# Patient Record
Sex: Female | Born: 1935
Health system: Southern US, Community
[De-identification: ages and names within clinical notes are randomized; demographics above are authoritative.]

## PROBLEM LIST (undated history)

## (undated) DIAGNOSIS — I219 Acute myocardial infarction, unspecified: Secondary | ICD-10-CM

## (undated) DIAGNOSIS — I10 Essential (primary) hypertension: Secondary | ICD-10-CM

## (undated) DIAGNOSIS — I639 Cerebral infarction, unspecified: Secondary | ICD-10-CM

## (undated) DIAGNOSIS — Z950 Presence of cardiac pacemaker: Secondary | ICD-10-CM

## (undated) HISTORY — PX: APPENDECTOMY: SHX54

## (undated) HISTORY — PX: TONSILLECTOMY: SUR1361

## (undated) HISTORY — DX: Acute myocardial infarction, unspecified: I21.9

## (undated) HISTORY — PX: ABDOMINAL HYSTERECTOMY: SHX81

---

## 2004-09-22 ENCOUNTER — Ambulatory Visit: Payer: Self-pay | Admitting: Internal Medicine

## 2004-11-10 ENCOUNTER — Ambulatory Visit: Payer: Self-pay | Admitting: Internal Medicine

## 2005-12-03 ENCOUNTER — Ambulatory Visit: Payer: Self-pay | Admitting: Internal Medicine

## 2006-03-03 ENCOUNTER — Ambulatory Visit: Payer: Self-pay | Admitting: Internal Medicine

## 2006-05-18 ENCOUNTER — Ambulatory Visit: Payer: Self-pay | Admitting: Gastroenterology

## 2006-12-07 ENCOUNTER — Ambulatory Visit: Payer: Self-pay | Admitting: Internal Medicine

## 2007-03-26 ENCOUNTER — Ambulatory Visit: Payer: Self-pay | Admitting: Internal Medicine

## 2007-10-06 DIAGNOSIS — I219 Acute myocardial infarction, unspecified: Secondary | ICD-10-CM

## 2007-10-06 HISTORY — DX: Acute myocardial infarction, unspecified: I21.9

## 2007-11-25 ENCOUNTER — Other Ambulatory Visit: Payer: Self-pay

## 2007-11-26 ENCOUNTER — Inpatient Hospital Stay: Payer: Self-pay | Admitting: Internal Medicine

## 2007-11-27 ENCOUNTER — Other Ambulatory Visit: Payer: Self-pay

## 2007-11-29 ENCOUNTER — Ambulatory Visit: Payer: Self-pay | Admitting: Cardiology

## 2008-03-15 ENCOUNTER — Ambulatory Visit: Payer: Self-pay | Admitting: Internal Medicine

## 2009-03-18 ENCOUNTER — Ambulatory Visit: Payer: Self-pay | Admitting: Internal Medicine

## 2010-03-20 ENCOUNTER — Ambulatory Visit: Payer: Self-pay | Admitting: Internal Medicine

## 2010-12-29 ENCOUNTER — Ambulatory Visit: Payer: Self-pay | Admitting: Family

## 2011-06-17 ENCOUNTER — Ambulatory Visit: Payer: Self-pay | Admitting: Internal Medicine

## 2011-11-19 DIAGNOSIS — H2510 Age-related nuclear cataract, unspecified eye: Secondary | ICD-10-CM | POA: Diagnosis not present

## 2011-12-07 DIAGNOSIS — I10 Essential (primary) hypertension: Secondary | ICD-10-CM | POA: Diagnosis not present

## 2011-12-07 DIAGNOSIS — I4891 Unspecified atrial fibrillation: Secondary | ICD-10-CM | POA: Diagnosis not present

## 2011-12-07 DIAGNOSIS — R197 Diarrhea, unspecified: Secondary | ICD-10-CM | POA: Diagnosis not present

## 2011-12-10 DIAGNOSIS — D485 Neoplasm of uncertain behavior of skin: Secondary | ICD-10-CM | POA: Diagnosis not present

## 2011-12-10 DIAGNOSIS — Z85828 Personal history of other malignant neoplasm of skin: Secondary | ICD-10-CM | POA: Diagnosis not present

## 2011-12-10 DIAGNOSIS — L570 Actinic keratosis: Secondary | ICD-10-CM | POA: Diagnosis not present

## 2011-12-10 DIAGNOSIS — D045 Carcinoma in situ of skin of trunk: Secondary | ICD-10-CM | POA: Diagnosis not present

## 2011-12-15 ENCOUNTER — Ambulatory Visit: Payer: Self-pay | Admitting: Ophthalmology

## 2011-12-15 DIAGNOSIS — Z8673 Personal history of transient ischemic attack (TIA), and cerebral infarction without residual deficits: Secondary | ICD-10-CM | POA: Diagnosis not present

## 2011-12-15 DIAGNOSIS — Z01812 Encounter for preprocedural laboratory examination: Secondary | ICD-10-CM | POA: Diagnosis not present

## 2011-12-15 DIAGNOSIS — I10 Essential (primary) hypertension: Secondary | ICD-10-CM | POA: Diagnosis not present

## 2011-12-15 DIAGNOSIS — E7800 Pure hypercholesterolemia, unspecified: Secondary | ICD-10-CM | POA: Diagnosis not present

## 2011-12-15 DIAGNOSIS — Z01810 Encounter for preprocedural cardiovascular examination: Secondary | ICD-10-CM | POA: Diagnosis not present

## 2011-12-15 DIAGNOSIS — Z79899 Other long term (current) drug therapy: Secondary | ICD-10-CM | POA: Diagnosis not present

## 2011-12-15 DIAGNOSIS — H2510 Age-related nuclear cataract, unspecified eye: Secondary | ICD-10-CM | POA: Diagnosis not present

## 2011-12-15 DIAGNOSIS — I252 Old myocardial infarction: Secondary | ICD-10-CM | POA: Diagnosis not present

## 2011-12-15 LAB — PROTIME-INR
INR: 2.4
Prothrombin Time: 26.3 secs — ABNORMAL HIGH (ref 11.5–14.7)

## 2011-12-15 LAB — HEMOGLOBIN: HGB: 13.9 g/dL (ref 12.0–16.0)

## 2011-12-25 DIAGNOSIS — C44519 Basal cell carcinoma of skin of other part of trunk: Secondary | ICD-10-CM | POA: Diagnosis not present

## 2011-12-28 ENCOUNTER — Ambulatory Visit: Payer: Self-pay | Admitting: Ophthalmology

## 2011-12-28 DIAGNOSIS — I252 Old myocardial infarction: Secondary | ICD-10-CM | POA: Diagnosis not present

## 2011-12-28 DIAGNOSIS — I498 Other specified cardiac arrhythmias: Secondary | ICD-10-CM | POA: Diagnosis not present

## 2011-12-28 DIAGNOSIS — I10 Essential (primary) hypertension: Secondary | ICD-10-CM | POA: Diagnosis not present

## 2011-12-28 DIAGNOSIS — Z79899 Other long term (current) drug therapy: Secondary | ICD-10-CM | POA: Diagnosis not present

## 2011-12-28 DIAGNOSIS — H2510 Age-related nuclear cataract, unspecified eye: Secondary | ICD-10-CM | POA: Diagnosis not present

## 2011-12-28 DIAGNOSIS — Z7901 Long term (current) use of anticoagulants: Secondary | ICD-10-CM | POA: Diagnosis not present

## 2011-12-28 DIAGNOSIS — K219 Gastro-esophageal reflux disease without esophagitis: Secondary | ICD-10-CM | POA: Diagnosis not present

## 2011-12-28 DIAGNOSIS — R42 Dizziness and giddiness: Secondary | ICD-10-CM | POA: Diagnosis not present

## 2011-12-28 DIAGNOSIS — D649 Anemia, unspecified: Secondary | ICD-10-CM | POA: Diagnosis not present

## 2011-12-28 DIAGNOSIS — E7800 Pure hypercholesterolemia, unspecified: Secondary | ICD-10-CM | POA: Diagnosis not present

## 2011-12-28 DIAGNOSIS — H269 Unspecified cataract: Secondary | ICD-10-CM | POA: Diagnosis not present

## 2011-12-28 DIAGNOSIS — R011 Cardiac murmur, unspecified: Secondary | ICD-10-CM | POA: Diagnosis not present

## 2012-02-16 DIAGNOSIS — I10 Essential (primary) hypertension: Secondary | ICD-10-CM | POA: Diagnosis not present

## 2012-02-16 DIAGNOSIS — E785 Hyperlipidemia, unspecified: Secondary | ICD-10-CM | POA: Diagnosis not present

## 2012-02-16 DIAGNOSIS — I4891 Unspecified atrial fibrillation: Secondary | ICD-10-CM | POA: Diagnosis not present

## 2012-02-18 ENCOUNTER — Ambulatory Visit: Payer: Self-pay | Admitting: Ophthalmology

## 2012-02-18 DIAGNOSIS — Z01812 Encounter for preprocedural laboratory examination: Secondary | ICD-10-CM | POA: Diagnosis not present

## 2012-02-18 DIAGNOSIS — Z7901 Long term (current) use of anticoagulants: Secondary | ICD-10-CM | POA: Diagnosis not present

## 2012-02-18 DIAGNOSIS — H2510 Age-related nuclear cataract, unspecified eye: Secondary | ICD-10-CM | POA: Diagnosis not present

## 2012-02-18 LAB — POTASSIUM: Potassium: 4.8 mmol/L (ref 3.5–5.1)

## 2012-02-18 LAB — PROTIME-INR: INR: 2.6

## 2012-03-14 ENCOUNTER — Ambulatory Visit: Payer: Self-pay | Admitting: Ophthalmology

## 2012-03-14 DIAGNOSIS — Z79899 Other long term (current) drug therapy: Secondary | ICD-10-CM | POA: Diagnosis not present

## 2012-03-14 DIAGNOSIS — R011 Cardiac murmur, unspecified: Secondary | ICD-10-CM | POA: Diagnosis not present

## 2012-03-14 DIAGNOSIS — Z85828 Personal history of other malignant neoplasm of skin: Secondary | ICD-10-CM | POA: Diagnosis not present

## 2012-03-14 DIAGNOSIS — H269 Unspecified cataract: Secondary | ICD-10-CM | POA: Diagnosis not present

## 2012-03-14 DIAGNOSIS — I10 Essential (primary) hypertension: Secondary | ICD-10-CM | POA: Diagnosis not present

## 2012-03-14 DIAGNOSIS — Z7901 Long term (current) use of anticoagulants: Secondary | ICD-10-CM | POA: Diagnosis not present

## 2012-03-14 DIAGNOSIS — I2510 Atherosclerotic heart disease of native coronary artery without angina pectoris: Secondary | ICD-10-CM | POA: Diagnosis not present

## 2012-03-14 DIAGNOSIS — K219 Gastro-esophageal reflux disease without esophagitis: Secondary | ICD-10-CM | POA: Diagnosis not present

## 2012-03-14 DIAGNOSIS — D649 Anemia, unspecified: Secondary | ICD-10-CM | POA: Diagnosis not present

## 2012-03-14 DIAGNOSIS — I252 Old myocardial infarction: Secondary | ICD-10-CM | POA: Diagnosis not present

## 2012-03-14 DIAGNOSIS — Z8673 Personal history of transient ischemic attack (TIA), and cerebral infarction without residual deficits: Secondary | ICD-10-CM | POA: Diagnosis not present

## 2012-03-14 DIAGNOSIS — E7800 Pure hypercholesterolemia, unspecified: Secondary | ICD-10-CM | POA: Diagnosis not present

## 2012-03-14 DIAGNOSIS — H2510 Age-related nuclear cataract, unspecified eye: Secondary | ICD-10-CM | POA: Diagnosis not present

## 2012-03-14 DIAGNOSIS — H9190 Unspecified hearing loss, unspecified ear: Secondary | ICD-10-CM | POA: Diagnosis not present

## 2012-03-16 DIAGNOSIS — E785 Hyperlipidemia, unspecified: Secondary | ICD-10-CM | POA: Diagnosis not present

## 2012-03-16 DIAGNOSIS — I4891 Unspecified atrial fibrillation: Secondary | ICD-10-CM | POA: Diagnosis not present

## 2012-03-16 DIAGNOSIS — I10 Essential (primary) hypertension: Secondary | ICD-10-CM | POA: Diagnosis not present

## 2012-04-12 DIAGNOSIS — I4891 Unspecified atrial fibrillation: Secondary | ICD-10-CM | POA: Diagnosis not present

## 2012-04-12 DIAGNOSIS — E785 Hyperlipidemia, unspecified: Secondary | ICD-10-CM | POA: Diagnosis not present

## 2012-04-12 DIAGNOSIS — I10 Essential (primary) hypertension: Secondary | ICD-10-CM | POA: Diagnosis not present

## 2012-04-12 DIAGNOSIS — E7800 Pure hypercholesterolemia, unspecified: Secondary | ICD-10-CM | POA: Diagnosis not present

## 2012-04-12 DIAGNOSIS — R7301 Impaired fasting glucose: Secondary | ICD-10-CM | POA: Diagnosis not present

## 2012-04-12 DIAGNOSIS — D649 Anemia, unspecified: Secondary | ICD-10-CM | POA: Diagnosis not present

## 2012-05-12 DIAGNOSIS — I10 Essential (primary) hypertension: Secondary | ICD-10-CM | POA: Diagnosis not present

## 2012-05-12 DIAGNOSIS — I4891 Unspecified atrial fibrillation: Secondary | ICD-10-CM | POA: Diagnosis not present

## 2012-05-12 DIAGNOSIS — R21 Rash and other nonspecific skin eruption: Secondary | ICD-10-CM | POA: Diagnosis not present

## 2012-05-20 DIAGNOSIS — Z85828 Personal history of other malignant neoplasm of skin: Secondary | ICD-10-CM | POA: Diagnosis not present

## 2012-05-20 DIAGNOSIS — L570 Actinic keratosis: Secondary | ICD-10-CM | POA: Diagnosis not present

## 2012-05-20 DIAGNOSIS — D045 Carcinoma in situ of skin of trunk: Secondary | ICD-10-CM | POA: Diagnosis not present

## 2012-05-20 DIAGNOSIS — D485 Neoplasm of uncertain behavior of skin: Secondary | ICD-10-CM | POA: Diagnosis not present

## 2012-06-16 DIAGNOSIS — I10 Essential (primary) hypertension: Secondary | ICD-10-CM | POA: Diagnosis not present

## 2012-06-16 DIAGNOSIS — I4891 Unspecified atrial fibrillation: Secondary | ICD-10-CM | POA: Diagnosis not present

## 2012-06-17 ENCOUNTER — Ambulatory Visit: Payer: Self-pay | Admitting: Internal Medicine

## 2012-06-17 DIAGNOSIS — Z1231 Encounter for screening mammogram for malignant neoplasm of breast: Secondary | ICD-10-CM | POA: Diagnosis not present

## 2012-07-01 DIAGNOSIS — L820 Inflamed seborrheic keratosis: Secondary | ICD-10-CM | POA: Diagnosis not present

## 2012-07-01 DIAGNOSIS — D0460 Carcinoma in situ of skin of unspecified upper limb, including shoulder: Secondary | ICD-10-CM | POA: Diagnosis not present

## 2012-07-14 DIAGNOSIS — E785 Hyperlipidemia, unspecified: Secondary | ICD-10-CM | POA: Diagnosis not present

## 2012-07-14 DIAGNOSIS — Z23 Encounter for immunization: Secondary | ICD-10-CM | POA: Diagnosis not present

## 2012-07-14 DIAGNOSIS — I129 Hypertensive chronic kidney disease with stage 1 through stage 4 chronic kidney disease, or unspecified chronic kidney disease: Secondary | ICD-10-CM | POA: Diagnosis not present

## 2012-07-14 DIAGNOSIS — I4891 Unspecified atrial fibrillation: Secondary | ICD-10-CM | POA: Diagnosis not present

## 2012-07-19 DIAGNOSIS — M9981 Other biomechanical lesions of cervical region: Secondary | ICD-10-CM | POA: Diagnosis not present

## 2012-07-19 DIAGNOSIS — M999 Biomechanical lesion, unspecified: Secondary | ICD-10-CM | POA: Diagnosis not present

## 2012-07-19 DIAGNOSIS — IMO0002 Reserved for concepts with insufficient information to code with codable children: Secondary | ICD-10-CM | POA: Diagnosis not present

## 2012-07-19 DIAGNOSIS — M5030 Other cervical disc degeneration, unspecified cervical region: Secondary | ICD-10-CM | POA: Diagnosis not present

## 2012-08-11 DIAGNOSIS — E785 Hyperlipidemia, unspecified: Secondary | ICD-10-CM | POA: Diagnosis not present

## 2012-08-11 DIAGNOSIS — I129 Hypertensive chronic kidney disease with stage 1 through stage 4 chronic kidney disease, or unspecified chronic kidney disease: Secondary | ICD-10-CM | POA: Diagnosis not present

## 2012-08-11 DIAGNOSIS — I4891 Unspecified atrial fibrillation: Secondary | ICD-10-CM | POA: Diagnosis not present

## 2012-08-23 ENCOUNTER — Observation Stay: Payer: Self-pay | Admitting: Internal Medicine

## 2012-08-23 DIAGNOSIS — Z7901 Long term (current) use of anticoagulants: Secondary | ICD-10-CM | POA: Diagnosis not present

## 2012-08-23 DIAGNOSIS — Q211 Atrial septal defect: Secondary | ICD-10-CM | POA: Diagnosis not present

## 2012-08-23 DIAGNOSIS — I959 Hypotension, unspecified: Secondary | ICD-10-CM | POA: Diagnosis not present

## 2012-08-23 DIAGNOSIS — E785 Hyperlipidemia, unspecified: Secondary | ICD-10-CM | POA: Diagnosis not present

## 2012-08-23 DIAGNOSIS — I10 Essential (primary) hypertension: Secondary | ICD-10-CM | POA: Diagnosis not present

## 2012-08-23 DIAGNOSIS — I2510 Atherosclerotic heart disease of native coronary artery without angina pectoris: Secondary | ICD-10-CM | POA: Diagnosis not present

## 2012-08-23 DIAGNOSIS — R404 Transient alteration of awareness: Secondary | ICD-10-CM | POA: Diagnosis not present

## 2012-08-23 DIAGNOSIS — R0602 Shortness of breath: Secondary | ICD-10-CM | POA: Diagnosis not present

## 2012-08-23 DIAGNOSIS — Z66 Do not resuscitate: Secondary | ICD-10-CM | POA: Diagnosis not present

## 2012-08-23 DIAGNOSIS — I059 Rheumatic mitral valve disease, unspecified: Secondary | ICD-10-CM | POA: Diagnosis not present

## 2012-08-23 DIAGNOSIS — I498 Other specified cardiac arrhythmias: Secondary | ICD-10-CM | POA: Diagnosis not present

## 2012-08-23 DIAGNOSIS — R42 Dizziness and giddiness: Secondary | ICD-10-CM | POA: Diagnosis not present

## 2012-08-23 DIAGNOSIS — Z79899 Other long term (current) drug therapy: Secondary | ICD-10-CM | POA: Diagnosis not present

## 2012-08-23 DIAGNOSIS — I129 Hypertensive chronic kidney disease with stage 1 through stage 4 chronic kidney disease, or unspecified chronic kidney disease: Secondary | ICD-10-CM | POA: Diagnosis not present

## 2012-08-23 DIAGNOSIS — I252 Old myocardial infarction: Secondary | ICD-10-CM | POA: Diagnosis not present

## 2012-08-23 DIAGNOSIS — Z8673 Personal history of transient ischemic attack (TIA), and cerebral infarction without residual deficits: Secondary | ICD-10-CM | POA: Diagnosis not present

## 2012-08-23 LAB — CBC
HCT: 40.2 % (ref 35.0–47.0)
HGB: 13.7 g/dL (ref 12.0–16.0)
MCHC: 34.1 g/dL (ref 32.0–36.0)
MCV: 94 fL (ref 80–100)
Platelet: 221 10*3/uL (ref 150–440)
RBC: 4.27 10*6/uL (ref 3.80–5.20)
RDW: 13.3 % (ref 11.5–14.5)
WBC: 8.3 10*3/uL (ref 3.6–11.0)

## 2012-08-23 LAB — COMPREHENSIVE METABOLIC PANEL
Anion Gap: 2 — ABNORMAL LOW (ref 7–16)
Bilirubin,Total: 0.6 mg/dL (ref 0.2–1.0)
Calcium, Total: 9.8 mg/dL (ref 8.5–10.1)
Chloride: 106 mmol/L (ref 98–107)
Co2: 30 mmol/L (ref 21–32)
Creatinine: 1.24 mg/dL (ref 0.60–1.30)
EGFR (African American): 46 — ABNORMAL LOW
EGFR (Non-African Amer.): 40 — ABNORMAL LOW
Osmolality: 280 (ref 275–301)
Potassium: 4.9 mmol/L (ref 3.5–5.1)
SGPT (ALT): 31 U/L (ref 12–78)

## 2012-08-23 LAB — URINALYSIS, COMPLETE
Bacteria: NONE SEEN
Bilirubin,UR: NEGATIVE
Blood: NEGATIVE
Hyaline Cast: 6
Nitrite: NEGATIVE
Ph: 8 (ref 4.5–8.0)
Protein: NEGATIVE
RBC,UR: 6 /HPF (ref 0–5)

## 2012-08-23 LAB — PROTIME-INR: INR: 1.8

## 2012-08-23 LAB — TROPONIN I: Troponin-I: <0.02 ng/mL

## 2012-08-23 LAB — CK TOTAL AND CKMB (NOT AT ARMC): CK, Total: 102 U/L (ref 21–215)

## 2012-08-24 DIAGNOSIS — I959 Hypotension, unspecified: Secondary | ICD-10-CM | POA: Diagnosis not present

## 2012-08-24 DIAGNOSIS — I498 Other specified cardiac arrhythmias: Secondary | ICD-10-CM | POA: Diagnosis not present

## 2012-08-24 DIAGNOSIS — R42 Dizziness and giddiness: Secondary | ICD-10-CM | POA: Diagnosis not present

## 2012-08-24 LAB — BASIC METABOLIC PANEL
Anion Gap: 6 — ABNORMAL LOW (ref 7–16)
BUN: 21 mg/dL — ABNORMAL HIGH (ref 7–18)
Calcium, Total: 8.6 mg/dL (ref 8.5–10.1)
Co2: 26 mmol/L (ref 21–32)
Creatinine: 1.22 mg/dL (ref 0.60–1.30)
EGFR (African American): 47 — ABNORMAL LOW
EGFR (Non-African Amer.): 41 — ABNORMAL LOW
Glucose: 99 mg/dL (ref 65–99)
Potassium: 4.3 mmol/L (ref 3.5–5.1)
Sodium: 140 mmol/L (ref 136–145)

## 2012-08-24 LAB — CBC WITH DIFFERENTIAL/PLATELET
Eosinophil #: 0.2 10*3/uL (ref 0.0–0.7)
HGB: 12.1 g/dL (ref 12.0–16.0)
Lymphocyte #: 2.5 10*3/uL (ref 1.0–3.6)
MCH: 32.2 pg (ref 26.0–34.0)
MCHC: 33.8 g/dL (ref 32.0–36.0)
Monocyte #: 0.6 x10 3/mm (ref 0.2–0.9)
Monocyte %: 7.8 %
Neutrophil %: 53.1 %
Platelet: 198 10*3/uL (ref 150–440)
RDW: 13.4 % (ref 11.5–14.5)
WBC: 7.1 10*3/uL (ref 3.6–11.0)

## 2012-08-30 DIAGNOSIS — R7301 Impaired fasting glucose: Secondary | ICD-10-CM | POA: Diagnosis not present

## 2012-08-30 DIAGNOSIS — I4891 Unspecified atrial fibrillation: Secondary | ICD-10-CM | POA: Diagnosis not present

## 2012-09-05 DIAGNOSIS — I498 Other specified cardiac arrhythmias: Secondary | ICD-10-CM | POA: Diagnosis not present

## 2012-09-09 DIAGNOSIS — R0989 Other specified symptoms and signs involving the circulatory and respiratory systems: Secondary | ICD-10-CM | POA: Diagnosis not present

## 2012-09-09 DIAGNOSIS — R55 Syncope and collapse: Secondary | ICD-10-CM | POA: Diagnosis not present

## 2012-09-09 DIAGNOSIS — I6529 Occlusion and stenosis of unspecified carotid artery: Secondary | ICD-10-CM | POA: Diagnosis not present

## 2012-09-16 DIAGNOSIS — I4891 Unspecified atrial fibrillation: Secondary | ICD-10-CM | POA: Diagnosis not present

## 2012-09-16 DIAGNOSIS — I129 Hypertensive chronic kidney disease with stage 1 through stage 4 chronic kidney disease, or unspecified chronic kidney disease: Secondary | ICD-10-CM | POA: Diagnosis not present

## 2012-10-07 DIAGNOSIS — I4891 Unspecified atrial fibrillation: Secondary | ICD-10-CM | POA: Diagnosis not present

## 2012-10-07 DIAGNOSIS — H1045 Other chronic allergic conjunctivitis: Secondary | ICD-10-CM | POA: Diagnosis not present

## 2012-10-21 DIAGNOSIS — I4891 Unspecified atrial fibrillation: Secondary | ICD-10-CM | POA: Diagnosis not present

## 2012-10-21 DIAGNOSIS — E785 Hyperlipidemia, unspecified: Secondary | ICD-10-CM | POA: Diagnosis not present

## 2012-10-21 DIAGNOSIS — I129 Hypertensive chronic kidney disease with stage 1 through stage 4 chronic kidney disease, or unspecified chronic kidney disease: Secondary | ICD-10-CM | POA: Diagnosis not present

## 2012-11-23 DIAGNOSIS — Z85828 Personal history of other malignant neoplasm of skin: Secondary | ICD-10-CM | POA: Diagnosis not present

## 2012-11-23 DIAGNOSIS — D485 Neoplasm of uncertain behavior of skin: Secondary | ICD-10-CM | POA: Diagnosis not present

## 2012-11-23 DIAGNOSIS — D0470 Carcinoma in situ of skin of unspecified lower limb, including hip: Secondary | ICD-10-CM | POA: Diagnosis not present

## 2012-11-23 DIAGNOSIS — L570 Actinic keratosis: Secondary | ICD-10-CM | POA: Diagnosis not present

## 2012-11-25 DIAGNOSIS — I4891 Unspecified atrial fibrillation: Secondary | ICD-10-CM | POA: Diagnosis not present

## 2012-11-25 DIAGNOSIS — I129 Hypertensive chronic kidney disease with stage 1 through stage 4 chronic kidney disease, or unspecified chronic kidney disease: Secondary | ICD-10-CM | POA: Diagnosis not present

## 2012-11-25 DIAGNOSIS — E785 Hyperlipidemia, unspecified: Secondary | ICD-10-CM | POA: Diagnosis not present

## 2012-12-23 DIAGNOSIS — E785 Hyperlipidemia, unspecified: Secondary | ICD-10-CM | POA: Diagnosis not present

## 2012-12-23 DIAGNOSIS — I4891 Unspecified atrial fibrillation: Secondary | ICD-10-CM | POA: Diagnosis not present

## 2012-12-23 DIAGNOSIS — I129 Hypertensive chronic kidney disease with stage 1 through stage 4 chronic kidney disease, or unspecified chronic kidney disease: Secondary | ICD-10-CM | POA: Diagnosis not present

## 2012-12-30 DIAGNOSIS — H2510 Age-related nuclear cataract, unspecified eye: Secondary | ICD-10-CM | POA: Diagnosis not present

## 2012-12-30 DIAGNOSIS — Z961 Presence of intraocular lens: Secondary | ICD-10-CM | POA: Diagnosis not present

## 2013-01-31 DIAGNOSIS — I129 Hypertensive chronic kidney disease with stage 1 through stage 4 chronic kidney disease, or unspecified chronic kidney disease: Secondary | ICD-10-CM | POA: Diagnosis not present

## 2013-01-31 DIAGNOSIS — I4891 Unspecified atrial fibrillation: Secondary | ICD-10-CM | POA: Diagnosis not present

## 2013-01-31 DIAGNOSIS — E785 Hyperlipidemia, unspecified: Secondary | ICD-10-CM | POA: Diagnosis not present

## 2013-03-02 DIAGNOSIS — I4891 Unspecified atrial fibrillation: Secondary | ICD-10-CM | POA: Diagnosis not present

## 2013-03-02 DIAGNOSIS — E785 Hyperlipidemia, unspecified: Secondary | ICD-10-CM | POA: Diagnosis not present

## 2013-03-02 DIAGNOSIS — I129 Hypertensive chronic kidney disease with stage 1 through stage 4 chronic kidney disease, or unspecified chronic kidney disease: Secondary | ICD-10-CM | POA: Diagnosis not present

## 2013-03-31 DIAGNOSIS — D239 Other benign neoplasm of skin, unspecified: Secondary | ICD-10-CM | POA: Diagnosis not present

## 2013-03-31 DIAGNOSIS — E785 Hyperlipidemia, unspecified: Secondary | ICD-10-CM | POA: Diagnosis not present

## 2013-03-31 DIAGNOSIS — I129 Hypertensive chronic kidney disease with stage 1 through stage 4 chronic kidney disease, or unspecified chronic kidney disease: Secondary | ICD-10-CM | POA: Diagnosis not present

## 2013-03-31 DIAGNOSIS — I4891 Unspecified atrial fibrillation: Secondary | ICD-10-CM | POA: Diagnosis not present

## 2013-04-20 DIAGNOSIS — I129 Hypertensive chronic kidney disease with stage 1 through stage 4 chronic kidney disease, or unspecified chronic kidney disease: Secondary | ICD-10-CM | POA: Diagnosis not present

## 2013-04-27 DIAGNOSIS — Z789 Other specified health status: Secondary | ICD-10-CM | POA: Diagnosis not present

## 2013-04-27 DIAGNOSIS — I129 Hypertensive chronic kidney disease with stage 1 through stage 4 chronic kidney disease, or unspecified chronic kidney disease: Secondary | ICD-10-CM | POA: Diagnosis not present

## 2013-04-27 DIAGNOSIS — I4891 Unspecified atrial fibrillation: Secondary | ICD-10-CM | POA: Diagnosis not present

## 2013-05-25 DIAGNOSIS — L570 Actinic keratosis: Secondary | ICD-10-CM | POA: Diagnosis not present

## 2013-05-25 DIAGNOSIS — L130 Dermatitis herpetiformis: Secondary | ICD-10-CM | POA: Diagnosis not present

## 2013-05-25 DIAGNOSIS — Z85828 Personal history of other malignant neoplasm of skin: Secondary | ICD-10-CM | POA: Diagnosis not present

## 2013-05-25 DIAGNOSIS — D485 Neoplasm of uncertain behavior of skin: Secondary | ICD-10-CM | POA: Diagnosis not present

## 2013-05-25 DIAGNOSIS — C44621 Squamous cell carcinoma of skin of unspecified upper limb, including shoulder: Secondary | ICD-10-CM | POA: Diagnosis not present

## 2013-05-30 DIAGNOSIS — I4891 Unspecified atrial fibrillation: Secondary | ICD-10-CM | POA: Diagnosis not present

## 2013-05-30 DIAGNOSIS — I129 Hypertensive chronic kidney disease with stage 1 through stage 4 chronic kidney disease, or unspecified chronic kidney disease: Secondary | ICD-10-CM | POA: Diagnosis not present

## 2013-05-30 DIAGNOSIS — C4490 Unspecified malignant neoplasm of skin, unspecified: Secondary | ICD-10-CM | POA: Diagnosis not present

## 2013-06-01 DIAGNOSIS — D0460 Carcinoma in situ of skin of unspecified upper limb, including shoulder: Secondary | ICD-10-CM | POA: Diagnosis not present

## 2013-06-23 ENCOUNTER — Ambulatory Visit: Payer: Self-pay | Admitting: Family

## 2013-06-23 DIAGNOSIS — Z1231 Encounter for screening mammogram for malignant neoplasm of breast: Secondary | ICD-10-CM | POA: Diagnosis not present

## 2013-06-30 DIAGNOSIS — L570 Actinic keratosis: Secondary | ICD-10-CM | POA: Diagnosis not present

## 2013-07-04 DIAGNOSIS — Z23 Encounter for immunization: Secondary | ICD-10-CM | POA: Diagnosis not present

## 2013-07-04 DIAGNOSIS — I129 Hypertensive chronic kidney disease with stage 1 through stage 4 chronic kidney disease, or unspecified chronic kidney disease: Secondary | ICD-10-CM | POA: Diagnosis not present

## 2013-07-04 DIAGNOSIS — I4891 Unspecified atrial fibrillation: Secondary | ICD-10-CM | POA: Diagnosis not present

## 2013-07-04 DIAGNOSIS — Z833 Family history of diabetes mellitus: Secondary | ICD-10-CM | POA: Diagnosis not present

## 2013-07-28 DIAGNOSIS — L570 Actinic keratosis: Secondary | ICD-10-CM | POA: Diagnosis not present

## 2013-08-04 DIAGNOSIS — L820 Inflamed seborrheic keratosis: Secondary | ICD-10-CM | POA: Diagnosis not present

## 2013-08-08 DIAGNOSIS — Z789 Other specified health status: Secondary | ICD-10-CM | POA: Diagnosis not present

## 2013-08-08 DIAGNOSIS — I4891 Unspecified atrial fibrillation: Secondary | ICD-10-CM | POA: Diagnosis not present

## 2013-09-07 DIAGNOSIS — I129 Hypertensive chronic kidney disease with stage 1 through stage 4 chronic kidney disease, or unspecified chronic kidney disease: Secondary | ICD-10-CM | POA: Diagnosis not present

## 2013-09-07 DIAGNOSIS — E785 Hyperlipidemia, unspecified: Secondary | ICD-10-CM | POA: Diagnosis not present

## 2013-09-07 DIAGNOSIS — I4891 Unspecified atrial fibrillation: Secondary | ICD-10-CM | POA: Diagnosis not present

## 2013-09-15 ENCOUNTER — Emergency Department: Payer: Self-pay | Admitting: Emergency Medicine

## 2013-09-15 DIAGNOSIS — S025XXA Fracture of tooth (traumatic), initial encounter for closed fracture: Secondary | ICD-10-CM | POA: Diagnosis not present

## 2013-09-15 DIAGNOSIS — S52123A Displaced fracture of head of unspecified radius, initial encounter for closed fracture: Secondary | ICD-10-CM | POA: Diagnosis not present

## 2013-09-15 DIAGNOSIS — M25569 Pain in unspecified knee: Secondary | ICD-10-CM | POA: Diagnosis not present

## 2013-09-15 DIAGNOSIS — Z79899 Other long term (current) drug therapy: Secondary | ICD-10-CM | POA: Diagnosis not present

## 2013-09-15 DIAGNOSIS — R42 Dizziness and giddiness: Secondary | ICD-10-CM | POA: Diagnosis not present

## 2013-09-15 DIAGNOSIS — S8990XA Unspecified injury of unspecified lower leg, initial encounter: Secondary | ICD-10-CM | POA: Diagnosis not present

## 2013-09-15 DIAGNOSIS — S0990XA Unspecified injury of head, initial encounter: Secondary | ICD-10-CM | POA: Diagnosis not present

## 2013-09-15 DIAGNOSIS — R197 Diarrhea, unspecified: Secondary | ICD-10-CM | POA: Diagnosis not present

## 2013-09-15 DIAGNOSIS — S5000XA Contusion of unspecified elbow, initial encounter: Secondary | ICD-10-CM | POA: Diagnosis not present

## 2013-09-15 DIAGNOSIS — S01501A Unspecified open wound of lip, initial encounter: Secondary | ICD-10-CM | POA: Diagnosis not present

## 2013-09-15 DIAGNOSIS — S4980XA Other specified injuries of shoulder and upper arm, unspecified arm, initial encounter: Secondary | ICD-10-CM | POA: Diagnosis not present

## 2013-09-15 DIAGNOSIS — I10 Essential (primary) hypertension: Secondary | ICD-10-CM | POA: Diagnosis not present

## 2013-09-15 DIAGNOSIS — M25519 Pain in unspecified shoulder: Secondary | ICD-10-CM | POA: Diagnosis not present

## 2013-09-15 DIAGNOSIS — S52109A Unspecified fracture of upper end of unspecified radius, initial encounter for closed fracture: Secondary | ICD-10-CM | POA: Diagnosis not present

## 2013-09-19 DIAGNOSIS — S52023A Displaced fracture of olecranon process without intraarticular extension of unspecified ulna, initial encounter for closed fracture: Secondary | ICD-10-CM | POA: Diagnosis not present

## 2013-10-04 DIAGNOSIS — S52023A Displaced fracture of olecranon process without intraarticular extension of unspecified ulna, initial encounter for closed fracture: Secondary | ICD-10-CM | POA: Diagnosis not present

## 2013-10-11 DIAGNOSIS — S42309S Unspecified fracture of shaft of humerus, unspecified arm, sequela: Secondary | ICD-10-CM | POA: Diagnosis not present

## 2013-10-11 DIAGNOSIS — N183 Chronic kidney disease, stage 3 unspecified: Secondary | ICD-10-CM | POA: Diagnosis not present

## 2013-10-11 DIAGNOSIS — I129 Hypertensive chronic kidney disease with stage 1 through stage 4 chronic kidney disease, or unspecified chronic kidney disease: Secondary | ICD-10-CM | POA: Diagnosis not present

## 2013-10-11 DIAGNOSIS — I4891 Unspecified atrial fibrillation: Secondary | ICD-10-CM | POA: Diagnosis not present

## 2013-11-16 DIAGNOSIS — N183 Chronic kidney disease, stage 3 unspecified: Secondary | ICD-10-CM | POA: Diagnosis not present

## 2013-11-16 DIAGNOSIS — I129 Hypertensive chronic kidney disease with stage 1 through stage 4 chronic kidney disease, or unspecified chronic kidney disease: Secondary | ICD-10-CM | POA: Diagnosis not present

## 2013-11-16 DIAGNOSIS — S42309S Unspecified fracture of shaft of humerus, unspecified arm, sequela: Secondary | ICD-10-CM | POA: Diagnosis not present

## 2013-11-16 DIAGNOSIS — Z7901 Long term (current) use of anticoagulants: Secondary | ICD-10-CM | POA: Diagnosis not present

## 2013-11-16 DIAGNOSIS — I4891 Unspecified atrial fibrillation: Secondary | ICD-10-CM | POA: Diagnosis not present

## 2013-12-19 DIAGNOSIS — S52023A Displaced fracture of olecranon process without intraarticular extension of unspecified ulna, initial encounter for closed fracture: Secondary | ICD-10-CM | POA: Diagnosis not present

## 2013-12-19 DIAGNOSIS — M67919 Unspecified disorder of synovium and tendon, unspecified shoulder: Secondary | ICD-10-CM | POA: Diagnosis not present

## 2013-12-27 DIAGNOSIS — Z85828 Personal history of other malignant neoplasm of skin: Secondary | ICD-10-CM | POA: Diagnosis not present

## 2013-12-27 DIAGNOSIS — L570 Actinic keratosis: Secondary | ICD-10-CM | POA: Diagnosis not present

## 2014-01-02 DIAGNOSIS — I4891 Unspecified atrial fibrillation: Secondary | ICD-10-CM | POA: Diagnosis not present

## 2014-01-02 DIAGNOSIS — Z7901 Long term (current) use of anticoagulants: Secondary | ICD-10-CM | POA: Diagnosis not present

## 2014-02-06 DIAGNOSIS — D638 Anemia in other chronic diseases classified elsewhere: Secondary | ICD-10-CM | POA: Diagnosis not present

## 2014-02-06 DIAGNOSIS — I209 Angina pectoris, unspecified: Secondary | ICD-10-CM | POA: Diagnosis not present

## 2014-02-06 DIAGNOSIS — I4891 Unspecified atrial fibrillation: Secondary | ICD-10-CM | POA: Diagnosis not present

## 2014-02-06 DIAGNOSIS — I10 Essential (primary) hypertension: Secondary | ICD-10-CM | POA: Diagnosis not present

## 2014-03-09 DIAGNOSIS — I209 Angina pectoris, unspecified: Secondary | ICD-10-CM | POA: Diagnosis not present

## 2014-03-09 DIAGNOSIS — E8881 Metabolic syndrome: Secondary | ICD-10-CM | POA: Diagnosis not present

## 2014-03-09 DIAGNOSIS — I10 Essential (primary) hypertension: Secondary | ICD-10-CM | POA: Diagnosis not present

## 2014-03-09 DIAGNOSIS — I4891 Unspecified atrial fibrillation: Secondary | ICD-10-CM | POA: Diagnosis not present

## 2014-03-09 DIAGNOSIS — Z7901 Long term (current) use of anticoagulants: Secondary | ICD-10-CM | POA: Diagnosis not present

## 2014-03-15 DIAGNOSIS — L259 Unspecified contact dermatitis, unspecified cause: Secondary | ICD-10-CM | POA: Diagnosis not present

## 2014-03-15 DIAGNOSIS — L570 Actinic keratosis: Secondary | ICD-10-CM | POA: Diagnosis not present

## 2014-03-15 DIAGNOSIS — L821 Other seborrheic keratosis: Secondary | ICD-10-CM | POA: Diagnosis not present

## 2014-04-05 DIAGNOSIS — I10 Essential (primary) hypertension: Secondary | ICD-10-CM | POA: Diagnosis not present

## 2014-04-05 DIAGNOSIS — I4891 Unspecified atrial fibrillation: Secondary | ICD-10-CM | POA: Diagnosis not present

## 2014-04-05 DIAGNOSIS — I129 Hypertensive chronic kidney disease with stage 1 through stage 4 chronic kidney disease, or unspecified chronic kidney disease: Secondary | ICD-10-CM | POA: Diagnosis not present

## 2014-04-05 DIAGNOSIS — E8881 Metabolic syndrome: Secondary | ICD-10-CM | POA: Diagnosis not present

## 2014-05-07 DIAGNOSIS — I209 Angina pectoris, unspecified: Secondary | ICD-10-CM | POA: Diagnosis not present

## 2014-05-07 DIAGNOSIS — I4891 Unspecified atrial fibrillation: Secondary | ICD-10-CM | POA: Diagnosis not present

## 2014-05-07 DIAGNOSIS — E8881 Metabolic syndrome: Secondary | ICD-10-CM | POA: Diagnosis not present

## 2014-05-07 DIAGNOSIS — I10 Essential (primary) hypertension: Secondary | ICD-10-CM | POA: Diagnosis not present

## 2014-05-07 DIAGNOSIS — Z7901 Long term (current) use of anticoagulants: Secondary | ICD-10-CM | POA: Diagnosis not present

## 2014-06-07 DIAGNOSIS — I209 Angina pectoris, unspecified: Secondary | ICD-10-CM | POA: Diagnosis not present

## 2014-06-07 DIAGNOSIS — E785 Hyperlipidemia, unspecified: Secondary | ICD-10-CM | POA: Diagnosis not present

## 2014-06-07 DIAGNOSIS — I4891 Unspecified atrial fibrillation: Secondary | ICD-10-CM | POA: Diagnosis not present

## 2014-06-21 DIAGNOSIS — Z23 Encounter for immunization: Secondary | ICD-10-CM | POA: Diagnosis not present

## 2014-06-22 IMAGING — CR DG KNEE COMPLETE 4+V*L*
1 series · 4 of 4 positions shown · non-contrast
Comparison: None.

CLINICAL DATA: Left knee pain after fall.

EXAM:
LEFT KNEE - COMPLETE 4+ VIEW

[Series 1: x knee ap left · 0.14mm/px · 4 of 4 slices shown]
[im 1/4]
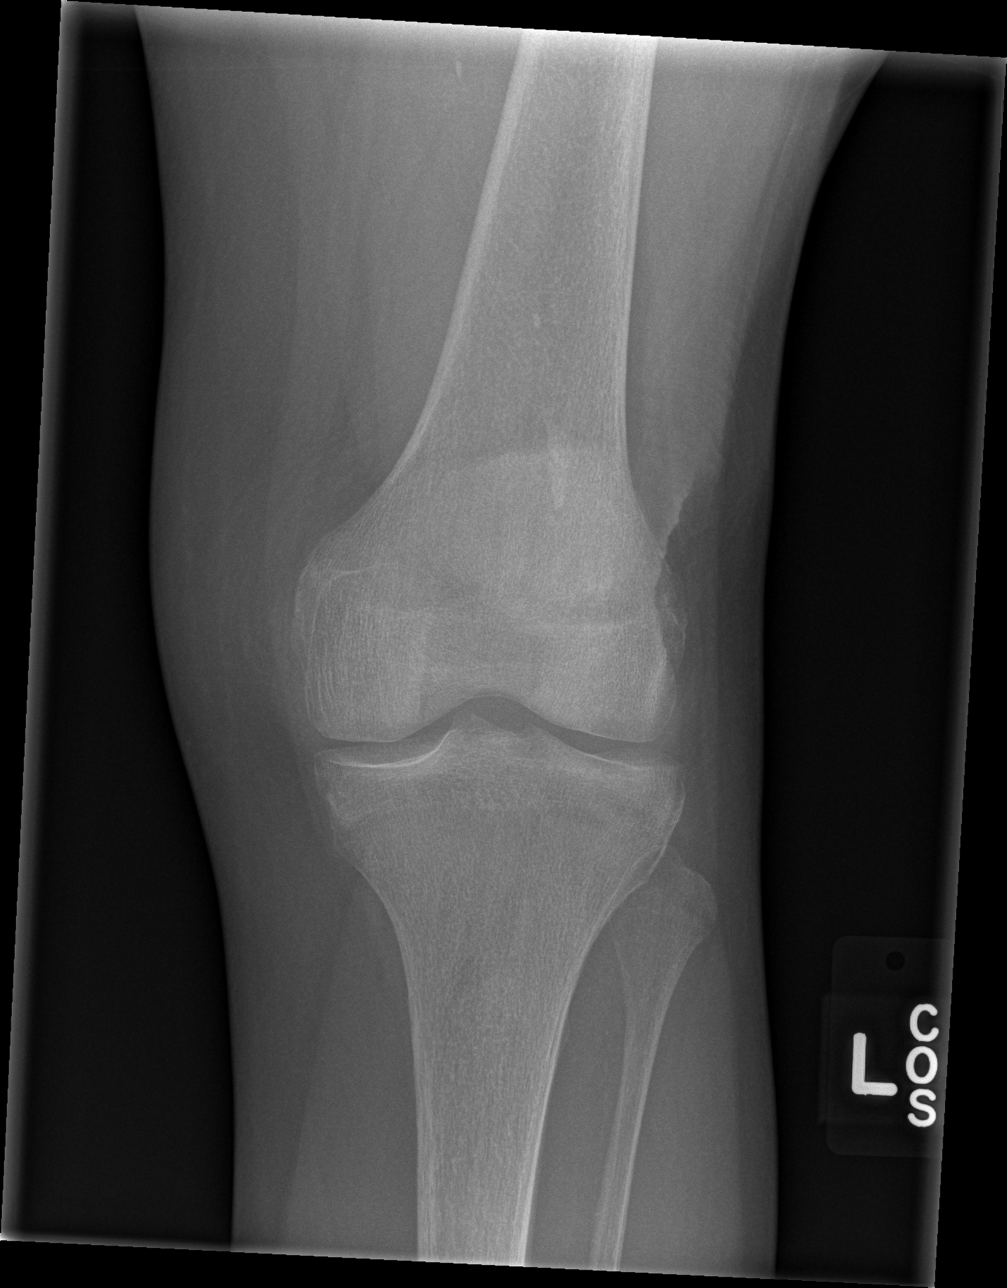
[im 2/4]
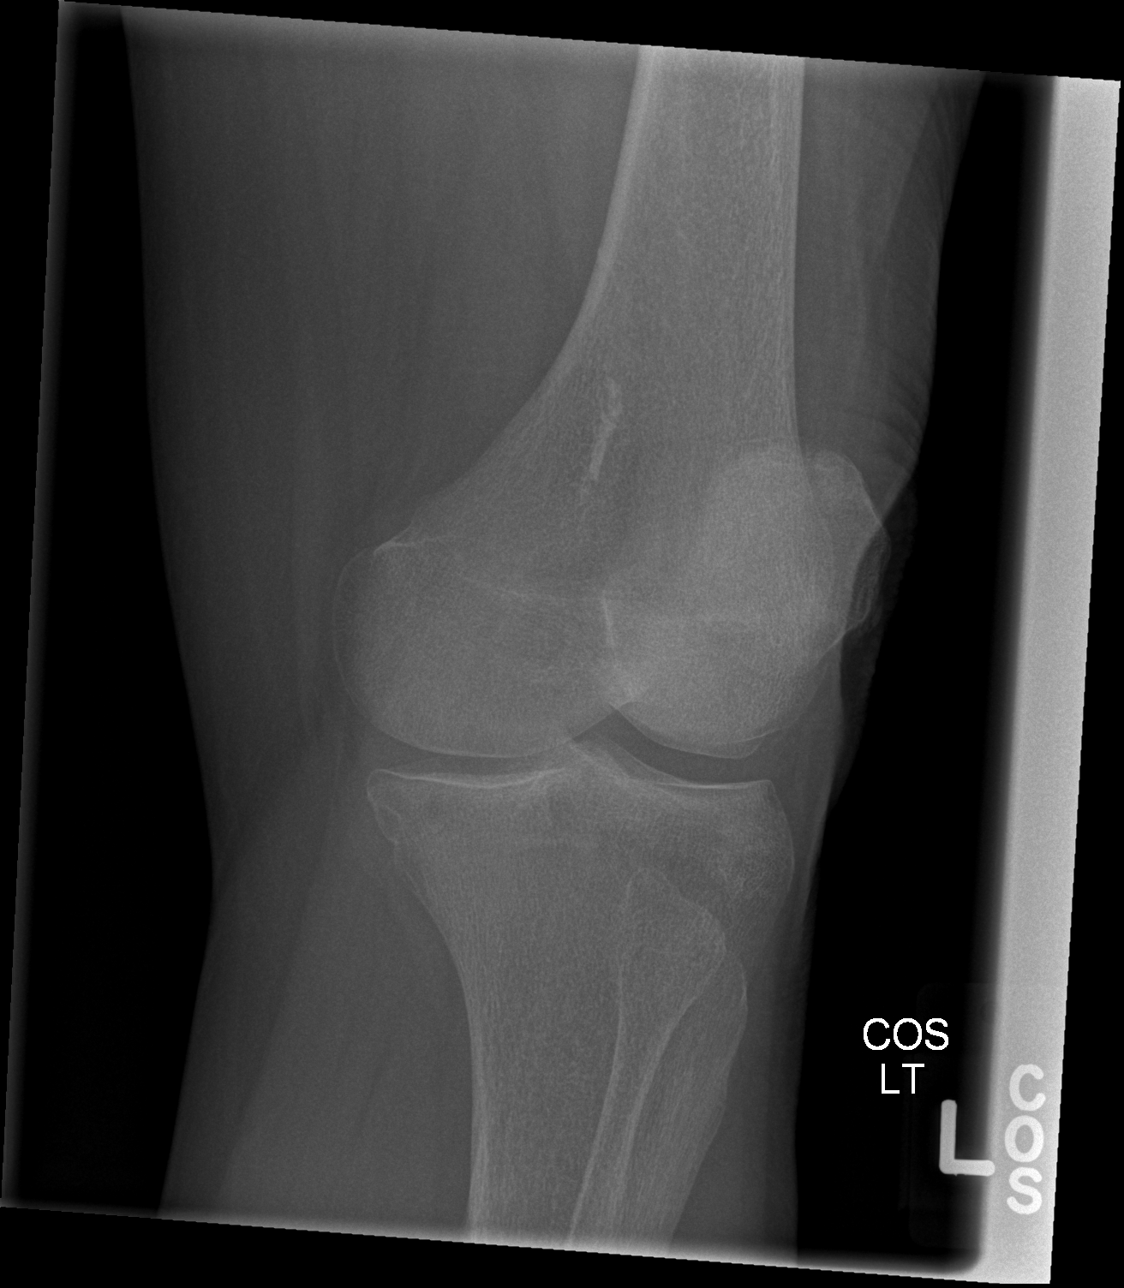
[im 3/4]
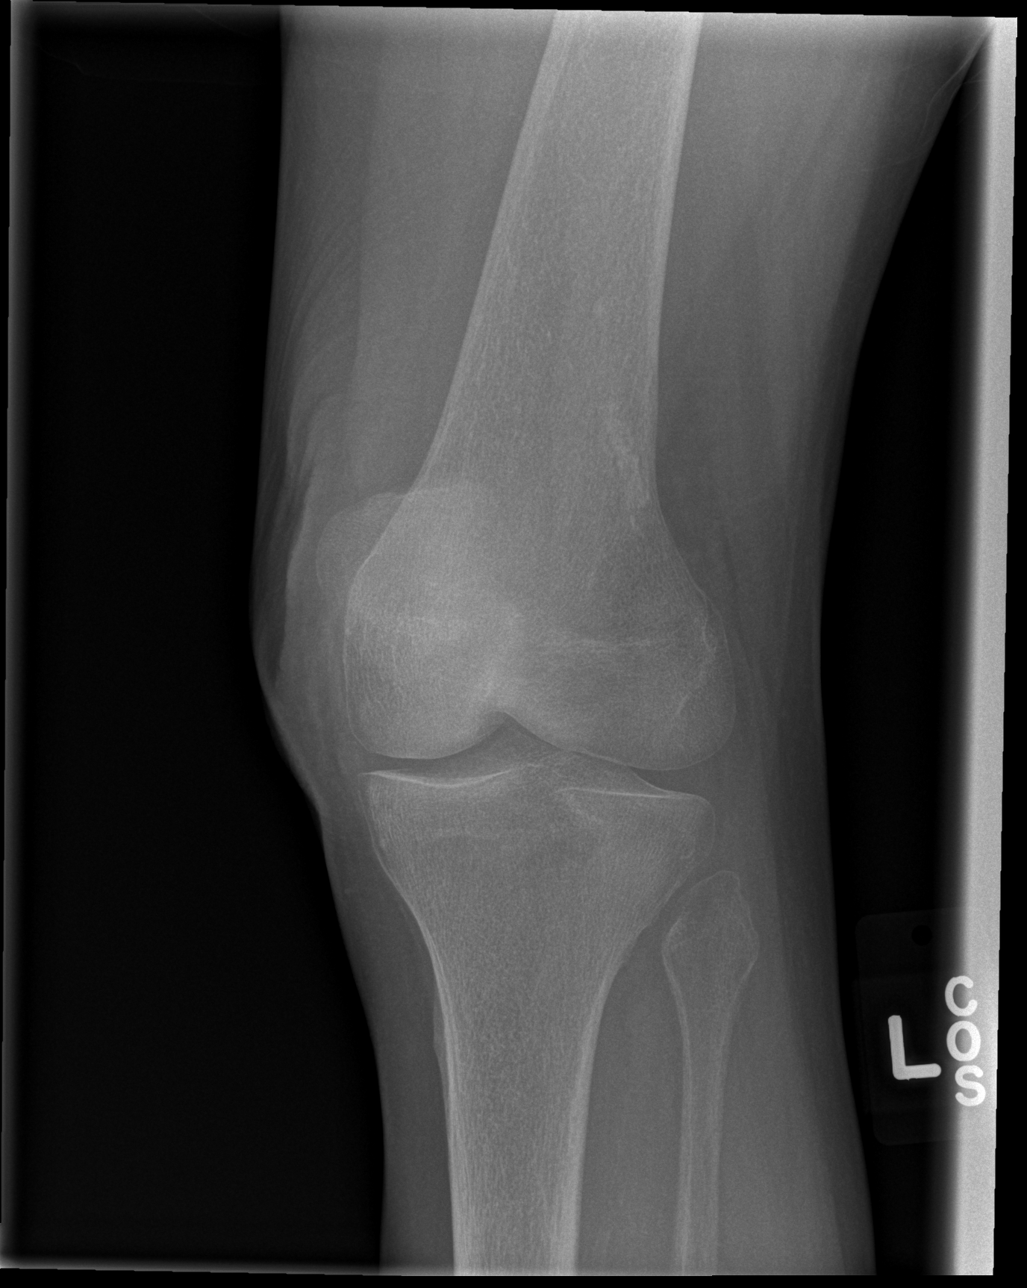
[im 4/4]
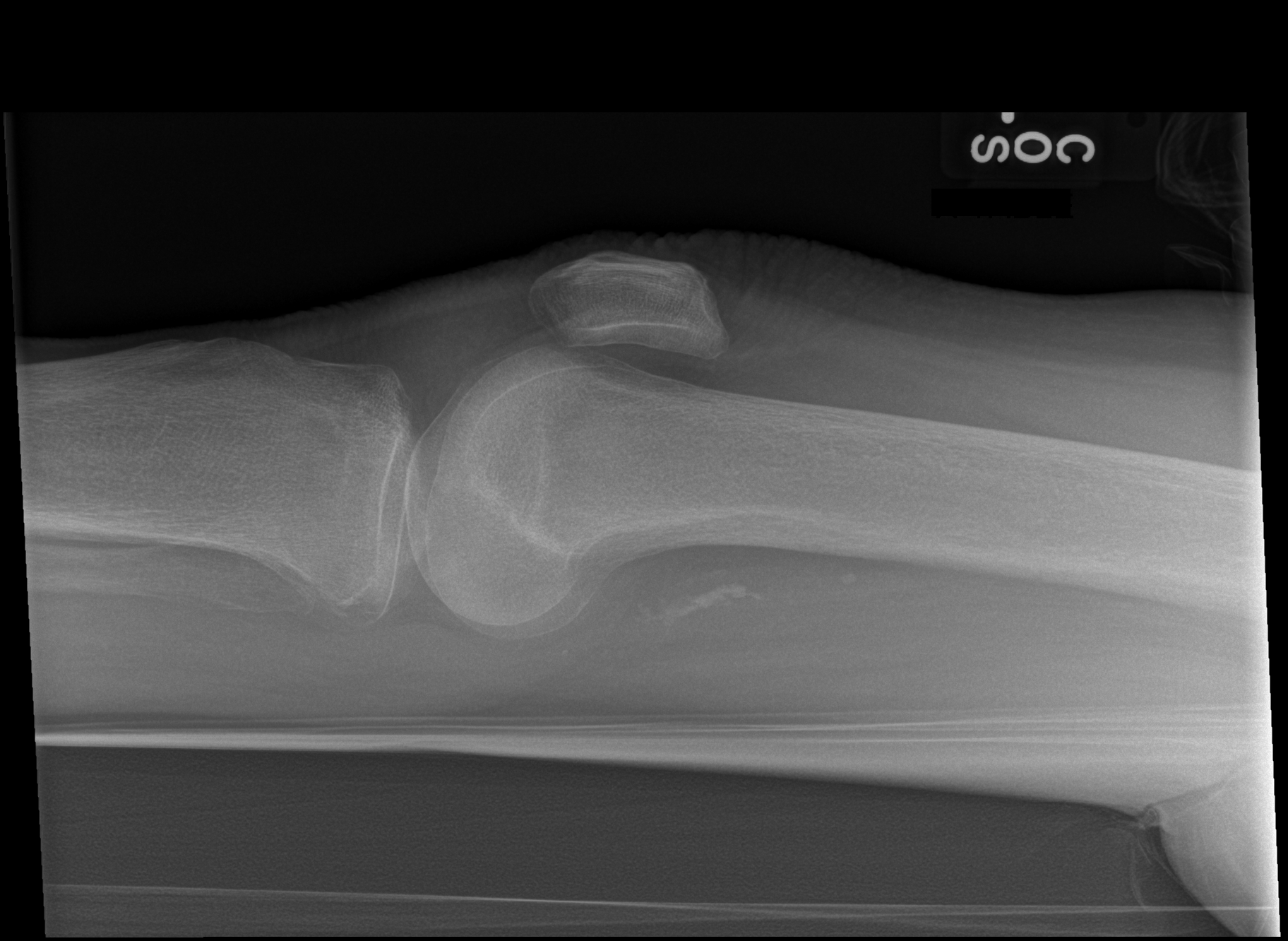

[4 of 4 positions shown; findings below may reference images not displayed]

FINDINGS: There is no evidence of fracture, dislocation, or joint effusion.
Mild osteopenia is noted. There is no evidence of arthropathy or
other focal bone abnormality. Soft tissues are unremarkable.
IMPRESSION: No significant abnormality seen in the left knee.

## 2014-07-12 DIAGNOSIS — I10 Essential (primary) hypertension: Secondary | ICD-10-CM | POA: Diagnosis not present

## 2014-07-12 DIAGNOSIS — I4891 Unspecified atrial fibrillation: Secondary | ICD-10-CM | POA: Diagnosis not present

## 2014-07-12 DIAGNOSIS — Z7901 Long term (current) use of anticoagulants: Secondary | ICD-10-CM | POA: Diagnosis not present

## 2014-08-13 DIAGNOSIS — E784 Other hyperlipidemia: Secondary | ICD-10-CM | POA: Diagnosis not present

## 2014-08-13 DIAGNOSIS — I4891 Unspecified atrial fibrillation: Secondary | ICD-10-CM | POA: Diagnosis not present

## 2014-08-13 DIAGNOSIS — Z7901 Long term (current) use of anticoagulants: Secondary | ICD-10-CM | POA: Diagnosis not present

## 2014-08-13 DIAGNOSIS — I10 Essential (primary) hypertension: Secondary | ICD-10-CM | POA: Diagnosis not present

## 2014-09-12 DIAGNOSIS — I4891 Unspecified atrial fibrillation: Secondary | ICD-10-CM | POA: Diagnosis not present

## 2014-09-12 DIAGNOSIS — Z7901 Long term (current) use of anticoagulants: Secondary | ICD-10-CM | POA: Diagnosis not present

## 2014-09-12 DIAGNOSIS — I10 Essential (primary) hypertension: Secondary | ICD-10-CM | POA: Diagnosis not present

## 2014-09-13 DIAGNOSIS — I10 Essential (primary) hypertension: Secondary | ICD-10-CM | POA: Diagnosis not present

## 2014-09-13 DIAGNOSIS — D638 Anemia in other chronic diseases classified elsewhere: Secondary | ICD-10-CM | POA: Diagnosis not present

## 2014-09-13 DIAGNOSIS — E669 Obesity, unspecified: Secondary | ICD-10-CM | POA: Diagnosis not present

## 2014-09-13 DIAGNOSIS — E784 Other hyperlipidemia: Secondary | ICD-10-CM | POA: Diagnosis not present

## 2014-09-13 DIAGNOSIS — E8881 Metabolic syndrome: Secondary | ICD-10-CM | POA: Diagnosis not present

## 2014-09-19 DIAGNOSIS — I10 Essential (primary) hypertension: Secondary | ICD-10-CM | POA: Diagnosis not present

## 2014-09-19 DIAGNOSIS — I4891 Unspecified atrial fibrillation: Secondary | ICD-10-CM | POA: Diagnosis not present

## 2014-09-19 DIAGNOSIS — E8881 Metabolic syndrome: Secondary | ICD-10-CM | POA: Diagnosis not present

## 2014-10-19 DIAGNOSIS — Z1389 Encounter for screening for other disorder: Secondary | ICD-10-CM | POA: Diagnosis not present

## 2014-10-19 DIAGNOSIS — I4891 Unspecified atrial fibrillation: Secondary | ICD-10-CM | POA: Diagnosis not present

## 2014-10-19 DIAGNOSIS — Z7901 Long term (current) use of anticoagulants: Secondary | ICD-10-CM | POA: Diagnosis not present

## 2014-10-19 DIAGNOSIS — I10 Essential (primary) hypertension: Secondary | ICD-10-CM | POA: Diagnosis not present

## 2014-10-19 DIAGNOSIS — E8881 Metabolic syndrome: Secondary | ICD-10-CM | POA: Diagnosis not present

## 2015-01-07 DIAGNOSIS — E784 Other hyperlipidemia: Secondary | ICD-10-CM | POA: Diagnosis not present

## 2015-01-07 DIAGNOSIS — E8881 Metabolic syndrome: Secondary | ICD-10-CM | POA: Diagnosis not present

## 2015-01-07 DIAGNOSIS — I4891 Unspecified atrial fibrillation: Secondary | ICD-10-CM | POA: Diagnosis not present

## 2015-01-07 DIAGNOSIS — I10 Essential (primary) hypertension: Secondary | ICD-10-CM | POA: Diagnosis not present

## 2015-01-22 DIAGNOSIS — M7071 Other bursitis of hip, right hip: Secondary | ICD-10-CM | POA: Diagnosis not present

## 2015-01-22 DIAGNOSIS — M5137 Other intervertebral disc degeneration, lumbosacral region: Secondary | ICD-10-CM | POA: Diagnosis not present

## 2015-01-22 DIAGNOSIS — M9904 Segmental and somatic dysfunction of sacral region: Secondary | ICD-10-CM | POA: Diagnosis not present

## 2015-01-22 NOTE — Discharge Summary (Signed)
PATIENT NAME:  Kimberly Hood, Kimberly Hood MR#:  [id number] DATE OF BIRTH:  [birth year]  DATE OF ADMISSION:  08/23/2012 DATE OF DISCHARGE:  08/24/2012  PRIMARY CARE PHYSICIAN: Cletis Athens, MD   DISCHARGE DIAGNOSES:  1. Brief episodes of dizziness.  2. Bradycardia and hypotension.  3. Hyperlipidemia.  4. History of cerebrovascular accident and coronary artery disease.  5. Chronic kidney disease, stage 3.   MEDICATIONS ON DISCHARGE:  1. Zetia 10 mg daily.  2. Furosemide 20 mg daily.  3. Lisinopril 40 mg daily.  4. Potassium chloride 20 mEq daily.  5. Simvastatin 40 mg daily.  6. Warfarin 4 mg daily.  7. Ferrex 150, one capsule daily.  8. Centrum Silver one tablet daily.  9. Osteo-Health one capsule twice a day.  10. Omega-Q Plus one capsule twice a day.   NOTE: Stop taking metoprolol 100 mg daily.   DIET: Regular consistency.   ACTIVITY: Activity as tolerated.   FOLLOWUP: Follow up with Dr. Lavera Guise in five to six days.   REASON FOR ADMISSION: The patient was admitted 08/23/2012 and discharged 08/24/2012. The patient came in with some brief episodes of dizziness. The patient was admitted to the observation area. The patient had sinus bradycardia and was placed on off-unit telemetry. Heart rate was down in the 40s. Beta blocker was held.   LABORATORY, DIAGNOSTIC AND RADIOLOGICAL DATA: EKG showed sinus bradycardia, premature atrial complex, 48 beats per minute. Troponin negative. INR 1.8. Glucose 123, BUN 22, creatinine 1.24, sodium 138, potassium 4.9, chloride 106, CO2 30, calcium 9.8. Liver function tests normal. White blood cell count 8.3, hemoglobin and hematocrit 13.7 and 40.2, platelet count of 221,000. Urinalysis trace leukocyte esterase, otherwise negative. Chest x-ray showed mild hyperinflation, prominent atherosclerotic disease. Hemoglobin A1c is 6.0. Repeat hemoglobin is 12.1. Repeat creatinine is 1.2. Echocardiogram showed calcified mitral apparatus. Mitral valve leaflets appear thickened  but opened well. Hypertrophied left ventricle with good left ventricular function, ejection fraction 40 to 45%. No thrombus. Borderline right atrial enlargement. Telemetry showed sinus bradycardia, no arrhythmias.   HOSPITAL COURSE PER PROBLEM LIST:  1. Brief episodes of dizziness: These only lasted seconds at a time. I do not put much emphasis on this. I do not believe that the bradycardia had caused this, but her metoprolol was stopped. Close clinical follow-up with Dr. Lavera Guise as outpatient is needed.  2. Bradycardia and relative hypotension: The patient was given IV fluids overnight. Metoprolol was stopped. Her other medications were held initially. The patient was not orthostatic. Blood pressure upon discharge was 149/73. The patient can restart her lisinopril and Lasix as outpatient. Heart rate 63. Can consider a lower dose of metoprolol as outpatient if indicated by Dr. Lavera Guise.  3. Hyperlipidemia: Continue simvastatin and Zetia.  4. History of cerebrovascular accident and coronary artery disease: Beta blocker now contraindicated with bradycardia. Recheck heart rate as outpatient. May be able to start a lower dose. The patient is therapeutic on Coumadin. INR is slightly low at 1.8. Follow up as outpatient.  5. Chronic kidney disease, stage III: Did not improve with IV fluid hydration. I believe this is chronic in nature. Can continue lisinopril.   TIME SPENT ON DISCHARGE: 35 minutes.  ____________________________ Tana Conch. Leslye Peer, MD rjw:cbb D: 08/24/2012 15:43:58 ET T: 08/25/2012 11:38:08 ET JOB#: [id number] cc: Tana Conch. Leslye Peer, MD, <Dictator> Cletis Athens, MD Marisue Brooklyn MD ELECTRONICALLY SIGNED 09/05/2012 17:28

## 2015-01-22 NOTE — H&P (Signed)
PATIENT NAME:  Kimberly Hood, Kimberly Hood MR#:  [id number] DATE OF BIRTH:  [birth year]  DATE OF ADMISSION:  08/23/2012  REFERRING PHYSICIAN: Dr. Mariea Clonts    PRIMARY CARE PHYSICIAN: Dr. Lavera Guise  CHIEF COMPLAINT: Dizziness.   HISTORY OF PRESENT ILLNESS: The patient is a pleasant, 81 year old Caucasian female with history of multi-infarct cerebrovascular accident in 2009 secondary to embolic phenomenon and NSTEMI, hypertension, who presents with three episodes of dizziness between 9 and 9:30. They were all brief, lasting several seconds, and the patient had to hold herself steady in order not to fall. The patient had no fall, loss of consciousness, chest pain, or palpitations. The patient does endorse positional dizziness that has been ongoing for 2 to 3 years. The patient denies having any new medications that were recently started. The patient has no pains in the chest. While in the ER per ER physician she was noted to be sinus brady down to 40s. Currently she is in the 60s and sinus. Hospitalist services were contacted for further evaluation and management. The patient is of note on metoprolol.   PAST MEDICAL HISTORY:  1. History of embolic multi-infarct cerebrovascular accident in 2009. 2. NSTEMI. 3. Mitral regurgitation. 4. Hypertension. 5. Hyperlipidemia.  6. Possible small PFO.   PAST SURGICAL HISTORY: Appendectomy and cataract surgery.   ALLERGIES: No known drug allergies.   FAMILY HISTORY: CVA and heart disease in both parents and also a brother.   OUTPATIENT MEDICATIONS:  1. Centrum Silver 1 tab daily.  2. Ferrex 150  1 cap once a day.  3. Furosemide 20 mg daily.  4. Lisinopril 40 mg daily.   5. Metoprolol tartrate 100 mg once a day.  6. Omega Q plus 1 tab 2 times a day. 7. Osteovite  1 tab 2 times a day.  8. Potassium chloride 20 mEq daily.  9. Simvastatin 40 mg daily.  10. Warfarin 4 mg daily.  11. Zetia 10 mg once a day.   REVIEW OF SYSTEMS: CONSTITUTIONAL: No fever or fatigue or  weakness. No weight changes. EYES: Has history of cataract removal and seeing white flashes for several years. ENT: No tinnitus, hearing loss, snoring, or postnasal drip. RESPIRATORY: No cough, wheezing, hemoptysis, or shortness of breath. CARDIOVASCULAR: No chest pain. No orthopnea. No history of arrhythmia in the past.  History of non-ST-elevation myocardial infarction in the past.  GENITOURINARY: Denies dysuria or hematuria. Some increased frequency last night. ENDOCRINE: No polyuria or nocturia or thyroid problems. HEME/LYMPH: No anemia or easy bruising. SKIN: No new rashes. MUSCULOSKELETAL: Denies arthritis or joint pains.  PSYCH: No anxiety or insomnia. NEURO: History of CVA in the past.   PHYSICAL EXAMINATION:  VITAL SIGNS: Temperature on arrival 97.5, pulse rate 51, respiratory rate 20, blood pressure 148/67, oxygen saturation 99% on room air. Orthostatic vitals were done and the patient is not orthostatic.   GENERAL: The patient is awake, alert, and oriented times three, lying in bed in no obvious distress.   HEENT: Normocephalic, atraumatic. Pupils are equal and reactive. Anicteric sclerae. Extraocular muscles intact.   NECK: Supple. No cervical lymphadenopathy or thyroid tenderness. Moist mucous membranes.   CARDIOVASCULAR: S1, S2. Bradycardic. Positive for murmur. No rubs or gallops.   LUNGS: Clear to auscultation mostly except some wheezing in the right upper quadrant.  Good air entry. No crackles or rhonchi.   ABDOMEN: Soft, nontender, nondistended. Positive bowel sounds in all quadrants.   EXTREMITIES: No significant lower extremity edema.   NEUROLOGICAL: Cranial nerves II through XII grossly intact.  Strength is five out of five in all extremities. Sensation intact to light touch.   SKIN: No obvious rashes.   PSYCH: Awake, alert, oriented times three. Pleasant, cooperative.   LABORATORY DATA:  Glucose 123, BUN 22, creatinine 1.24, which is slightly higher than 1.1, which was  in February 2009.  Sodium was 138, potassium 4.9. GFR 40. LFTs within normal limits. CK-MB and troponin within normal limits times one. CK total 102. WBC 8.3, hemoglobin 13.7, platelets 221. INR 1.8. Urinalysis: No nitrites, trace leukocyte esterase, 8 WBC, no bacteria.   EKG: Showing sinus bradycardia, rate 48 with premature atrial contractions. No acute ST elevations or depressions.   ASSESSMENT AND PLAN: We have a pleasant 81 year old Caucasian female who lives by herself with history of cerebrovascular accident, hypertension, and hyperlipidemia who presents with short episodes of dizziness and was found to have sinus bradycardia. At this point, we will admit the patient for observation and place the patient on off-unit telemetry, obtain a cardiology consult. Her cardiologist is Dr. Lavera Guise who should be able to see the patient while the patient is here. The patient has negative troponin. Heart rate goes from the 40s, as stated by the ER physician, and is in the 60s now. The patient has no chest pains of note. We will also obtain an echocardiogram and monitor her vitals. We will hold the beta blocker and for the blood pressure we will start amlodipine 10 mg as well as add hydralazine IV. The patient has GFR that puts her in stage III chronic kidney disease, and I do not know if this is acute or chronic, but I suspect more of chronicity. The patient is not orthostatic and not hypotensive. Will hold the lisinopril and furosemide and start the patient on gentle IV fluids at this point. She does not appear to be volume-overloaded at this point. We will continue statin and Zetia as well as Coumadin for the history of stroke.  Although INR is 1.8 I will not start a heparin drip at this point as the patient is asymptomatic. I will also hold the potassium supplements.   CODE STATUS: The patient is a DO NOT RESUSCITATE.    TOTAL TIME SPENT: 50 minutes.   ____________________________ Vivien Presto,  MD sa:bjt D: 08/23/2012 17:19:32 ET T: 08/24/2012 07:41:48 ET JOB#: [id number]  cc: Vivien Presto, MD, <Dictator> Cletis Athens, MD Karel Jarvis [facility] MD ELECTRONICALLY SIGNED 09/15/2012 11:04

## 2015-01-27 NOTE — Op Note (Signed)
PATIENT NAME:  Kimberly Hood, Kimberly Hood MR#:  [id number] DATE OF BIRTH:  October 05, 1926  DATE OF PROCEDURE:  12/28/2011  PREOPERATIVE DIAGNOSIS:  Cataract, right eye.   POSTOPERATIVE DIAGNOSIS:  Cataract, right eye.  PROCEDURE PERFORMED:  Extracapsular cataract extraction using phacoemulsification with placement of an Alcon SN60CWS, 20.5-diopter posterior chamber lens, serial #[id number].   SURGEON:  Loura Back. Sariah Henkin, MD  ASSISTANT:  None.  ANESTHESIA:  4% lidocaine and 0.75% Marcaine in a 50/50 mixture with 10 units/mL of Hylenex added, given as a peribulbar.  ANESTHESIOLOGIST:  Gunnar Bulla, MD   COMPLICATIONS:  None.  ESTIMATED BLOOD LOSS:  Less than 1 ml.  DESCRIPTION OF PROCEDURE:  The patient was brought to the operating room and given a peribulbar block.  The patient was then prepped and draped in the usual fashion.  The vertical rectus muscles were imbricated using 5-0 silk sutures.  These sutures were then clamped to the sterile drapes as bridle sutures.  A limbal peritomy was performed extending two clock hours and hemostasis was obtained with cautery.  A partial thickness scleral groove was made at the surgical limbus and dissected anteriorly in a lamellar dissection using an Alcon crescent knife.  The anterior chamber was entered superonasally with a Superblade and through the lamellar dissection with a 2.6 mm keratome.  DisCoVisc was used to replace the aqueous and a continuous tear capsulorrhexis was carried out.  Hydrodissection and hydrodelineation were carried out with balanced salt and a 27 gauge canula.  The nucleus was rotated to confirm the effectiveness of the hydrodissection.  Phacoemulsification was carried out using a divide-and-conquer technique.  Total ultrasound time was one minute and 56.2 seconds with an average power of 24.1 percent.  CDE of 42.99.  Irrigation/aspiration was used to remove the residual cortex.  DisCoVisc was used to inflate the capsule and the  internal incision was enlarged to 3 mm with the crescent knife.  The intraocular lens was folded and inserted into the capsular bag using the AcrySert Delivery System.  Irrigation/aspiration was used to remove the residual DisCoVisc.  Miostat was injected into the anterior chamber through the paracentesis track to inflate the anterior chamber and induce miosis.  The wound was checked for leaks and none were found. The conjunctiva was closed with cautery and the bridle sutures were removed.  Two drops of 0.3% Vigamox were placed on the eye.   An eye shield was placed on the eye.  The patient was discharged to the recovery room in good condition.  ____________________________ Loura Back Dade Rodin, MD sad:cbb D: 12/28/2011 13:03:20 ET T: 12/28/2011 13:21:32 ET JOB#: [id number]  cc: Remo Lipps A. Divine Imber, MD, <Dictator> Martie Lee MD ELECTRONICALLY SIGNED 01/04/2012 13:16

## 2015-01-27 NOTE — Op Note (Signed)
PATIENT NAME:  Kimberly Hood, Kimberly Hood MR#:  [id number] DATE OF BIRTH:  [birth year]  DATE OF PROCEDURE:  03/14/2012  PREOPERATIVE DIAGNOSIS: Cataract, left eye.   POSTOPERATIVE DIAGNOSIS: Cataract, left eye.   PROCEDURE PERFORMED: Extracapsular cataract extraction using phacoemulsification with placement of an Alcon SN6CWS, 2-diopter posterior chamber lens, serial #[id number].   SURGEON: Loura Back. Florabel Faulks, M.D.   ANESTHESIA: 4% lidocaine and 0.75% Marcaine in a 50-50 mixture with 10 units/mL of Hylenex added, given as a peribulbar.   ANESTHESIOLOGIST: Dr. Benjamine Mola   COMPLICATIONS: None.   ESTIMATED BLOOD LOSS: Less than 1 mL.   DESCRIPTION OF PROCEDURE:  The patient was brought to the operating room and given a peribulbar block.  The patient was then prepped and draped in the usual fashion.  The vertical rectus muscles were imbricated using 5-0 silk sutures.  These sutures were then clamped to the sterile drapes as bridle sutures.  A limbal peritomy was performed extending two clock hours and hemostasis was obtained with cautery.  A partial thickness scleral groove was made at the surgical limbus and dissected anteriorly in a lamellar dissection using an Alcon crescent knife.  The anterior chamber was entered supero-temporally with a Superblade and through the lamellar dissection with a 2.6 mm keratome.  DisCoVisc was used to replace the aqueous and a continuous tear capsulorrhexis was carried out.  Hydrodissection and hydrodelineation were carried out with balanced salt and a 27 gauge canula.  The nucleus was rotated to confirm the effectiveness of the hydrodissection.  Phacoemulsification was carried out using a divide-and-conquer technique.  Total ultrasound time was 2 minutes and 26 seconds with an average power of 21.6 percent.  CDE 50.15.   Irrigation/aspiration was used to remove the residual cortex.  DisCoVisc was used to inflate the capsule and the internal incision was enlarged to 3 mm with  the crescent knife.  The intraocular lens was folded and inserted into the capsular bag using the Acrysert delivery system.  Irrigation/aspiration was used to remove the residual DisCoVisc.  Miostat was injected into the anterior chamber through the paracentesis track to inflate the anterior chamber and induce miosis.  The wound was checked for leaks and none were found. The conjunctiva was closed with cautery and the bridle sutures were removed.  Two drops of 0.3% Vigamox were placed on the eye.   An eye shield was placed on the eye.  The patient was discharged to the recovery room in good condition.   ____________________________ Loura Back Neely Kammerer, MD sad:bjt D: 03/14/2012 13:17:09 ET T: 03/14/2012 13:50:49 ET JOB#: [id number]  cc: Remo Lipps A. Rocco Kerkhoff, MD, <Dictator> Martie Lee MD ELECTRONICALLY SIGNED 03/21/2012 13:00

## 2015-02-04 DIAGNOSIS — I10 Essential (primary) hypertension: Secondary | ICD-10-CM | POA: Diagnosis not present

## 2015-02-04 DIAGNOSIS — I209 Angina pectoris, unspecified: Secondary | ICD-10-CM | POA: Diagnosis not present

## 2015-02-04 DIAGNOSIS — E8881 Metabolic syndrome: Secondary | ICD-10-CM | POA: Diagnosis not present

## 2015-02-04 DIAGNOSIS — I4891 Unspecified atrial fibrillation: Secondary | ICD-10-CM | POA: Diagnosis not present

## 2015-02-20 DIAGNOSIS — L821 Other seborrheic keratosis: Secondary | ICD-10-CM | POA: Diagnosis not present

## 2015-02-20 DIAGNOSIS — Z85828 Personal history of other malignant neoplasm of skin: Secondary | ICD-10-CM | POA: Diagnosis not present

## 2015-02-20 DIAGNOSIS — L570 Actinic keratosis: Secondary | ICD-10-CM | POA: Diagnosis not present

## 2015-03-12 DIAGNOSIS — I10 Essential (primary) hypertension: Secondary | ICD-10-CM | POA: Diagnosis not present

## 2015-03-12 DIAGNOSIS — I4891 Unspecified atrial fibrillation: Secondary | ICD-10-CM | POA: Diagnosis not present

## 2015-04-16 DIAGNOSIS — L298 Other pruritus: Secondary | ICD-10-CM | POA: Diagnosis not present

## 2015-06-13 DIAGNOSIS — I4891 Unspecified atrial fibrillation: Secondary | ICD-10-CM | POA: Diagnosis not present

## 2015-06-13 DIAGNOSIS — E784 Other hyperlipidemia: Secondary | ICD-10-CM | POA: Diagnosis not present

## 2015-06-13 DIAGNOSIS — I10 Essential (primary) hypertension: Secondary | ICD-10-CM | POA: Diagnosis not present

## 2015-06-14 DIAGNOSIS — Z23 Encounter for immunization: Secondary | ICD-10-CM | POA: Diagnosis not present

## 2015-07-11 DIAGNOSIS — E784 Other hyperlipidemia: Secondary | ICD-10-CM | POA: Diagnosis not present

## 2015-07-11 DIAGNOSIS — I10 Essential (primary) hypertension: Secondary | ICD-10-CM | POA: Diagnosis not present

## 2015-07-11 DIAGNOSIS — I4891 Unspecified atrial fibrillation: Secondary | ICD-10-CM | POA: Diagnosis not present

## 2015-08-14 DIAGNOSIS — I4891 Unspecified atrial fibrillation: Secondary | ICD-10-CM | POA: Diagnosis not present

## 2015-09-13 DIAGNOSIS — D638 Anemia in other chronic diseases classified elsewhere: Secondary | ICD-10-CM | POA: Diagnosis not present

## 2015-09-13 DIAGNOSIS — I4891 Unspecified atrial fibrillation: Secondary | ICD-10-CM | POA: Diagnosis not present

## 2015-09-13 DIAGNOSIS — E784 Other hyperlipidemia: Secondary | ICD-10-CM | POA: Diagnosis not present

## 2015-09-13 DIAGNOSIS — I10 Essential (primary) hypertension: Secondary | ICD-10-CM | POA: Diagnosis not present

## 2015-10-17 DIAGNOSIS — E8881 Metabolic syndrome: Secondary | ICD-10-CM | POA: Diagnosis not present

## 2015-10-17 DIAGNOSIS — I10 Essential (primary) hypertension: Secondary | ICD-10-CM | POA: Diagnosis not present

## 2015-10-17 DIAGNOSIS — D638 Anemia in other chronic diseases classified elsewhere: Secondary | ICD-10-CM | POA: Diagnosis not present

## 2015-10-17 DIAGNOSIS — I4891 Unspecified atrial fibrillation: Secondary | ICD-10-CM | POA: Diagnosis not present

## 2015-10-17 DIAGNOSIS — E669 Obesity, unspecified: Secondary | ICD-10-CM | POA: Diagnosis not present

## 2015-10-25 DIAGNOSIS — R0789 Other chest pain: Secondary | ICD-10-CM | POA: Diagnosis not present

## 2015-10-25 DIAGNOSIS — I4891 Unspecified atrial fibrillation: Secondary | ICD-10-CM | POA: Diagnosis not present

## 2015-10-29 DIAGNOSIS — I517 Cardiomegaly: Secondary | ICD-10-CM | POA: Diagnosis not present

## 2015-10-29 DIAGNOSIS — I059 Rheumatic mitral valve disease, unspecified: Secondary | ICD-10-CM | POA: Diagnosis not present

## 2015-10-29 DIAGNOSIS — R079 Chest pain, unspecified: Secondary | ICD-10-CM | POA: Diagnosis not present

## 2015-10-29 DIAGNOSIS — I69398 Other sequelae of cerebral infarction: Secondary | ICD-10-CM | POA: Diagnosis not present

## 2015-10-29 DIAGNOSIS — I4891 Unspecified atrial fibrillation: Secondary | ICD-10-CM | POA: Diagnosis not present

## 2015-10-31 DIAGNOSIS — I059 Rheumatic mitral valve disease, unspecified: Secondary | ICD-10-CM | POA: Diagnosis not present

## 2015-10-31 DIAGNOSIS — I517 Cardiomegaly: Secondary | ICD-10-CM | POA: Diagnosis not present

## 2015-10-31 DIAGNOSIS — R079 Chest pain, unspecified: Secondary | ICD-10-CM | POA: Diagnosis not present

## 2015-11-12 DIAGNOSIS — E784 Other hyperlipidemia: Secondary | ICD-10-CM | POA: Diagnosis not present

## 2015-11-12 DIAGNOSIS — I10 Essential (primary) hypertension: Secondary | ICD-10-CM | POA: Diagnosis not present

## 2015-11-12 DIAGNOSIS — I4891 Unspecified atrial fibrillation: Secondary | ICD-10-CM | POA: Diagnosis not present

## 2015-11-12 DIAGNOSIS — I059 Rheumatic mitral valve disease, unspecified: Secondary | ICD-10-CM | POA: Diagnosis not present

## 2015-12-10 DIAGNOSIS — I10 Essential (primary) hypertension: Secondary | ICD-10-CM | POA: Diagnosis not present

## 2015-12-10 DIAGNOSIS — I4891 Unspecified atrial fibrillation: Secondary | ICD-10-CM | POA: Diagnosis not present

## 2015-12-10 DIAGNOSIS — E784 Other hyperlipidemia: Secondary | ICD-10-CM | POA: Diagnosis not present

## 2015-12-10 DIAGNOSIS — I059 Rheumatic mitral valve disease, unspecified: Secondary | ICD-10-CM | POA: Diagnosis not present

## 2016-01-10 DIAGNOSIS — I4891 Unspecified atrial fibrillation: Secondary | ICD-10-CM | POA: Diagnosis not present

## 2016-01-10 DIAGNOSIS — I059 Rheumatic mitral valve disease, unspecified: Secondary | ICD-10-CM | POA: Diagnosis not present

## 2016-01-10 DIAGNOSIS — H539 Unspecified visual disturbance: Secondary | ICD-10-CM | POA: Diagnosis not present

## 2016-01-10 DIAGNOSIS — I69398 Other sequelae of cerebral infarction: Secondary | ICD-10-CM | POA: Diagnosis not present

## 2016-01-14 DIAGNOSIS — I10 Essential (primary) hypertension: Secondary | ICD-10-CM | POA: Diagnosis not present

## 2016-01-14 DIAGNOSIS — I69398 Other sequelae of cerebral infarction: Secondary | ICD-10-CM | POA: Diagnosis not present

## 2016-01-14 DIAGNOSIS — H539 Unspecified visual disturbance: Secondary | ICD-10-CM | POA: Diagnosis not present

## 2016-02-04 DIAGNOSIS — I69398 Other sequelae of cerebral infarction: Secondary | ICD-10-CM | POA: Diagnosis not present

## 2016-02-04 DIAGNOSIS — I4891 Unspecified atrial fibrillation: Secondary | ICD-10-CM | POA: Diagnosis not present

## 2016-02-04 DIAGNOSIS — I059 Rheumatic mitral valve disease, unspecified: Secondary | ICD-10-CM | POA: Diagnosis not present

## 2016-02-04 DIAGNOSIS — H539 Unspecified visual disturbance: Secondary | ICD-10-CM | POA: Diagnosis not present

## 2016-02-20 DIAGNOSIS — L821 Other seborrheic keratosis: Secondary | ICD-10-CM | POA: Diagnosis not present

## 2016-02-20 DIAGNOSIS — Z85828 Personal history of other malignant neoplasm of skin: Secondary | ICD-10-CM | POA: Diagnosis not present

## 2016-02-20 DIAGNOSIS — L570 Actinic keratosis: Secondary | ICD-10-CM | POA: Diagnosis not present

## 2016-02-20 DIAGNOSIS — X32XXXA Exposure to sunlight, initial encounter: Secondary | ICD-10-CM | POA: Diagnosis not present

## 2016-02-20 DIAGNOSIS — D0439 Carcinoma in situ of skin of other parts of face: Secondary | ICD-10-CM | POA: Diagnosis not present

## 2016-02-20 DIAGNOSIS — Z08 Encounter for follow-up examination after completed treatment for malignant neoplasm: Secondary | ICD-10-CM | POA: Diagnosis not present

## 2016-02-20 DIAGNOSIS — D485 Neoplasm of uncertain behavior of skin: Secondary | ICD-10-CM | POA: Diagnosis not present

## 2016-03-12 DIAGNOSIS — I059 Rheumatic mitral valve disease, unspecified: Secondary | ICD-10-CM | POA: Diagnosis not present

## 2016-03-12 DIAGNOSIS — I69398 Other sequelae of cerebral infarction: Secondary | ICD-10-CM | POA: Diagnosis not present

## 2016-03-12 DIAGNOSIS — E8881 Metabolic syndrome: Secondary | ICD-10-CM | POA: Diagnosis not present

## 2016-03-12 DIAGNOSIS — I4891 Unspecified atrial fibrillation: Secondary | ICD-10-CM | POA: Diagnosis not present

## 2016-04-13 DIAGNOSIS — I10 Essential (primary) hypertension: Secondary | ICD-10-CM | POA: Diagnosis not present

## 2016-04-13 DIAGNOSIS — I69398 Other sequelae of cerebral infarction: Secondary | ICD-10-CM | POA: Diagnosis not present

## 2016-04-13 DIAGNOSIS — I4891 Unspecified atrial fibrillation: Secondary | ICD-10-CM | POA: Diagnosis not present

## 2016-04-13 DIAGNOSIS — H539 Unspecified visual disturbance: Secondary | ICD-10-CM | POA: Diagnosis not present

## 2016-04-29 DIAGNOSIS — T63461A Toxic effect of venom of wasps, accidental (unintentional), initial encounter: Secondary | ICD-10-CM | POA: Diagnosis not present

## 2016-04-29 DIAGNOSIS — D0439 Carcinoma in situ of skin of other parts of face: Secondary | ICD-10-CM | POA: Diagnosis not present

## 2016-05-11 DIAGNOSIS — I69398 Other sequelae of cerebral infarction: Secondary | ICD-10-CM | POA: Diagnosis not present

## 2016-05-11 DIAGNOSIS — E8881 Metabolic syndrome: Secondary | ICD-10-CM | POA: Diagnosis not present

## 2016-05-11 DIAGNOSIS — I4891 Unspecified atrial fibrillation: Secondary | ICD-10-CM | POA: Diagnosis not present

## 2016-05-11 DIAGNOSIS — I059 Rheumatic mitral valve disease, unspecified: Secondary | ICD-10-CM | POA: Diagnosis not present

## 2016-06-01 ENCOUNTER — Other Ambulatory Visit: Payer: Self-pay

## 2016-06-01 DIAGNOSIS — I10 Essential (primary) hypertension: Secondary | ICD-10-CM | POA: Diagnosis not present

## 2016-06-01 DIAGNOSIS — R5381 Other malaise: Secondary | ICD-10-CM | POA: Diagnosis not present

## 2016-06-01 DIAGNOSIS — E784 Other hyperlipidemia: Secondary | ICD-10-CM | POA: Diagnosis not present

## 2016-06-09 DIAGNOSIS — E8881 Metabolic syndrome: Secondary | ICD-10-CM | POA: Diagnosis not present

## 2016-06-09 DIAGNOSIS — Z23 Encounter for immunization: Secondary | ICD-10-CM | POA: Diagnosis not present

## 2016-06-09 DIAGNOSIS — I059 Rheumatic mitral valve disease, unspecified: Secondary | ICD-10-CM | POA: Diagnosis not present

## 2016-06-09 DIAGNOSIS — I69398 Other sequelae of cerebral infarction: Secondary | ICD-10-CM | POA: Diagnosis not present

## 2016-06-09 DIAGNOSIS — I10 Essential (primary) hypertension: Secondary | ICD-10-CM | POA: Diagnosis not present

## 2016-07-09 DIAGNOSIS — I69398 Other sequelae of cerebral infarction: Secondary | ICD-10-CM | POA: Diagnosis not present

## 2016-07-09 DIAGNOSIS — E784 Other hyperlipidemia: Secondary | ICD-10-CM | POA: Diagnosis not present

## 2016-07-09 DIAGNOSIS — H539 Unspecified visual disturbance: Secondary | ICD-10-CM | POA: Diagnosis not present

## 2016-07-09 DIAGNOSIS — I4891 Unspecified atrial fibrillation: Secondary | ICD-10-CM | POA: Diagnosis not present

## 2016-08-07 DIAGNOSIS — R5381 Other malaise: Secondary | ICD-10-CM | POA: Diagnosis not present

## 2016-08-07 DIAGNOSIS — R42 Dizziness and giddiness: Secondary | ICD-10-CM | POA: Diagnosis not present

## 2016-08-07 DIAGNOSIS — I4891 Unspecified atrial fibrillation: Secondary | ICD-10-CM | POA: Diagnosis not present

## 2016-08-07 DIAGNOSIS — I10 Essential (primary) hypertension: Secondary | ICD-10-CM | POA: Diagnosis not present

## 2016-08-07 DIAGNOSIS — E784 Other hyperlipidemia: Secondary | ICD-10-CM | POA: Diagnosis not present

## 2016-08-14 DIAGNOSIS — I059 Rheumatic mitral valve disease, unspecified: Secondary | ICD-10-CM | POA: Diagnosis not present

## 2016-08-14 DIAGNOSIS — I4891 Unspecified atrial fibrillation: Secondary | ICD-10-CM | POA: Diagnosis not present

## 2016-08-14 DIAGNOSIS — R55 Syncope and collapse: Secondary | ICD-10-CM | POA: Diagnosis not present

## 2016-08-14 DIAGNOSIS — E784 Other hyperlipidemia: Secondary | ICD-10-CM | POA: Diagnosis not present

## 2016-08-26 DIAGNOSIS — I10 Essential (primary) hypertension: Secondary | ICD-10-CM | POA: Diagnosis not present

## 2016-08-26 DIAGNOSIS — H539 Unspecified visual disturbance: Secondary | ICD-10-CM | POA: Diagnosis not present

## 2016-08-26 DIAGNOSIS — I209 Angina pectoris, unspecified: Secondary | ICD-10-CM | POA: Diagnosis not present

## 2016-08-26 DIAGNOSIS — I69398 Other sequelae of cerebral infarction: Secondary | ICD-10-CM | POA: Diagnosis not present

## 2016-09-25 DIAGNOSIS — I209 Angina pectoris, unspecified: Secondary | ICD-10-CM | POA: Diagnosis not present

## 2016-09-25 DIAGNOSIS — D638 Anemia in other chronic diseases classified elsewhere: Secondary | ICD-10-CM | POA: Diagnosis not present

## 2016-09-25 DIAGNOSIS — H539 Unspecified visual disturbance: Secondary | ICD-10-CM | POA: Diagnosis not present

## 2016-09-25 DIAGNOSIS — I69398 Other sequelae of cerebral infarction: Secondary | ICD-10-CM | POA: Diagnosis not present

## 2016-10-17 ENCOUNTER — Emergency Department: Payer: Medicare Other

## 2016-10-17 ENCOUNTER — Encounter: Payer: Self-pay | Admitting: Emergency Medicine

## 2016-10-17 ENCOUNTER — Emergency Department
Admission: EM | Admit: 2016-10-17 | Discharge: 2016-10-17 | Disposition: A | Discharge status: Home / Self Care | Payer: Medicare Other | Attending: Emergency Medicine | Admitting: Emergency Medicine

## 2016-10-17 DIAGNOSIS — I252 Old myocardial infarction: Secondary | ICD-10-CM | POA: Diagnosis not present

## 2016-10-17 DIAGNOSIS — I10 Essential (primary) hypertension: Secondary | ICD-10-CM | POA: Insufficient documentation

## 2016-10-17 DIAGNOSIS — Z7901 Long term (current) use of anticoagulants: Secondary | ICD-10-CM | POA: Diagnosis not present

## 2016-10-17 DIAGNOSIS — Z79899 Other long term (current) drug therapy: Secondary | ICD-10-CM | POA: Insufficient documentation

## 2016-10-17 DIAGNOSIS — I4891 Unspecified atrial fibrillation: Secondary | ICD-10-CM

## 2016-10-17 DIAGNOSIS — I480 Paroxysmal atrial fibrillation: Secondary | ICD-10-CM | POA: Diagnosis not present

## 2016-10-17 DIAGNOSIS — R0789 Other chest pain: Secondary | ICD-10-CM | POA: Diagnosis present

## 2016-10-17 DIAGNOSIS — R079 Chest pain, unspecified: Secondary | ICD-10-CM | POA: Diagnosis not present

## 2016-10-17 HISTORY — DX: Acute myocardial infarction, unspecified: I21.9

## 2016-10-17 HISTORY — DX: Cerebral infarction, unspecified: I63.9

## 2016-10-17 HISTORY — DX: Essential (primary) hypertension: I10

## 2016-10-17 LAB — BASIC METABOLIC PANEL
Anion gap: 7 (ref 5–15)
BUN: 25 mg/dL — AB (ref 6–20)
CALCIUM: 8.9 mg/dL (ref 8.9–10.3)
CHLORIDE: 106 mmol/L (ref 101–111)
CO2: 25 mmol/L (ref 22–32)
Creatinine, Ser: 1.33 mg/dL — ABNORMAL HIGH (ref 0.44–1.00)
GFR calc Af Amer: 40 mL/min — ABNORMAL LOW (ref >60)
GFR calc non Af Amer: 35 mL/min — ABNORMAL LOW (ref >60)
Glucose, Bld: 112 mg/dL — ABNORMAL HIGH (ref 65–99)
Potassium: 4.2 mmol/L (ref 3.5–5.1)
SODIUM: 138 mmol/L (ref 135–145)

## 2016-10-17 LAB — PROTIME-INR
INR: 2.7
PROTHROMBIN TIME: 29.2 s — AB (ref 11.4–15.2)

## 2016-10-17 LAB — TROPONIN I

## 2016-10-17 LAB — CBC
HCT: 38.5 % (ref 35.0–47.0)
Hemoglobin: 13.4 g/dL (ref 12.0–16.0)
MCH: 32.4 pg (ref 26.0–34.0)
MCHC: 34.9 g/dL (ref 32.0–36.0)
MCV: 92.7 fL (ref 80.0–100.0)
Platelets: 203 10*3/uL (ref 150–440)
RBC: 4.15 MIL/uL (ref 3.80–5.20)
RDW: 14.3 % (ref 11.5–14.5)
WBC: 8.5 10*3/uL (ref 3.6–11.0)

## 2016-10-17 NOTE — ED Triage Notes (Signed)
Patient to ER from home via [facility] for c/o new onset A-Fib with rate of 180. Patient called EMS for c/o chest discomfort, upon arrival patient was in A-Fib. Patient was given 10mg  Cardizem and ASA 324mg . Rate decreased to 120-140 range. Patient did not receive Nitro d/t systolic BP dropping to 98 after Cradizem. Patient states now chest discomfort is gone completely, but c/o tightness across shoulders. Patient had no ST changes throughout monitoring per EMS. Patient states chest pain began after waking. Unsure of what she was doing at the moment, but states she was already awake and had breakfast.

## 2016-10-17 NOTE — ED Provider Notes (Signed)
[facility] Emergency Department Provider Note ____________________________________________   I have reviewed the triage vital signs and the triage nursing note.  HISTORY  Chief Complaint Tachycardia   Historian Patient  HPI Kimberly Hood is a 83 y.o. female with a history of prior stroke, vertebrobasilar insufficiency per her physician, as well as prior MI and hypertension, presents today after eating breakfast with a friend and feeling symptoms of what she called indigestion and described as central chest pressure. She took some medication and that didn't seem to help. She had some lightheaded and dizziness and because it was not going away she called EMS. They found atrial fibrillation with rapid ventricular response and gave her a dose of diltiazem prehospital, patient converted.  Currently asymptomatic. No recent illness such as cough or congestion or fevers. No recent volume depletion such as vomiting or diarrhea.  She takes Coumadin for history of stroke. She is not sure she's never had A. fib before. Her primary care doctor/cardiologist is Dr. Rebecka Apley    Past Medical History:  Diagnosis Date  . Hypertension   . MI (myocardial infarction) 2009  . Stroke [facility])     There are no active problems to display for this patient.   Past Surgical History:  Procedure Laterality Date  . ABDOMINAL HYSTERECTOMY    . APPENDECTOMY    . TONSILLECTOMY      Prior to Admission medications   Medication Sig Start Date End Date Taking? Authorizing Provider  Coenzyme Q10-Fish Oil-Vit E (CO-Q 10 OMEGA-3 FISH OIL PO) Take 2 capsules by mouth daily.   Yes Historical Provider, MD  ezetimibe (ZETIA) 10 MG tablet Take 10 mg by mouth daily.   Yes Historical Provider, MD  furosemide (LASIX) 20 MG tablet Take 20 mg by mouth daily.   Yes Historical Provider, MD  iron polysaccharides (FERREX 150) 150 MG capsule Take 150 mg by mouth daily.   Yes Historical Provider, MD   lisinopril (PRINIVIL,ZESTRIL) 40 MG tablet Take 40 mg by mouth daily.   Yes Historical Provider, MD  potassium chloride SA (K-DUR,KLOR-CON) 20 MEQ tablet Take 20 mEq by mouth 2 (two) times daily.   Yes Historical Provider, MD  simvastatin (ZOCOR) 40 MG tablet Take 40 mg by mouth daily.   Yes Historical Provider, MD  warfarin (COUMADIN) 4 MG tablet Take 4 mg by mouth daily.   Yes Historical Provider, MD    No Known Allergies  No family history on file.  Social History Social History  Substance Use Topics  . Smoking status: Never Smoker  . Smokeless tobacco: Never Used  . Alcohol use No    Review of Systems  Constitutional: Negative for fever. Eyes: Negative for visual changes. ENT: Negative for sore throat. Cardiovascular: Positive for chest pain. Respiratory: Positive for shortness of breath. Gastrointestinal: Negative for abdominal pain, vomiting and diarrhea. Genitourinary: Negative for dysuria. Musculoskeletal: Negative for back pain. Skin: Negative for rash. Neurological: Negative for headache. 10 point Review of Systems otherwise negative ____________________________________________   PHYSICAL EXAM:  VITAL SIGNS: ED Triage Vitals  Enc Vitals Group     BP 10/17/16 1148 140/61     Pulse Rate 10/17/16 1148 64     Resp 10/17/16 1148 13     Temp 10/17/16 1148 97.8 F (36.6 C)     Temp Source 10/17/16 1148 Oral     SpO2 10/17/16 1148 100 %     Weight 10/17/16 1149 123 lb (55.8 kg)     Height 10/17/16 1149  5' (1.524 m)     Head Circumference --      Peak Flow --      Pain Score 10/17/16 1149 4     Pain Loc --      Pain Edu? --      Excl. in [place]? --      Constitutional: Alert and oriented. Well appearing and in no distress. HEENT   Head: Normocephalic and atraumatic.      Eyes: Conjunctivae are normal. PERRL. Normal extraocular movements.      Ears:         Nose: No congestion/rhinnorhea.   Mouth/Throat: Mucous membranes are moist.   Neck: No  stridor. Cardiovascular/Chest: Bradycardic, regular rhythm.  No murmurs, rubs, or gallops. Respiratory: Normal respiratory effort without tachypnea nor retractions. Breath sounds are clear and equal bilaterally. No wheezes/rales/rhonchi. Gastrointestinal: Soft. No distention, no guarding, no rebound. Nontender.    Genitourinary/rectal:Deferred Musculoskeletal: Nontender with normal range of motion in all extremities. No joint effusions.  No lower extremity tenderness.  No edema. Neurologic:  Normal speech and language. No gross or focal neurologic deficits are appreciated. Skin:  Skin is warm, dry and intact. No rash noted. Psychiatric: Mood and affect are normal. Speech and behavior are normal. Patient exhibits appropriate insight and judgment.   ____________________________________________  LABS (pertinent positives/negatives)  Labs Reviewed  BASIC METABOLIC PANEL - Abnormal; Notable for the following:       Result Value   Glucose, Bld 112 (*)    BUN 25 (*)    Creatinine, Ser 1.33 (*)    GFR calc non Af Amer 35 (*)    GFR calc Af Amer 40 (*)    All other components within normal limits  PROTIME-INR - Abnormal; Notable for the following:    Prothrombin Time 29.2 (*)    All other components within normal limits  CBC  TROPONIN I    ____________________________________________    EKG I, Lisa Roca, MD, the attending physician have personally viewed and interpreted all ECGs.  60 bpm. Normal sinus rhythm. Right bundle branch block. Normal axis. Nonspecific ST-T wave without ST segment elevation. ____________________________________________  RADIOLOGY All Xrays were viewed by me. Imaging interpreted by Radiologist.  Chest x-ray two-view: No acute cardio pulmonary disease. __________________________________________  PROCEDURES  Procedure(s) performed: None  Critical Care performed: None  ____________________________________________   ED COURSE / ASSESSMENT AND  PLAN  Pertinent labs & imaging results that were available during my care of the patient were reviewed by me and considered in my medical decision making (see chart for details).  Unclear whether or not this is new onset A. fib, although the patient states that she is on Coumadin and so in my mind the possibility.  In any case, I did review the strip and it doesn't look like A. fib with rapid ventricular response, now in normal sinus rhythm in the 60s.  No clear inciting cause. Laboratory evaluation is reassuring.  I spoke with her cardiologist Dr. Rebecka Apley, who recommends discharge home and outpatient follow-up closely on Monday.    CONSULTATIONS:  Dr. Dellia Beckwith, by phone, recommends no additional rate control given her bradycardia. He would like to see her on Monday, patient will call.  Does not recommend hospitalization, and believe she's had A. fib in the past.   Patient / Family / Caregiver informed of clinical course, medical decision-making process, and agree with plan.   I discussed return precautions, follow-up instructions, and discharge instructions with patient and/or family.   ___________________________________________  FINAL CLINICAL IMPRESSION(S) / ED DIAGNOSES   Final diagnoses:  Atrial fibrillation with rapid ventricular response [facility])              Note: This dictation was prepared with Dragon dictation. Any transcriptional errors that result from this process are unintentional    Lisa Roca, MD 10/17/16 1536

## 2016-10-17 NOTE — ED Notes (Signed)
Patient transported to X-ray 

## 2016-10-17 NOTE — Discharge Instructions (Signed)
You were evaluated for heart racing and found to have an episode of irregular heartbeat, atrial fibrillation that was causing her heart also race. It was improved after the EMS gave him a dose of medication.  Your exam and evaluation are reassuring in the emergency department any heart is back and rhythm.  I spoke with your heart doctor, Dr. Rebecka Apley, who recommended no change in medications right now. He would like to see you on Monday, please call the office for an appointment.  Return to the emergency department immediately for any return of symptoms such as trouble breathing, shortness of breath, chest pain, heart racing, dizziness, altered mental status, fever, or any other symptoms concerning to you

## 2016-10-19 DIAGNOSIS — I69398 Other sequelae of cerebral infarction: Secondary | ICD-10-CM | POA: Diagnosis not present

## 2016-10-19 DIAGNOSIS — I209 Angina pectoris, unspecified: Secondary | ICD-10-CM | POA: Diagnosis not present

## 2016-10-19 DIAGNOSIS — I10 Essential (primary) hypertension: Secondary | ICD-10-CM | POA: Diagnosis not present

## 2016-10-19 DIAGNOSIS — I059 Rheumatic mitral valve disease, unspecified: Secondary | ICD-10-CM | POA: Diagnosis not present

## 2016-10-29 DIAGNOSIS — I059 Rheumatic mitral valve disease, unspecified: Secondary | ICD-10-CM | POA: Diagnosis not present

## 2016-10-29 DIAGNOSIS — I69398 Other sequelae of cerebral infarction: Secondary | ICD-10-CM | POA: Diagnosis not present

## 2016-10-29 DIAGNOSIS — D638 Anemia in other chronic diseases classified elsewhere: Secondary | ICD-10-CM | POA: Diagnosis not present

## 2016-10-29 DIAGNOSIS — I10 Essential (primary) hypertension: Secondary | ICD-10-CM | POA: Diagnosis not present

## 2016-10-29 DIAGNOSIS — I209 Angina pectoris, unspecified: Secondary | ICD-10-CM | POA: Diagnosis not present

## 2016-11-23 DIAGNOSIS — I10 Essential (primary) hypertension: Secondary | ICD-10-CM | POA: Diagnosis not present

## 2016-11-23 DIAGNOSIS — I059 Rheumatic mitral valve disease, unspecified: Secondary | ICD-10-CM | POA: Diagnosis not present

## 2016-11-23 DIAGNOSIS — I517 Cardiomegaly: Secondary | ICD-10-CM | POA: Diagnosis not present

## 2016-11-23 DIAGNOSIS — I4891 Unspecified atrial fibrillation: Secondary | ICD-10-CM | POA: Diagnosis not present

## 2016-12-21 DIAGNOSIS — I209 Angina pectoris, unspecified: Secondary | ICD-10-CM | POA: Diagnosis not present

## 2016-12-21 DIAGNOSIS — J209 Acute bronchitis, unspecified: Secondary | ICD-10-CM | POA: Diagnosis not present

## 2016-12-21 DIAGNOSIS — E784 Other hyperlipidemia: Secondary | ICD-10-CM | POA: Diagnosis not present

## 2016-12-21 DIAGNOSIS — I4891 Unspecified atrial fibrillation: Secondary | ICD-10-CM | POA: Diagnosis not present

## 2016-12-23 ENCOUNTER — Emergency Department
Admission: EM | Admit: 2016-12-23 | Discharge: 2016-12-23 | Disposition: A | Discharge status: Home / Self Care | Payer: Medicare Other | Attending: Emergency Medicine | Admitting: Emergency Medicine

## 2016-12-23 ENCOUNTER — Encounter: Payer: Self-pay | Admitting: Medical Oncology

## 2016-12-23 ENCOUNTER — Emergency Department: Payer: Medicare Other

## 2016-12-23 DIAGNOSIS — Y9389 Activity, other specified: Secondary | ICD-10-CM | POA: Insufficient documentation

## 2016-12-23 DIAGNOSIS — S0101XA Laceration without foreign body of scalp, initial encounter: Secondary | ICD-10-CM | POA: Insufficient documentation

## 2016-12-23 DIAGNOSIS — S0990XA Unspecified injury of head, initial encounter: Secondary | ICD-10-CM | POA: Diagnosis not present

## 2016-12-23 DIAGNOSIS — Y999 Unspecified external cause status: Secondary | ICD-10-CM | POA: Diagnosis not present

## 2016-12-23 DIAGNOSIS — Z79899 Other long term (current) drug therapy: Secondary | ICD-10-CM | POA: Insufficient documentation

## 2016-12-23 DIAGNOSIS — W1809XA Striking against other object with subsequent fall, initial encounter: Secondary | ICD-10-CM | POA: Insufficient documentation

## 2016-12-23 DIAGNOSIS — R001 Bradycardia, unspecified: Secondary | ICD-10-CM | POA: Insufficient documentation

## 2016-12-23 DIAGNOSIS — W19XXXA Unspecified fall, initial encounter: Secondary | ICD-10-CM | POA: Diagnosis not present

## 2016-12-23 DIAGNOSIS — Y929 Unspecified place or not applicable: Secondary | ICD-10-CM | POA: Insufficient documentation

## 2016-12-23 DIAGNOSIS — Z7901 Long term (current) use of anticoagulants: Secondary | ICD-10-CM | POA: Insufficient documentation

## 2016-12-23 DIAGNOSIS — I10 Essential (primary) hypertension: Secondary | ICD-10-CM | POA: Diagnosis not present

## 2016-12-23 LAB — BASIC METABOLIC PANEL
Anion gap: 6 (ref 5–15)
BUN: 25 mg/dL — AB (ref 6–20)
CHLORIDE: 104 mmol/L (ref 101–111)
CO2: 26 mmol/L (ref 22–32)
Calcium: 9.2 mg/dL (ref 8.9–10.3)
Creatinine, Ser: 1.25 mg/dL — ABNORMAL HIGH (ref 0.44–1.00)
GFR calc Af Amer: 43 mL/min — ABNORMAL LOW (ref >60)
GFR calc non Af Amer: 37 mL/min — ABNORMAL LOW (ref >60)
Glucose, Bld: 99 mg/dL (ref 65–99)
Potassium: 4.7 mmol/L (ref 3.5–5.1)
Sodium: 136 mmol/L (ref 135–145)

## 2016-12-23 LAB — CBC
HEMATOCRIT: 41.2 % (ref 35.0–47.0)
HEMOGLOBIN: 13.9 g/dL (ref 12.0–16.0)
MCH: 31.3 pg (ref 26.0–34.0)
MCHC: 33.7 g/dL (ref 32.0–36.0)
MCV: 92.8 fL (ref 80.0–100.0)
Platelets: 206 10*3/uL (ref 150–440)
RBC: 4.44 MIL/uL (ref 3.80–5.20)
RDW: 14.6 % — ABNORMAL HIGH (ref 11.5–14.5)
WBC: 7.9 10*3/uL (ref 3.6–11.0)

## 2016-12-23 LAB — PROTIME-INR
INR: 3.07
Prothrombin Time: 32.4 seconds — ABNORMAL HIGH (ref 11.4–15.2)

## 2016-12-23 MED ORDER — LIDOCAINE-EPINEPHRINE-TETRACAINE (LET) SOLUTION
NASAL | Status: AC
  Administered 2016-12-23: 3 mL via TOPICAL
  Filled 2016-12-23: qty 3

## 2016-12-23 MED ORDER — LIDOCAINE-EPINEPHRINE-TETRACAINE (LET) SOLUTION
3.0000 mL | Freq: Once | NASAL | Status: AC
Start: 2016-12-23 — End: 2016-12-23
  Administered 2016-12-23: 3 mL via TOPICAL

## 2016-12-23 NOTE — Discharge Instructions (Signed)
You may shower with staples in your head, but do not soak the staples. No water aerobics until the staples have been removed. Make an appointment with your primary care physician to have your staples removed in 5-7 days. Have your doctor recheck your heart rate when you go for your appointment.  Return to the emergency department if you develop severe pain, vomiting, changes in vision, speech, or mental status, numbness tingling or weakness, or any other symptoms concerning to you.

## 2016-12-23 NOTE — ED Notes (Signed)
Pt assisted to restroom by this tech with little to no assistance needed.

## 2016-12-23 NOTE — ED Notes (Signed)
Pt transported to CT prior to triage. Pt alert and oriented, NAD.

## 2016-12-23 NOTE — ED Triage Notes (Signed)
Pt to ed via [facility] with reports of having a fall today. Pt is from [place]. Pt fell back and hit her head per ems. NO LOC at time of fall, pt is on coumadin.  VSS. Bleeding controlled.

## 2016-12-23 NOTE — ED Provider Notes (Addendum)
[facility] Emergency Department Provider Note  ____________________________________________  Time seen: Approximately 5:14 PM  I have reviewed the triage vital signs and the nursing notes.   HISTORY  Chief Complaint Fall    HPI Kimberly Hood is a 83 y.o. female on Coumadin for atrial fibrillation, CAD status post MI and CVA presenting with head trauma after a fall. 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 She has not had any chest pain, shortness of breath, lightheadedness or palpitations. She has not had a severe headache, visual changes, numbness tingling or weakness, or vomiting. She does have a laceration to the posterior scalp.   Past Medical History:  Diagnosis Date  . Hypertension   . MI (myocardial infarction) 2009  . Stroke [facility])     There are no active problems to display for this patient.   Past Surgical History:  Procedure Laterality Date  . ABDOMINAL HYSTERECTOMY    . APPENDECTOMY    . TONSILLECTOMY      Current Outpatient Rx  . Order #: [id number] Class: Historical Med  . Order #: [id number] Class: Historical Med  . Order #: [id number] Class: Historical Med  . Order #: [id number] Class: Historical Med  . Order #: [id number] Class: Historical Med  . Order #: [id number] Class: Historical Med  . Order #: [id number] Class: Historical Med  . Order #: [id number] Class: Historical Med    Allergies Patient has no known allergies.  No family history on file.  Social History Social History  Substance Use Topics  . Smoking status: Never Smoker  . Smokeless tobacco: Never Used  . Alcohol use No    Review of Systems Constitutional: No fever/chills.Positive mechanical fall. Negative lightheadedness or syncope. No loss of consciousness.  Eyes: No visual changes. No blurred or double  vision. ENT: No sore throat. No congestion or rhinorrhea. Head: Positive head trauma with laceration. Cardiovascular: Denies chest pain. Denies palpitations. Respiratory: Denies shortness of breath.  No cough. Gastrointestinal: No abdominal pain.  No nausea, no vomiting.  No diarrhea.  No constipation. Genitourinary: Negative for dysuria. Musculoskeletal: Negative for back pain. Denies neck pain. Denies any pain in the extremities or pelvis. Skin: Negative for rash. Neurological: Negative for headaches. No focal numbness, tingling or weakness. The changes in speech, vision or mental status. 10-point ROS otherwise negative.  ____________________________________________   PHYSICAL EXAM:  VITAL SIGNS: ED Triage Vitals [12/23/16 1554]  Enc Vitals Group     BP (!) 192/53     Pulse Rate (!) 53     Resp 18     Temp 97.8 F (36.6 C)     Temp Source Oral     SpO2 99 %     Weight 122 lb (55.3 kg)     Height 5\' 2"  (1.575 m)     Head Circumference      Peak Flow      Pain Score 2     Pain Loc      Pain Edu?      Excl. in [place]?     Constitutional: Alert and oriented. Well appearing and in no acute distress. Answers questions appropriately. Eyes: Conjunctivae are normal.  EOMI. No scleral icterus.The left pupil is 3 mm in the right pupil is 2 mm; there is no history of asymmetric pupils but the patient has a history of cardiac cataract surgery. Both pupils  are reactive. Head: 1.5 cm laceration to the left posterior scalp with surrounding edema; the laceration is hemostatic at this time. No raccoon eyes or Battle sign. Nose: No congestion/rhinnorhea. Swelling over the nose or septal hematoma. Mouth/Throat: Mucous membranes are moist. No dental injury or malocclusion.  Neck: No stridor.  Supple.  Midline C-spine tenderness to palpation, step-offs or deformities. No JVD. No meningismus. Cardiovascular: Normal rate, regular rhythm. No murmurs, rubs or gallops.  Respiratory: Normal respiratory  effort.  No accessory muscle use or retractions. Lungs CTAB.  No wheezes, rales or ronchi. Gastrointestinal: Soft, nontender and nondistended.  No guarding or rebound.  No peritoneal signs. Musculoskeletal: Pelvis is stable. The patient has full range of motion of the bilateral shoulders, elbows, wrists, hips, knees, and ankles without pain. Thoracic or lumbar spine tenderness to palpation, step-offs or deformities.. Neurologic:  A&Ox3.  Speech is clear.  Face and smile are symmetric.  EOMI. he pulls are asymmetric per above.  Moves all extremities well. Skin:  Skin is warm, dry. No rash noted. Psychiatric: Mood and affect are normal. Speech and behavior are normal.  Normal judgement.  ____________________________________________   LABS (all labs ordered are listed, but only abnormal results are displayed)  Labs Reviewed  CBC - Abnormal; Notable for the following:       Result Value   RDW 14.6 (*)    All other components within normal limits  BASIC METABOLIC PANEL - Abnormal; Notable for the following:    BUN 25 (*)    Creatinine, Ser 1.25 (*)    GFR calc non Af Amer 37 (*)    GFR calc Af Amer 43 (*)    All other components within normal limits  PROTIME-INR - Abnormal; Notable for the following:    Prothrombin Time 32.4 (*)    All other components within normal limits   ____________________________________________  EKG  ED ECG REPORT I, Eula Listen, the attending physician, personally viewed and interpreted this ECG.   Date: 12/23/2016  EKG Time: 1811  Rate: 55  Rhythm: sinus bradycardia  Axis: normal  Intervals:none; RBBB  ST&T Change: No STEMI  Unchanged when compared to 1/18. ____________________________________________  RADIOLOGY  Ct Head Wo Contrast  Result Date: 12/23/2016 CLINICAL DATA:  Fall, head injury.  On Coumadin. EXAM: CT HEAD WITHOUT CONTRAST TECHNIQUE: Contiguous axial images were obtained from the base of the skull through the vertex without  intravenous contrast. COMPARISON:  CT head 09/15/2013 FINDINGS: Brain: Mild atrophy. Negative for hydrocephalus. Chronic microvascular ischemic changes in the white matter and left basal ganglia similar to the prior study Negative for acute infarct. No intracranial hemorrhage or mass. No midline shift. Punctate calcification left frontal parietal lobe over the convexity is unchanged from the prior study. Vascular: Atherosclerotic disease.  No hyperdense vessel. Skull: Negative for fracture.  Right parietal scalp laceration. Sinuses/Orbits: Negative Other: None IMPRESSION: No acute intracranial abnormality.  No change from the prior study. Electronically Signed   By: Franchot Gallo M.D.   On: 12/23/2016 15:33    ____________________________________________   PROCEDURES  Procedure(s) performed: None  Procedures  Critical Care performed: No ____________________________________________   INITIAL IMPRESSION / ASSESSMENT AND PLAN / ED COURSE  Pertinent labs & imaging results that were available during my care of the patient were reviewed by me and considered in my medical decision making (see chart for details).  83 y.o. female on Coumadin with a mechanical fall and head trauma. The patient's CT scan does not show any acute intracranial  process. The patient's neurologic examination is reassuring with the exception of asymmetric pupils. Is possible that she has had these at baseline, and she has no visual deficits at this time. With a negative CAT scan, it is more likely that this is chronic, but I will have her follow-up with her primary care physician for this. I'll get basic labs with an INR, and a screening EKG, and initiate laceration repair. I anticipate discharge home if the patient's remainder evaluation is reassuring.  LACERATION REPAIR Performed by: Eula Listen Authorized by: Eula Listen Consent: Verbal consent obtained. Risks and benefits: risks, benefits and  alternatives were discussed Consent given by: patient Patient identity confirmed: provided demographic data Prepped and Draped in normal sterile fashion Wound explored  Laceration Location: left posterior scalp  Laceration Length: 1.5cm  No Foreign Bodies seen or palpated  Anesthesia: local infiltration  Local anesthetic: topical LET  Skin closure: 2 staples   Technique: linear  Patient tolerance: Patient tolerated the procedure well with no immediate complications.  ----------------------------------------- 6:40 PM on 12/23/2016 -----------------------------------------  The patient does have sinus bradycardia on her EKG, without any ischemic changes. She very clearly ascribes a mechanical fall, and has not had any lightheadedness, chest pain, exertional shortness of breath or syncope. I think it is unlikely that this is related to her fall today, I have asked her to follow up with her primary care physician regarding her sinus bradycardia. At this time, the patient is stable for discharge. She and her granddaughter understand return precautions as well as follow-up instructions.  ____________________________________________  FINAL CLINICAL IMPRESSION(S) / ED DIAGNOSES  Final diagnoses:  Fall, initial encounter  Laceration of scalp, initial encounter  Sinus bradycardia         NEW MEDICATIONS STARTED DURING THIS VISIT:  New Prescriptions   No medications on file      Eula Listen, MD 12/23/16 1827    Eula Listen, MD 12/23/16 6599    Eula Listen, MD 12/23/16 3570

## 2016-12-30 DIAGNOSIS — E669 Obesity, unspecified: Secondary | ICD-10-CM | POA: Diagnosis not present

## 2016-12-30 DIAGNOSIS — J209 Acute bronchitis, unspecified: Secondary | ICD-10-CM | POA: Diagnosis not present

## 2016-12-30 DIAGNOSIS — S0990XA Unspecified injury of head, initial encounter: Secondary | ICD-10-CM | POA: Diagnosis not present

## 2016-12-30 DIAGNOSIS — I4891 Unspecified atrial fibrillation: Secondary | ICD-10-CM | POA: Diagnosis not present

## 2017-01-21 DIAGNOSIS — I69398 Other sequelae of cerebral infarction: Secondary | ICD-10-CM | POA: Diagnosis not present

## 2017-01-21 DIAGNOSIS — I209 Angina pectoris, unspecified: Secondary | ICD-10-CM | POA: Diagnosis not present

## 2017-01-21 DIAGNOSIS — I4891 Unspecified atrial fibrillation: Secondary | ICD-10-CM | POA: Diagnosis not present

## 2017-01-21 DIAGNOSIS — H539 Unspecified visual disturbance: Secondary | ICD-10-CM | POA: Diagnosis not present

## 2017-01-25 DIAGNOSIS — J209 Acute bronchitis, unspecified: Secondary | ICD-10-CM | POA: Diagnosis not present

## 2017-01-25 DIAGNOSIS — E669 Obesity, unspecified: Secondary | ICD-10-CM | POA: Diagnosis not present

## 2017-01-25 DIAGNOSIS — E784 Other hyperlipidemia: Secondary | ICD-10-CM | POA: Diagnosis not present

## 2017-01-25 DIAGNOSIS — I4891 Unspecified atrial fibrillation: Secondary | ICD-10-CM | POA: Diagnosis not present

## 2017-01-28 DIAGNOSIS — R42 Dizziness and giddiness: Secondary | ICD-10-CM | POA: Diagnosis not present

## 2017-01-28 DIAGNOSIS — R5381 Other malaise: Secondary | ICD-10-CM | POA: Diagnosis not present

## 2017-01-28 DIAGNOSIS — I4891 Unspecified atrial fibrillation: Secondary | ICD-10-CM | POA: Diagnosis not present

## 2017-01-28 DIAGNOSIS — E8881 Metabolic syndrome: Secondary | ICD-10-CM | POA: Diagnosis not present

## 2017-01-28 DIAGNOSIS — E784 Other hyperlipidemia: Secondary | ICD-10-CM | POA: Diagnosis not present

## 2017-01-28 DIAGNOSIS — I10 Essential (primary) hypertension: Secondary | ICD-10-CM | POA: Diagnosis not present

## 2017-02-05 DIAGNOSIS — R55 Syncope and collapse: Secondary | ICD-10-CM | POA: Diagnosis not present

## 2017-02-05 DIAGNOSIS — I10 Essential (primary) hypertension: Secondary | ICD-10-CM | POA: Diagnosis not present

## 2017-02-05 DIAGNOSIS — I4891 Unspecified atrial fibrillation: Secondary | ICD-10-CM | POA: Diagnosis not present

## 2017-02-05 DIAGNOSIS — I69398 Other sequelae of cerebral infarction: Secondary | ICD-10-CM | POA: Diagnosis not present

## 2017-02-08 ENCOUNTER — Telehealth: Payer: Self-pay

## 2017-02-08 NOTE — Telephone Encounter (Signed)
Lmov for patient to call back and schedule referral from  [facility] for Abid/slow vent response Will try again at a later time

## 2017-02-09 ENCOUNTER — Encounter: Payer: Self-pay | Admitting: Internal Medicine

## 2017-02-09 ENCOUNTER — Ambulatory Visit (INDEPENDENT_AMBULATORY_CARE_PROVIDER_SITE_OTHER): Payer: Medicare Other | Admitting: Internal Medicine

## 2017-02-09 VITALS — BP 146/48 | HR 49 | Ht 60.0 in | Wt 121.8 lb

## 2017-02-09 DIAGNOSIS — I480 Paroxysmal atrial fibrillation: Secondary | ICD-10-CM

## 2017-02-09 DIAGNOSIS — R011 Cardiac murmur, unspecified: Secondary | ICD-10-CM | POA: Diagnosis not present

## 2017-02-09 DIAGNOSIS — R55 Syncope and collapse: Secondary | ICD-10-CM | POA: Diagnosis not present

## 2017-02-09 NOTE — Progress Notes (Signed)
ELECTROPHYSIOLOGY CONSULT NOTE  Patient ID: Kimberly Hood, MRN: [id number], DOB/AGE: November 11, 1925 83 y.o. Admit date: (Not on file) Date of Consult: 02/09/2017  Primary Physician: Cletis Athens, MD Primary Cardiologist: new   Floreen Teegarden is being seen today for the evaluation of syncope at the request of  Cletis Athens, MD.   HPI Kimberly Hood is a 83 y.o. female  Referred for consideration of treatment for some bradycardia. She had a fall 3/18. This was abruptly  and of brief duration. She had stool up from the card table and fell without warning. She has had some episodes of lightheadedness that are brief;  she's also had episodes of dizziness that lasted 30 minutes. They are not positional.  She denies chest pain or shortness of breath. She has reasonable exercise tolerance. She does not have edema or nocturnal dyspnea.  She carries a diagnosis of paroxysmal atrial fibrillation and is anticoagulated with Coumadin; ECGs confirming this or not available  she has a history of a prior stroke dated 2009 and notes from 7616 describes "embolic phenomenon and STEMI "    Echocardiogram 2013 EF 40-45% with left ventricular hypertrophy  She has bifascicular block  Past Medical History:  Diagnosis Date  . Heart attack ([place])   . Hypertension   . MI (myocardial infarction) ([place]) 2009  . Stroke [facility])       Surgical History:  Past Surgical History:  Procedure Laterality Date  . ABDOMINAL HYSTERECTOMY    . APPENDECTOMY    . TONSILLECTOMY       Home Meds: Prior to Admission medications   Medication Sig Start Date End Date Taking? Authorizing Provider  Coenzyme Q10-Fish Oil-Vit E (CO-Q 10 OMEGA-3 FISH OIL PO) Take 2 capsules by mouth daily.    [provider]  ezetimibe (ZETIA) 10 MG tablet Take 10 mg by mouth daily.    [provider]  furosemide (LASIX) 20 MG tablet Take 20 mg by mouth daily.    [provider]  iron  polysaccharides (FERREX 150) 150 MG capsule Take 150 mg by mouth daily.    [provider]  lisinopril (PRINIVIL,ZESTRIL) 40 MG tablet Take 40 mg by mouth daily.    [provider]  potassium chloride SA (K-DUR,KLOR-CON) 20 MEQ tablet Take 20 mEq by mouth 2 (two) times daily.    [provider]  simvastatin (ZOCOR) 40 MG tablet Take 40 mg by mouth daily.    [provider]  warfarin (COUMADIN) 4 MG tablet Take 4 mg by mouth daily.    [provider]    Allergies: No Known Allergies  Social History   Social History  . Marital status: Widowed    Spouse name: N/A  . Number of children: N/A  . Years of education: N/A   Occupational History  . Not on file.   Social History Main Topics  . Smoking status: Never Smoker  . Smokeless tobacco: Never Used  . Alcohol use No  . Drug use: No  . Sexual activity: Not on file   Other Topics Concern  . Not on file   Social History Narrative  . No narrative on file     Family History  Problem Relation Age of Onset  . Diabetes Mother   . Heart attack Mother   . Heart attack Father   . Brain cancer Sister   . Hypertension Sister      ROS:  Please see the history of present  illness.     All other systems reviewed and negative.    Physical Exam: Blood pressure (!) 146/48, pulse (!) 49, height 5' (1.524 m), weight 121 lb 12 oz (55.2 kg). General: Well developed, well nourished female in no acute distress. Head: Normocephalic, atraumatic, sclera non-icteric, no xanthomas, nares are without discharge. EENT: normal  Lymph Nodes:  none Neck: Bilateral bruits versus transmitted murmur parvus and tardus  JVD not elevated. Back:with kyphosis  Lungs: Clear bilaterally to auscultation without wheezes, rales, or rhonchi. Breathing is unlabored. Heart: RRR with S1 S2. 4/6 systolic  murmur . No rubs, or gallops appreciated. Abdomen: Soft, non-tender, non-distended with normoactive bowel sounds. No  hepatomegaly. No rebound/guarding. No obvious abdominal masses. Msk:  Strength and tone appear normal for age. Extremities: No clubbing or cyanosis. No * edema.  Distal pedal pulses are 2+ and equal bilaterally. Skin: Warm and Dry Neuro: Alert and oriented X 3. CN III-XII intact Grossly normal sensory and motor function . Psych:  Responds to questions appropriately with a normal affect.      Labs: Cardiac Enzymes No results for input(s): CKTOTAL, CKMB, TROPONINI in the last 72 hours. CBC Lab Results  Component Value Date   WBC 7.9 12/23/2016   HGB 13.9 12/23/2016   HCT 41.2 12/23/2016   MCV 92.8 12/23/2016   PLT 206 12/23/2016   PROTIME: No results for input(s): LABPROT, INR in the last 72 hours. Chemistry No results for input(s): NA, K, CL, CO2, BUN, CREATININE, CALCIUM, PROT, BILITOT, ALKPHOS, ALT, AST, GLUCOSE in the last 168 hours.  Invalid input(s): LABALBU Lipids No results found for: CHOL, HDL, LDLCALC, TRIG BNP No results found for: PROBNP Thyroid Function Tests: No results for input(s): TSH, T4TOTAL, T3FREE, THYROIDAB in the last 72 hours.  Invalid input(s): FREET3 Miscellaneous No results found for: DDIMER  Radiology/Studies:  No results found.  EHO:ZYYQM @ 49 19/13/48 100   Assessment and Plan:   Sinus bradycardia  Syncope   Bifascicular block right bundle branch left posterior fascicular block  Atrial fibrillation by report  Stroke  Warfarin therapy    The patient has had syncope and a fall in the context of documented sinus bradycardia into the 20s and bifascicular block. Pacing is indicated. On the event itself may have been associated with vasomotor instability, class IIa recommendations were made for pacing  This will also allow [place] to documented atrial fibrillation although the context of her stroke I'm not sure that her anticoagulations would be discontinued.  No bleeding issues.      Virl Axe

## 2017-02-09 NOTE — Patient Instructions (Addendum)
Medication Instructions: - Your physician recommends that you continue on your current medications as directed. Please refer to the Current Medication list given to you today.  Labwork: - pending procedure date  Procedures/Testing: - Your physician has requested that you have an echocardiogram. Echocardiography is a painless test that uses sound waves to create images of your heart. It provides your doctor with information about the size and shape of your heart and how well your heart's chambers and valves are working. This procedure takes approximately one hour. There are no restrictions for this procedure.  - Your physician has recommended that you have a pacemaker inserted. A pacemaker is a small device that is placed under the skin of your chest or abdomen to help control abnormal heart rhythms. This device uses electrical pulses to prompt the heart to beat at a normal rate. Pacemakers are used to treat heart rhythms that are too slow. Wire (leads) are attached to the pacemaker that goes into the chambers of you heart. This is done in the hospital and usually requires and overnight stay.  ** Dr. Olin Pia nurse, Nira Conn will call you to schedule**   Follow-Up: - pending procedure date  Any Additional Special Instructions Will Be Listed Below (If Applicable).     If you need a refill on your cardiac medications before your next appointment, please call your pharmacy.

## 2017-02-11 ENCOUNTER — Telehealth: Payer: Self-pay | Admitting: Internal Medicine

## 2017-02-11 DIAGNOSIS — R001 Bradycardia, unspecified: Secondary | ICD-10-CM

## 2017-02-11 DIAGNOSIS — Z01812 Encounter for preprocedural laboratory examination: Secondary | ICD-10-CM

## 2017-02-11 NOTE — Telephone Encounter (Signed)
Pt saw Dr. Caryl Comes May 8 w/ recommendations for a pacemaker.  Pt informed she would be contacted by Alvis Lemmings, RN. Will route to [employer].

## 2017-02-11 NOTE — Telephone Encounter (Signed)
Daughter in law checking to see when pacemaker implantation can be scheduled.  Please call to discuss.

## 2017-02-12 ENCOUNTER — Encounter: Payer: Self-pay | Admitting: *Deleted

## 2017-02-12 NOTE — Telephone Encounter (Signed)
I spoke with Sharl Ma. The patient is scheduled for Thursday 02/25/17 at 9:30 am with Dr. Lovena Le for her PPM. Verbal instructions given to the patient's daughter- we will also place a copy at the front desk for pick up on 5/16 when she comes for lab work.

## 2017-02-12 NOTE — Addendum Note (Signed)
Addended by: Alvis Lemmings C on: 02/12/2017 01:29 PM   Modules accepted: Orders

## 2017-02-15 ENCOUNTER — Telehealth: Payer: Self-pay | Admitting: Internal Medicine

## 2017-02-15 DIAGNOSIS — H539 Unspecified visual disturbance: Secondary | ICD-10-CM | POA: Diagnosis not present

## 2017-02-15 DIAGNOSIS — I69398 Other sequelae of cerebral infarction: Secondary | ICD-10-CM | POA: Diagnosis not present

## 2017-02-15 DIAGNOSIS — I4891 Unspecified atrial fibrillation: Secondary | ICD-10-CM | POA: Diagnosis not present

## 2017-02-15 DIAGNOSIS — D638 Anemia in other chronic diseases classified elsewhere: Secondary | ICD-10-CM | POA: Diagnosis not present

## 2017-02-15 NOTE — Telephone Encounter (Signed)
Spoke with pts daughter in law and informed her that its not necessary that someone stay with pt after procedure as long as pt is able to adhere to the lifting and movement restrictions of arm, as well as monitoring the device site of signs of infection such as drainage, fever or chills and a noticeable increase in swelling. Pts daughter in law stated that pt would adhere to these restrictions.

## 2017-02-15 NOTE — Telephone Encounter (Signed)
Pt daughter in law would like to know after pt has pacer inserted on 5/24, how long should someone stay with her after she comes home. Please call and advise.

## 2017-02-17 ENCOUNTER — Other Ambulatory Visit (INDEPENDENT_AMBULATORY_CARE_PROVIDER_SITE_OTHER): Payer: Medicare Other

## 2017-02-17 ENCOUNTER — Other Ambulatory Visit: Payer: Self-pay | Admitting: *Deleted

## 2017-02-17 DIAGNOSIS — R001 Bradycardia, unspecified: Secondary | ICD-10-CM

## 2017-02-17 DIAGNOSIS — Z01812 Encounter for preprocedural laboratory examination: Secondary | ICD-10-CM

## 2017-02-17 DIAGNOSIS — I480 Paroxysmal atrial fibrillation: Secondary | ICD-10-CM

## 2017-02-18 LAB — CBC WITH DIFFERENTIAL/PLATELET
BASOS: 0 %
Basophils Absolute: 0 10*3/uL (ref 0.0–0.2)
EOS (ABSOLUTE): 0.1 10*3/uL (ref 0.0–0.4)
EOS: 1 %
HEMATOCRIT: 41.8 % (ref 34.0–46.6)
HEMOGLOBIN: 13.9 g/dL (ref 11.1–15.9)
IMMATURE GRANULOCYTES: 0 %
Immature Grans (Abs): 0 10*3/uL (ref 0.0–0.1)
LYMPHS ABS: 1.4 10*3/uL (ref 0.7–3.1)
LYMPHS: 14 %
MCH: 31.6 pg (ref 26.6–33.0)
MCHC: 33.3 g/dL (ref 31.5–35.7)
MCV: 95 fL (ref 79–97)
MONOCYTES: 7 %
MONOS ABS: 0.7 10*3/uL (ref 0.1–0.9)
Neutrophils Absolute: 7.6 10*3/uL — ABNORMAL HIGH (ref 1.4–7.0)
Neutrophils: 78 %
Platelets: 237 10*3/uL (ref 150–379)
RBC: 4.4 x10E6/uL (ref 3.77–5.28)
RDW: 14.3 % (ref 12.3–15.4)
WBC: 9.9 10*3/uL (ref 3.4–10.8)

## 2017-02-18 LAB — BASIC METABOLIC PANEL
BUN/Creatinine Ratio: 21 (ref 12–28)
BUN: 25 mg/dL (ref 8–27)
CALCIUM: 9.1 mg/dL (ref 8.7–10.3)
CO2: 19 mmol/L (ref 18–29)
Chloride: 101 mmol/L (ref 96–106)
Creatinine, Ser: 1.19 mg/dL — ABNORMAL HIGH (ref 0.57–1.00)
GFR, EST AFRICAN AMERICAN: 47 mL/min/{1.73_m2} — AB (ref >59)
GFR, EST NON AFRICAN AMERICAN: 41 mL/min/{1.73_m2} — AB (ref >59)
Glucose: 80 mg/dL (ref 65–99)
Potassium: 4.6 mmol/L (ref 3.5–5.2)
Sodium: 141 mmol/L (ref 134–144)

## 2017-02-18 LAB — PROTIME-INR
INR: 2 — ABNORMAL HIGH (ref 0.8–1.2)
PROTHROMBIN TIME: 20.5 s — AB (ref 9.1–12.0)

## 2017-02-19 ENCOUNTER — Other Ambulatory Visit: Payer: Self-pay

## 2017-02-19 ENCOUNTER — Telehealth: Payer: Self-pay | Admitting: Internal Medicine

## 2017-02-19 ENCOUNTER — Ambulatory Visit (INDEPENDENT_AMBULATORY_CARE_PROVIDER_SITE_OTHER): Payer: Medicare Other

## 2017-02-19 DIAGNOSIS — I480 Paroxysmal atrial fibrillation: Secondary | ICD-10-CM | POA: Diagnosis not present

## 2017-02-19 NOTE — Telephone Encounter (Signed)
Patient is here in office today to have echocardiogram. Her son reports that on Wednesday they were told to hold coumadin on Sunday for upcoming procedure. He also states that the week prior to this they had told her to stop it at that time. So he states they were confused if she should have stopped taking it a week ago or on Sunday. Instructed him to make sure if she is still in fact taking it to have her stop on Sunday and I will make Dr. Olin Pia nurse aware that they were confused on the instructions. He did request that Nira Conn give her daughter in law a call to discuss. He provided me with her name Nerissa Constantin at [phone]. He was appreciative for the time and had no further questions at this time. I did make him aware that she was not here today but that I would send to her for when she returns.

## 2017-02-21 ENCOUNTER — Other Ambulatory Visit: Payer: Self-pay | Admitting: Internal Medicine

## 2017-02-23 NOTE — Telephone Encounter (Signed)
I had called and spoken with Sharl Ma on Thursday 02/18/17 and given her instructions to have the patient take her last dose of coumadin on Sunday 02/21/17.

## 2017-02-24 DIAGNOSIS — Z950 Presence of cardiac pacemaker: Secondary | ICD-10-CM

## 2017-02-24 HISTORY — DX: Presence of cardiac pacemaker: Z95.0

## 2017-02-25 ENCOUNTER — Encounter (HOSPITAL_COMMUNITY): Payer: Self-pay | Admitting: Internal Medicine

## 2017-02-25 ENCOUNTER — Observation Stay (HOSPITAL_COMMUNITY)
Admission: RE | Admit: 2017-02-25 | Discharge: 2017-02-26 | Disposition: A | Discharge status: Home / Self Care | Payer: Medicare Other | Source: Ambulatory Visit | Attending: Internal Medicine | Admitting: Internal Medicine

## 2017-02-25 ENCOUNTER — Encounter (HOSPITAL_COMMUNITY)
Admission: RE | Disposition: A | Discharge status: Home / Self Care | Payer: Self-pay | Source: Ambulatory Visit | Attending: Internal Medicine

## 2017-02-25 DIAGNOSIS — I441 Atrioventricular block, second degree: Principal | ICD-10-CM | POA: Diagnosis present

## 2017-02-25 DIAGNOSIS — I252 Old myocardial infarction: Secondary | ICD-10-CM | POA: Diagnosis not present

## 2017-02-25 DIAGNOSIS — Z7901 Long term (current) use of anticoagulants: Secondary | ICD-10-CM | POA: Diagnosis not present

## 2017-02-25 DIAGNOSIS — I10 Essential (primary) hypertension: Secondary | ICD-10-CM | POA: Diagnosis not present

## 2017-02-25 DIAGNOSIS — I480 Paroxysmal atrial fibrillation: Secondary | ICD-10-CM | POA: Diagnosis not present

## 2017-02-25 DIAGNOSIS — R001 Bradycardia, unspecified: Secondary | ICD-10-CM | POA: Insufficient documentation

## 2017-02-25 DIAGNOSIS — Z8673 Personal history of transient ischemic attack (TIA), and cerebral infarction without residual deficits: Secondary | ICD-10-CM | POA: Insufficient documentation

## 2017-02-25 DIAGNOSIS — R55 Syncope and collapse: Secondary | ICD-10-CM | POA: Insufficient documentation

## 2017-02-25 DIAGNOSIS — I452 Bifascicular block: Secondary | ICD-10-CM | POA: Insufficient documentation

## 2017-02-25 DIAGNOSIS — I445 Left posterior fascicular block: Secondary | ICD-10-CM | POA: Insufficient documentation

## 2017-02-25 DIAGNOSIS — Z950 Presence of cardiac pacemaker: Secondary | ICD-10-CM

## 2017-02-25 HISTORY — DX: Presence of cardiac pacemaker: Z95.0

## 2017-02-25 HISTORY — PX: PACEMAKER IMPLANT: EP1218

## 2017-02-25 LAB — PROTIME-INR
INR: 0.98
PROTHROMBIN TIME: 13 s (ref 11.4–15.2)

## 2017-02-25 LAB — SURGICAL PCR SCREEN
MRSA, PCR: NEGATIVE
Staphylococcus aureus: NEGATIVE

## 2017-02-25 SURGERY — PACEMAKER IMPLANT
Anesthesia: LOCAL

## 2017-02-25 MED ORDER — ADULT MULTIVITAMIN W/MINERALS CH
1.0000 | ORAL_TABLET | Freq: Every day | ORAL | Status: DC
  Administered 2017-02-25 – 2017-02-26 (×2): 1 via ORAL
  Filled 2017-02-25: qty 1

## 2017-02-25 MED ORDER — MIDAZOLAM HCL 5 MG/5ML IJ SOLN
INTRAMUSCULAR | Status: AC
  Filled 2017-02-25: qty 5

## 2017-02-25 MED ORDER — CEFAZOLIN SODIUM-DEXTROSE 1-4 GM/50ML-% IV SOLN
1.0000 g | Freq: Four times a day (QID) | INTRAVENOUS | Status: DC
  Administered 2017-02-26 (×2): 1 g via INTRAVENOUS
  Filled 2017-02-25 (×3): qty 50

## 2017-02-25 MED ORDER — LIDOCAINE HCL (PF) 1 % IJ SOLN
INTRAMUSCULAR | Status: AC
  Filled 2017-02-25: qty 60

## 2017-02-25 MED ORDER — CEFAZOLIN SODIUM-DEXTROSE 2-4 GM/100ML-% IV SOLN
2.0000 g | INTRAVENOUS | Status: AC
  Administered 2017-02-25: 2 g via INTRAVENOUS

## 2017-02-25 MED ORDER — POLYSACCHARIDE IRON COMPLEX 150 MG PO CAPS
150.0000 mg | ORAL_CAPSULE | Freq: Every day | ORAL | Status: DC
  Administered 2017-02-25 – 2017-02-26 (×2): 150 mg via ORAL
  Filled 2017-02-25 (×2): qty 1

## 2017-02-25 MED ORDER — FUROSEMIDE 20 MG PO TABS
20.0000 mg | ORAL_TABLET | Freq: Every day | ORAL | Status: DC
  Administered 2017-02-25 – 2017-02-26 (×2): 20 mg via ORAL
  Filled 2017-02-25 (×2): qty 1

## 2017-02-25 MED ORDER — ONE-DAILY MULTI VITAMINS PO TABS: 1.0000 | ORAL_TABLET | Freq: Every day | ORAL | Status: DC

## 2017-02-25 MED ORDER — CEFAZOLIN SODIUM-DEXTROSE 2-4 GM/100ML-% IV SOLN
INTRAVENOUS | Status: AC
  Filled 2017-02-25: qty 100

## 2017-02-25 MED ORDER — POTASSIUM CHLORIDE CRYS ER 20 MEQ PO TBCR
20.0000 meq | EXTENDED_RELEASE_TABLET | Freq: Two times a day (BID) | ORAL | Status: DC
  Administered 2017-02-25 – 2017-02-26 (×3): 20 meq via ORAL
  Filled 2017-02-25 (×3): qty 1

## 2017-02-25 MED ORDER — LISINOPRIL 40 MG PO TABS
40.0000 mg | ORAL_TABLET | Freq: Every day | ORAL | Status: DC
  Administered 2017-02-25 – 2017-02-26 (×2): 40 mg via ORAL
  Filled 2017-02-25 (×2): qty 1

## 2017-02-25 MED ORDER — ACETAMINOPHEN 325 MG PO TABS: 325.0000 mg | ORAL_TABLET | ORAL | Status: DC | PRN

## 2017-02-25 MED ORDER — WARFARIN SODIUM 2 MG PO TABS
2.0000 mg | ORAL_TABLET | Freq: Every day | ORAL | Status: DC
  Administered 2017-02-25: 2 mg via ORAL
  Filled 2017-02-25: qty 1

## 2017-02-25 MED ORDER — HEPARIN (PORCINE) IN NACL 2-0.9 UNIT/ML-% IJ SOLN
INTRAMUSCULAR | Status: AC | PRN
  Administered 2017-02-25: 500 mL

## 2017-02-25 MED ORDER — CHLORHEXIDINE GLUCONATE 4 % EX LIQD: 60.0000 mL | Freq: Once | CUTANEOUS | Status: DC

## 2017-02-25 MED ORDER — SODIUM CHLORIDE 0.9 % IR SOLN
Status: AC
  Filled 2017-02-25: qty 2

## 2017-02-25 MED ORDER — EZETIMIBE 10 MG PO TABS
10.0000 mg | ORAL_TABLET | Freq: Every day | ORAL | Status: DC
  Administered 2017-02-25 – 2017-02-26 (×2): 10 mg via ORAL
  Filled 2017-02-25 (×2): qty 1

## 2017-02-25 MED ORDER — HEPARIN (PORCINE) IN NACL 2-0.9 UNIT/ML-% IJ SOLN
INTRAMUSCULAR | Status: AC
  Filled 2017-02-25: qty 500

## 2017-02-25 MED ORDER — GENTAMICIN SULFATE 40 MG/ML IJ SOLN
80.0000 mg | INTRAMUSCULAR | Status: AC
  Administered 2017-02-25: 80 mg

## 2017-02-25 MED ORDER — WARFARIN - PHYSICIAN DOSING INPATIENT
Freq: Every day | Status: DC
  Administered 2017-02-25: 18:00:00

## 2017-02-25 MED ORDER — MUPIROCIN 2 % EX OINT
1.0000 "application " | TOPICAL_OINTMENT | Freq: Once | CUTANEOUS | Status: AC
  Administered 2017-02-25: 1 via TOPICAL
  Filled 2017-02-25: qty 22

## 2017-02-25 MED ORDER — MIDAZOLAM HCL 5 MG/5ML IJ SOLN
INTRAMUSCULAR | Status: DC | PRN
Start: 2017-02-25 — End: 2017-02-25
  Administered 2017-02-25 (×2): 1 mg via INTRAVENOUS

## 2017-02-25 MED ORDER — MUPIROCIN 2 % EX OINT
TOPICAL_OINTMENT | CUTANEOUS | Status: AC
  Administered 2017-02-25: 1 via TOPICAL
  Filled 2017-02-25: qty 22

## 2017-02-25 MED ORDER — ONDANSETRON HCL 4 MG/2ML IJ SOLN: 4.0000 mg | Freq: Four times a day (QID) | INTRAMUSCULAR | Status: DC | PRN

## 2017-02-25 MED ORDER — LIDOCAINE HCL (PF) 1 % IJ SOLN
INTRAMUSCULAR | Status: DC | PRN
Start: 2017-02-25 — End: 2017-02-25
  Administered 2017-02-25: 45 mL

## 2017-02-25 MED ORDER — FENTANYL CITRATE (PF) 100 MCG/2ML IJ SOLN
INTRAMUSCULAR | Status: DC | PRN
  Administered 2017-02-25 (×2): 12.5 ug via INTRAVENOUS

## 2017-02-25 MED ORDER — FENTANYL CITRATE (PF) 100 MCG/2ML IJ SOLN
INTRAMUSCULAR | Status: AC
  Filled 2017-02-25: qty 2

## 2017-02-25 MED ORDER — SIMVASTATIN 40 MG PO TABS
40.0000 mg | ORAL_TABLET | Freq: Every day | ORAL | Status: DC
  Administered 2017-02-25 – 2017-02-26 (×2): 40 mg via ORAL
  Filled 2017-02-25 (×2): qty 1

## 2017-02-25 MED ORDER — SODIUM CHLORIDE 0.9 % IV SOLN
INTRAVENOUS | Status: DC
  Administered 2017-02-25: 09:00:00 via INTRAVENOUS

## 2017-02-25 SURGICAL SUPPLY — 12 items
CABLE SURGICAL S-101-97-12 (CABLE) ×2 IMPLANT
CATH RIGHTSITE C315HIS02 (CATHETERS) ×2 IMPLANT
LEAD SELECT SECURE 3830 383069 (Lead) ×1 IMPLANT
LEAD TENDRIL MRI 46CM LPA1200M (Lead) ×2 IMPLANT
PACEMAKER ASSURITY DR-RF (Pacemaker) ×2 IMPLANT
PAD DEFIB LIFELINK (PAD) ×2 IMPLANT
SELECT SECURE 3830 383069 (Lead) ×2 IMPLANT
SHEATH CLASSIC 7F (SHEATH) ×2 IMPLANT
SHEATH CLASSIC 9F (SHEATH) ×2 IMPLANT
SLITTER 6232ADJ (MISCELLANEOUS) ×2 IMPLANT
TRAY PACEMAKER INSERTION (PACKS) ×2 IMPLANT
WIRE HI TORQ VERSACORE-J 145CM (WIRE) ×2 IMPLANT

## 2017-02-25 NOTE — Discharge Summary (Signed)
ELECTROPHYSIOLOGY PROCEDURE DISCHARGE SUMMARY    Patient ID: Kimberly Hood,  MRN: [id number], DOB/AGE: 1925-11-27 83 y.o.  Admit date: 02/25/2017 Discharge date: 02/26/2017  Primary Care Physician: Cletis Athens, MD Electrophysiologist: Caryl Comes  Primary Discharge Diagnosis:  Symptomatic bradycardia status post pacemaker implantation this admission  Secondary Discharge Diagnosis:  1.  Paroxysmal atrial fibrillation 2.  HTN 3.  Prior CVA  No Known Allergies   Procedures This Admission:  1.  Implantation of a STJ dual chamber PPM on 02/25/17 by Dr Lovena Le.  See op note for full details. There were no immediate post procedure complications. 2.  CXR on 02/26/17 demonstrated no pneumothorax status post device implantation.   Brief HPI: Kimberly Hood is a 83 y.o. female was referred to electrophysiology in the outpatient setting for consideration of PPM implantation.   The patient has had symptomatic bradycardia without reversible causes identified.  Risks, benefits, and alternatives to PPM implantation were reviewed with the patient who wished to proceed.   Hospital Course:  The patient was admitted and underwent implantation of a STJ dual chamber PPM with details as outlined above.  She was monitored on telemetry overnight which demonstrated sinus rhythm with V pacing.  Left chest was without hematoma or ecchymosis.  The device was interrogated and found to be functioning normally.  CXR was obtained and demonstrated no pneumothorax status post device implantation.  Wound care, arm mobility, and restrictions were reviewed with the patient.  The patient was examined and considered stable for discharge to home.    Physical Exam: Vitals:   02/25/17 1600 02/25/17 1630 02/25/17 2045 02/26/17 0328  BP: (!) 136/54 (!) 126/43 (!) 158/56 (!) 143/53  Pulse: 60 60 65 66  Resp:   18 16  Temp:   98 F (36.7 C) 97.5 F (36.4 C)  TempSrc:   Oral Oral  SpO2:   97% 96%  Weight:       Height:        GEN- The patient is well appearing, alert and oriented x 3 today.   HEENT: normocephalic, atraumatic; sclera clear, conjunctiva pink; hearing intact; oropharynx clear; neck supple  Lungs- Clear to ausculation bilaterally, normal work of breathing.  No wheezes, rales, rhonchi Heart- Regular rate and rhythm, no murmurs, rubs or gallops  GI- soft, non-tender, non-distended, bowel sounds present  Extremities- no clubbing, cyanosis, or edema  MS- no significant deformity or atrophy Skin- warm and dry, no rash or lesion, left chest without hematoma/ecchymosis Psych- euthymic mood, full affect Neuro- strength and sensation are intact   Labs:   Lab Results  Component Value Date   WBC 9.9 02/17/2017   HGB 13.9 12/23/2016   HCT 41.8 02/17/2017   MCV 95 02/17/2017   PLT 237 02/17/2017   No results for input(s): NA, K, CL, CO2, BUN, CREATININE, CALCIUM, PROT, BILITOT, ALKPHOS, ALT, AST, GLUCOSE in the last 168 hours.  Invalid input(s): LABALBU  Discharge Medications:  Allergies as of 02/26/2017   No Known Allergies     Medication List    TAKE these medications   CALCIUM PO Take 1 tablet by mouth daily.   CO-Q 10 OMEGA-3 FISH OIL PO Take 1 capsule by mouth daily.   ezetimibe 10 MG tablet Commonly known as:  ZETIA Take 10 mg by mouth daily.   FERREX 150 150 MG capsule Generic drug:  iron polysaccharides Take 150 mg by mouth daily.   furosemide 20 MG tablet Commonly known as:  LASIX Take 20  mg by mouth daily.   lisinopril 40 MG tablet Commonly known as:  PRINIVIL,ZESTRIL Take 40 mg by mouth daily.   multivitamin tablet Take 1 tablet by mouth daily.   potassium chloride SA 20 MEQ tablet Commonly known as:  K-DUR,KLOR-CON Take 20 mEq by mouth 2 (two) times daily.   simvastatin 40 MG tablet Commonly known as:  ZOCOR Take 40 mg by mouth daily.   VITAMIN D3 PO Take 1 tablet by mouth daily.   warfarin 4 MG tablet Commonly known as:   COUMADIN Take 2 mg by mouth daily.       Disposition:  Discharge Instructions    Diet - low sodium heart healthy    Complete by:  As directed    Increase activity slowly    Complete by:  As directed      Follow-up Information    [place] [facility] Follow up on 03/09/2017.   Specialty:  Cardiology Why:  at [facility] for wound check Contact information: [address] [place]       Deboraha Sprang, MD Follow up on 06/08/2017.   Specialty:  Cardiology Why:  at 10:15AM Contact information: [place] [address] [phone]           Duration of Discharge Encounter: Greater than 30 minutes including physician time.  Signed, Chanetta Marshall, NP 02/26/2017 8:06 AM  EP Attending  Patient seen and examined. Agree with above. The CXR and interogation performed under my direction looks good and she is stable for DC home. Usual followup.  Mikle Bosworth.D.

## 2017-02-25 NOTE — Discharge Instructions (Signed)
° ° °  Supplemental Discharge Instructions for  Pacemaker/Defibrillator Patients  Activity No heavy lifting or vigorous activity with your left/right arm for 6 to 8 weeks.  Do not raise your left/right arm above your head for one week.  Gradually raise your affected arm as drawn below.           __      03/01/17                    03/02/17                      03/03/17                03/04/17  NO DRIVING for  1 week   ; you may begin driving on   10/28/56  .  WOUND CARE - Keep the wound area clean and dry.  Do not get this area wet for one week. No showers for one week; you may shower on  03/04/17   . - The tape/steri-strips on your wound will fall off; do not pull them off.  No bandage is needed on the site.  DO  NOT apply any creams, oils, or ointments to the wound area. - If you notice any drainage or discharge from the wound, any swelling or bruising at the site, or you develop a fever > 101? F after you are discharged home, call the office at once.  Special Instructions - You are still able to use cellular telephones; use the ear opposite the side where you have your pacemaker/defibrillator.  Avoid carrying your cellular phone near your device. - When traveling through airports, show security personnel your identification card to avoid being screened in the metal detectors.  Ask the security personnel to use the hand wand. - Avoid arc welding equipment, MRI testing (magnetic resonance imaging), TENS units (transcutaneous nerve stimulators).  Call the office for questions about other devices. - Avoid electrical appliances that are in poor condition or are not properly grounded. - Microwave ovens are safe to be near or to operate.

## 2017-02-25 NOTE — H&P (Signed)
HPI Kimberly Hood is a 83 y.o. female  Referred for consideration of treatment for some bradycardia. She had a fall 3/18. This was abruptly  and of brief duration. She had stool up from the card table and fell without warning. She has had some episodes of lightheadedness that are brief;  she's also had episodes of dizziness that lasted 30 minutes. They are not positional.  She denies chest pain or shortness of breath. She has reasonable exercise tolerance. She does not have edema or nocturnal dyspnea.  She carries a diagnosis of paroxysmal atrial fibrillation and is anticoagulated with Coumadin; ECGs confirming this or not available  she has a history of a prior stroke dated 2009 and notes from 9937 describes "embolic phenomenon and STEMI "    Echocardiogram 2013 EF 40-45% with left ventricular hypertrophy  She has bifascicular block      Past Medical History:  Diagnosis Date  . Heart attack ([place])   . Hypertension   . MI (myocardial infarction) ([place]) 2009  . Stroke [facility])       Surgical History:       Past Surgical History:  Procedure Laterality Date  . ABDOMINAL HYSTERECTOMY    . APPENDECTOMY    . TONSILLECTOMY       Home Meds:        Prior to Admission medications   Medication Sig Start Date End Date Taking? Authorizing Provider  Coenzyme Q10-Fish Oil-Vit E (CO-Q 10 OMEGA-3 FISH OIL PO) Take 2 capsules by mouth daily.    [provider]  ezetimibe (ZETIA) 10 MG tablet Take 10 mg by mouth daily.    [provider]  furosemide (LASIX) 20 MG tablet Take 20 mg by mouth daily.    [provider]  iron polysaccharides (FERREX 150) 150 MG capsule Take 150 mg by mouth daily.    [provider]  lisinopril (PRINIVIL,ZESTRIL) 40 MG tablet Take 40 mg by mouth daily.    [provider]  potassium chloride SA (K-DUR,KLOR-CON) 20 MEQ tablet Take 20 mEq by mouth 2 (two) times daily.    [provider]  simvastatin (ZOCOR) 40 MG tablet Take 40 mg by mouth daily.    [provider]  warfarin (COUMADIN) 4 MG tablet Take 4 mg by mouth daily.    [provider]    Allergies: No Known Allergies  Social History        Social History  . Marital status: Widowed    Spouse name: N/A  . Number of children: N/A  . Years of education: N/A      Occupational History  . Not on file.       Social History Main Topics  . Smoking status: Never Smoker  . Smokeless tobacco: Never Used  . Alcohol use No  . Drug use: No  . Sexual activity: Not on file       Other Topics Concern  . Not on file      Social History Narrative  . No narrative on file          Family History  Problem Relation Age of Onset  . Diabetes Mother   . Heart attack Mother   . Heart attack Father   . Brain cancer Sister   . Hypertension Sister      ROS:  Please see the history of present illness.     All other systems reviewed and negative.    Physical Exam: Blood pressure (!) 146/48, pulse Marland Kitchen)  49, height 5' (1.524 m), weight 121 lb 12 oz (55.2 kg). General: Well developed, well nourished female in no acute distress. Head: Normocephalic, atraumatic, sclera non-icteric, no xanthomas, nares are without discharge. EENT: normal  Lymph Nodes:  none Neck: Bilateral bruits versus transmitted murmur parvus and tardus  JVD not elevated. Back:with kyphosis  Lungs: Clear bilaterally to auscultation without wheezes, rales, or rhonchi. Breathing is unlabored. Heart: RRR with S1 S2. 4/6 systolic  murmur . No rubs, or gallops appreciated. Abdomen: Soft, non-tender, non-distended with normoactive bowel sounds. No hepatomegaly. No rebound/guarding. No obvious abdominal masses. Msk:  Strength and tone appear normal for age. Extremities: No clubbing or cyanosis. No * edema.  Distal pedal pulses are 2+ and equal bilaterally. Skin: Warm and Dry Neuro: Alert and  oriented X 3. CN III-XII intact Grossly normal sensory and motor function . Psych:  Responds to questions appropriately with a normal affect.                 Labs: Cardiac Enzymes Recent Labs (last 2 labs)   No results for input(s): CKTOTAL, CKMB, TROPONINI in the last 72 hours.   CBC Recent Labs       Lab Results  Component Value Date   WBC 7.9 12/23/2016   HGB 13.9 12/23/2016   HCT 41.2 12/23/2016   MCV 92.8 12/23/2016   PLT 206 12/23/2016     PROTIME: Recent Labs (last 2 labs)   No results for input(s): LABPROT, INR in the last 72 hours.   Chemistry  Last Labs   No results for input(s): NA, K, CL, CO2, BUN, CREATININE, CALCIUM, PROT, BILITOT, ALKPHOS, ALT, AST, GLUCOSE in the last 168 hours.  Invalid input(s): LABALBU   Lipids Recent Labs  No results found for: CHOL, HDL, LDLCALC, TRIG   BNP Last Labs  No results found for: PROBNP   Thyroid Function Tests:  Recent Labs (last 2 labs)   No results for input(s): TSH, T4TOTAL, T3FREE, THYROIDAB in the last 72 hours.  Invalid input(s): FREET3   Miscellaneous Recent Labs  No results found for: DDIMER    Radiology/Studies:  Imaging Results  No results found.    PZW:CHENI @ 49 19/13/48 100   Assessment and Plan:   Sinus bradycardia  Syncope   Bifascicular block right bundle branch left posterior fascicular block  Atrial fibrillation by report  Stroke  Warfarin therapy    The patient has had syncope and a fall in the context of documented sinus bradycardia into the 20s and bifascicular block. Pacing is indicated. On the event itself may have been associated with vasomotor instability, class IIa recommendations were made for pacing  This will also allow [place] to documented atrial fibrillation although the context of her stroke I'm not sure that her anticoagulations would be discontinued.  No bleeding issues.      Virl Axe  EP Attending  Patient seen  and examined. Agree with above. The patient has symptomatic bradycardia due to 2:1 AV block. I have discussed the indications risk/benefits/goals/expectations of PPM insertion and she wishes to proceed.   Mikle Bosworth.D.

## 2017-02-26 ENCOUNTER — Encounter (HOSPITAL_COMMUNITY): Payer: Self-pay | Admitting: General Practice

## 2017-02-26 ENCOUNTER — Observation Stay (HOSPITAL_COMMUNITY): Payer: Medicare Other

## 2017-02-26 DIAGNOSIS — R55 Syncope and collapse: Secondary | ICD-10-CM | POA: Diagnosis not present

## 2017-02-26 DIAGNOSIS — I252 Old myocardial infarction: Secondary | ICD-10-CM | POA: Diagnosis not present

## 2017-02-26 DIAGNOSIS — R001 Bradycardia, unspecified: Secondary | ICD-10-CM | POA: Diagnosis not present

## 2017-02-26 DIAGNOSIS — J9811 Atelectasis: Secondary | ICD-10-CM | POA: Diagnosis not present

## 2017-02-26 DIAGNOSIS — I445 Left posterior fascicular block: Secondary | ICD-10-CM | POA: Diagnosis not present

## 2017-02-26 DIAGNOSIS — I441 Atrioventricular block, second degree: Secondary | ICD-10-CM | POA: Diagnosis not present

## 2017-02-26 DIAGNOSIS — Z8673 Personal history of transient ischemic attack (TIA), and cerebral infarction without residual deficits: Secondary | ICD-10-CM | POA: Diagnosis not present

## 2017-02-26 DIAGNOSIS — I10 Essential (primary) hypertension: Secondary | ICD-10-CM | POA: Diagnosis not present

## 2017-02-26 DIAGNOSIS — I452 Bifascicular block: Secondary | ICD-10-CM | POA: Diagnosis not present

## 2017-02-26 DIAGNOSIS — I480 Paroxysmal atrial fibrillation: Secondary | ICD-10-CM | POA: Diagnosis not present

## 2017-02-26 DIAGNOSIS — Z7901 Long term (current) use of anticoagulants: Secondary | ICD-10-CM | POA: Diagnosis not present

## 2017-03-02 DIAGNOSIS — Z950 Presence of cardiac pacemaker: Secondary | ICD-10-CM | POA: Diagnosis not present

## 2017-03-02 DIAGNOSIS — I10 Essential (primary) hypertension: Secondary | ICD-10-CM | POA: Diagnosis not present

## 2017-03-02 DIAGNOSIS — H539 Unspecified visual disturbance: Secondary | ICD-10-CM | POA: Diagnosis not present

## 2017-03-02 DIAGNOSIS — I69398 Other sequelae of cerebral infarction: Secondary | ICD-10-CM | POA: Diagnosis not present

## 2017-03-02 DIAGNOSIS — I4891 Unspecified atrial fibrillation: Secondary | ICD-10-CM | POA: Diagnosis not present

## 2017-03-03 DIAGNOSIS — L821 Other seborrheic keratosis: Secondary | ICD-10-CM | POA: Diagnosis not present

## 2017-03-03 DIAGNOSIS — Z85828 Personal history of other malignant neoplasm of skin: Secondary | ICD-10-CM | POA: Diagnosis not present

## 2017-03-03 DIAGNOSIS — D2272 Melanocytic nevi of left lower limb, including hip: Secondary | ICD-10-CM | POA: Diagnosis not present

## 2017-03-03 DIAGNOSIS — D225 Melanocytic nevi of trunk: Secondary | ICD-10-CM | POA: Diagnosis not present

## 2017-03-03 DIAGNOSIS — I10 Essential (primary) hypertension: Secondary | ICD-10-CM | POA: Diagnosis not present

## 2017-03-09 ENCOUNTER — Ambulatory Visit (INDEPENDENT_AMBULATORY_CARE_PROVIDER_SITE_OTHER): Payer: Medicare Other | Admitting: *Deleted

## 2017-03-09 DIAGNOSIS — R001 Bradycardia, unspecified: Secondary | ICD-10-CM | POA: Diagnosis not present

## 2017-03-09 LAB — CUP PACEART INCLINIC DEVICE CHECK
Date Time Interrogation Session: 20180605152435
Implantable Lead Implant Date: 20180524
Implantable Lead Implant Date: 20180524
Implantable Lead Location: 753859
Implantable Lead Location: 753860
Implantable Lead Model: 3830
Implantable Pulse Generator Implant Date: 20180524
Lead Channel Impedance Value: 537.5 Ohm
Lead Channel Pacing Threshold Amplitude: 0.75 V
Lead Channel Pacing Threshold Pulse Width: 1 ms
Lead Channel Sensing Intrinsic Amplitude: 12 mV
Lead Channel Sensing Intrinsic Amplitude: 5 mV
Lead Channel Setting Pacing Amplitude: 3.5 V
Lead Channel Setting Pacing Pulse Width: 1 ms
Lead Channel Setting Sensing Sensitivity: 2 mV
MDC IDC MSMT BATTERY REMAINING LONGEVITY: 46 mo
MDC IDC MSMT BATTERY VOLTAGE: 3.05 V
MDC IDC MSMT LEADCHNL RA IMPEDANCE VALUE: 587.5 Ohm
MDC IDC MSMT LEADCHNL RA PACING THRESHOLD AMPLITUDE: 0.75 V
MDC IDC MSMT LEADCHNL RA PACING THRESHOLD PULSEWIDTH: 0.5 ms
MDC IDC SET LEADCHNL RV PACING AMPLITUDE: 3.5 V
MDC IDC STAT BRADY RA PERCENT PACED: 99.92 %
MDC IDC STAT BRADY RV PERCENT PACED: 99.93 %
Pulse Gen Model: 2272
Pulse Gen Serial Number: 8911338

## 2017-03-09 NOTE — Progress Notes (Signed)
Wound check appointment. Steri-strips removed. Wound without redness or edema. Incision edges approximated, wound well healed. Normal device function. Thresholds, sensing, and impedances consistent with implant measurements. EKG shows selective capture of HIS until loss of capture. EKG reviewed with WC - no changes made. Device programmed at 3.5V until 3 month visit. Histogram distribution appropriate for patient and level of activity. 14 mode switches - atach. No high ventricular rates noted. Patient educated about wound care, arm mobility, lifting restrictions. ROV in 3 months with SK 9/4

## 2017-03-15 DIAGNOSIS — I059 Rheumatic mitral valve disease, unspecified: Secondary | ICD-10-CM | POA: Diagnosis not present

## 2017-03-15 DIAGNOSIS — I10 Essential (primary) hypertension: Secondary | ICD-10-CM | POA: Diagnosis not present

## 2017-03-15 DIAGNOSIS — E784 Other hyperlipidemia: Secondary | ICD-10-CM | POA: Diagnosis not present

## 2017-03-15 DIAGNOSIS — I4891 Unspecified atrial fibrillation: Secondary | ICD-10-CM | POA: Diagnosis not present

## 2017-04-13 DIAGNOSIS — I059 Rheumatic mitral valve disease, unspecified: Secondary | ICD-10-CM | POA: Diagnosis not present

## 2017-04-13 DIAGNOSIS — I4891 Unspecified atrial fibrillation: Secondary | ICD-10-CM | POA: Diagnosis not present

## 2017-04-13 DIAGNOSIS — I10 Essential (primary) hypertension: Secondary | ICD-10-CM | POA: Diagnosis not present

## 2017-04-13 DIAGNOSIS — E784 Other hyperlipidemia: Secondary | ICD-10-CM | POA: Diagnosis not present

## 2017-05-27 DIAGNOSIS — I059 Rheumatic mitral valve disease, unspecified: Secondary | ICD-10-CM | POA: Diagnosis not present

## 2017-05-27 DIAGNOSIS — I4891 Unspecified atrial fibrillation: Secondary | ICD-10-CM | POA: Diagnosis not present

## 2017-05-27 DIAGNOSIS — E8881 Metabolic syndrome: Secondary | ICD-10-CM | POA: Diagnosis not present

## 2017-05-27 DIAGNOSIS — I517 Cardiomegaly: Secondary | ICD-10-CM | POA: Diagnosis not present

## 2017-06-08 ENCOUNTER — Ambulatory Visit (INDEPENDENT_AMBULATORY_CARE_PROVIDER_SITE_OTHER): Payer: Medicare Other | Admitting: Internal Medicine

## 2017-06-08 ENCOUNTER — Encounter: Payer: Self-pay | Admitting: Internal Medicine

## 2017-06-08 VITALS — BP 100/52 | HR 66 | Ht 59.0 in | Wt 117.5 lb

## 2017-06-08 DIAGNOSIS — I441 Atrioventricular block, second degree: Secondary | ICD-10-CM

## 2017-06-08 DIAGNOSIS — R001 Bradycardia, unspecified: Secondary | ICD-10-CM | POA: Diagnosis not present

## 2017-06-08 DIAGNOSIS — I452 Bifascicular block: Secondary | ICD-10-CM

## 2017-06-08 DIAGNOSIS — Z950 Presence of cardiac pacemaker: Secondary | ICD-10-CM

## 2017-06-08 LAB — CUP PACEART INCLINIC DEVICE CHECK
Battery Remaining Longevity: 98 mo
Brady Statistic RA Percent Paced: 99.89 %
Brady Statistic RV Percent Paced: 99.89 %
Implantable Lead Implant Date: 20180524
Implantable Lead Implant Date: 20180524
Implantable Lead Location: 753859
Implantable Lead Location: 753860
Implantable Lead Model: 3830
Lead Channel Pacing Threshold Amplitude: 0.75 V
Lead Channel Pacing Threshold Pulse Width: 0.5 ms
Lead Channel Pacing Threshold Pulse Width: 1 ms
Lead Channel Sensing Intrinsic Amplitude: 12 mV
Lead Channel Sensing Intrinsic Amplitude: 3.6 mV
Lead Channel Setting Pacing Amplitude: 2.5 V
MDC IDC MSMT BATTERY VOLTAGE: 3.01 V
MDC IDC MSMT LEADCHNL RA IMPEDANCE VALUE: 575 Ohm
MDC IDC MSMT LEADCHNL RV IMPEDANCE VALUE: 600 Ohm
MDC IDC MSMT LEADCHNL RV PACING THRESHOLD AMPLITUDE: 0.5 V
MDC IDC PG IMPLANT DT: 20180524
MDC IDC PG SERIAL: 8911338
MDC IDC SESS DTM: 20180904135922
MDC IDC SET LEADCHNL RA PACING AMPLITUDE: 2 V
MDC IDC SET LEADCHNL RV PACING PULSEWIDTH: 0.5 ms
MDC IDC SET LEADCHNL RV SENSING SENSITIVITY: 2 mV

## 2017-06-08 NOTE — Progress Notes (Signed)
Patient Care Team: Cletis Athens, MD as PCP - General (Cardiology)   HPI  Kimberly Hood is a 83 y.o. female Seen in followup for pacemaker ([employer] His Bundle)  implanted for syncope with bifascicular block (5/18) Prior hx of reported AFib with CVA on warfarin.     5/18 Echo EF 60-65%  Date K Cr Hgb  5/18 4.6 1.19 13.9           The patient denies chest pain, shortness of breath, nocturnal dyspnea, orthopnea or peripheral edema.  There have been no palpitations, lightheadedness or syncope.  Tolerating pacer wiothout issue  No bleeding   Past Medical History:  Diagnosis Date  . Heart attack ([place])   . Hypertension   . MI (myocardial infarction) ([place]) 2009  . Presence of permanent cardiac pacemaker 02/24/2017  . Stroke [facility])     Past Surgical History:  Procedure Laterality Date  . ABDOMINAL HYSTERECTOMY    . APPENDECTOMY    . PACEMAKER IMPLANT N/A 02/25/2017   Procedure: Pacemaker Implant;  Surgeon: Evans Lance, MD;  Location: [place] [facility];  Service: Cardiovascular;  Laterality: N/A;  . TONSILLECTOMY      Current Outpatient Prescriptions  Medication Sig Dispense Refill  . CALCIUM PO Take 1 tablet by mouth daily.    . Cholecalciferol (VITAMIN D3 PO) Take 1 tablet by mouth daily.    . Coenzyme Q10-Fish Oil-Vit E (CO-Q 10 OMEGA-3 FISH OIL PO) Take 1 capsule by mouth daily.     Marland Kitchen ezetimibe (ZETIA) 10 MG tablet Take 10 mg by mouth daily.    . furosemide (LASIX) 20 MG tablet Take 20 mg by mouth daily.    . iron polysaccharides (FERREX 150) 150 MG capsule Take 150 mg by mouth daily.    Marland Kitchen lisinopril (PRINIVIL,ZESTRIL) 40 MG tablet Take 40 mg by mouth daily.    . Multiple Vitamin (MULTIVITAMIN) tablet Take 1 tablet by mouth daily.    . potassium chloride SA (K-DUR,KLOR-CON) 20 MEQ tablet Take 20 mEq by mouth 2 (two) times daily.    . simvastatin (ZOCOR) 40 MG tablet Take 40 mg by mouth daily.    Marland Kitchen warfarin (COUMADIN) 4 MG tablet Take 2 mg by mouth  daily.      No current facility-administered medications for this visit.     No Known Allergies    Review of Systems negative except from HPI and PMH  Physical Exam BP (!) 100/52 (BP Location: Left Arm, Patient Position: Sitting, Cuff Size: Normal)   Pulse 66   Ht 4\' 11"  (1.499 m)   Wt 117 lb 8 oz (53.3 kg)   BMI 23.73 kg/m  Well developed and well nourished in no acute distress HENT normal E scleral and icterus clear Neck Supple JVP flat; carotids brisk and full  B Bruits v tramsmitted m Clear to ausculation Device pocket well healed; without hematoma or erythema.  There is no tethering  Regular rate and rhythm, 2/6 murmur Soft with active bowel sounds No clubbing cyanosis {  Edema Alert and oriented, grossly normal motor and sensory function Skin Warm and Dry   ECG  AV pacing  Assessment and  Plan  Sinus bradycardia  Syncope   Bifascicular block right bundle branch left posterior fascicular block  Atrial fibrillation by report  Stroke  Warfarin therapy  On Anticoagulation;  No bleeding issues   .nosyncope  No intercurrent atrial fibrillation or flutter     Current medicines are reviewed at  length with the patient today .  The patient does not  have concerns regarding medicines.

## 2017-06-08 NOTE — Patient Instructions (Signed)
Medication Instructions: - Your physician recommends that you continue on your current medications as directed. Please refer to the Current Medication list given to you today.  Labwork: - none ordered  Procedures/Testing: - none ordered  Follow-Up: - Remote monitoring is used to monitor your Pacemaker of ICD from home. This monitoring reduces the number of office visits required to check your device to one time per year. It allows [place] to keep an eye on the functioning of your device to ensure it is working properly. You are scheduled for a device check from home on 09/07/17. You may send your transmission at any time that day. If you have a wireless device, the transmission will be sent automatically. After your physician reviews your transmission, you will receive a postcard with your next transmission date.  - Your physician wants you to follow-up in: 9 months with Dr. Caryl Comes. You will receive a reminder letter in the mail two months in advance. If you don't receive a letter, please call our office to schedule the follow-up appointment.   Any Additional Special Instructions Will Be Listed Below (If Applicable).     If you need a refill on your cardiac medications before your next appointment, please call your pharmacy.

## 2017-06-25 DIAGNOSIS — E784 Other hyperlipidemia: Secondary | ICD-10-CM | POA: Diagnosis not present

## 2017-06-25 DIAGNOSIS — Z23 Encounter for immunization: Secondary | ICD-10-CM | POA: Diagnosis not present

## 2017-06-25 DIAGNOSIS — I209 Angina pectoris, unspecified: Secondary | ICD-10-CM | POA: Diagnosis not present

## 2017-06-25 DIAGNOSIS — I059 Rheumatic mitral valve disease, unspecified: Secondary | ICD-10-CM | POA: Diagnosis not present

## 2017-06-25 DIAGNOSIS — I4891 Unspecified atrial fibrillation: Secondary | ICD-10-CM | POA: Diagnosis not present

## 2017-09-07 ENCOUNTER — Ambulatory Visit (INDEPENDENT_AMBULATORY_CARE_PROVIDER_SITE_OTHER): Payer: Medicare Other | Admitting: *Deleted

## 2017-09-07 DIAGNOSIS — I441 Atrioventricular block, second degree: Secondary | ICD-10-CM | POA: Diagnosis not present

## 2017-09-07 LAB — CUP PACEART REMOTE DEVICE CHECK
Battery Remaining Longevity: 98 mo
Battery Remaining Percentage: 95.5 %
Battery Voltage: 3.04 V
Brady Statistic AS VP Percent: 1 %
Brady Statistic RA Percent Paced: 99 %
Date Time Interrogation Session: 20181204070015
Implantable Lead Location: 753859
Implantable Lead Model: 3830
Implantable Pulse Generator Implant Date: 20180524
Lead Channel Impedance Value: 490 Ohm
Lead Channel Pacing Threshold Amplitude: 0.75 V
Lead Channel Pacing Threshold Pulse Width: 0.5 ms
Lead Channel Setting Pacing Amplitude: 2 V
MDC IDC LEAD IMPLANT DT: 20180524
MDC IDC LEAD IMPLANT DT: 20180524
MDC IDC LEAD LOCATION: 753860
MDC IDC MSMT LEADCHNL RA SENSING INTR AMPL: 1.6 mV
MDC IDC MSMT LEADCHNL RV IMPEDANCE VALUE: 550 Ohm
MDC IDC MSMT LEADCHNL RV PACING THRESHOLD AMPLITUDE: 0.5 V
MDC IDC MSMT LEADCHNL RV PACING THRESHOLD PULSEWIDTH: 1 ms
MDC IDC MSMT LEADCHNL RV SENSING INTR AMPL: 12 mV
MDC IDC SET LEADCHNL RV PACING AMPLITUDE: 2.5 V
MDC IDC SET LEADCHNL RV PACING PULSEWIDTH: 0.5 ms
MDC IDC SET LEADCHNL RV SENSING SENSITIVITY: 2 mV
MDC IDC STAT BRADY AP VP PERCENT: 99 %
MDC IDC STAT BRADY AP VS PERCENT: 1 %
MDC IDC STAT BRADY AS VS PERCENT: 1 %
MDC IDC STAT BRADY RV PERCENT PACED: 99 %
Pulse Gen Model: 2272
Pulse Gen Serial Number: 8911338

## 2017-09-07 NOTE — Progress Notes (Signed)
Remote pacemaker transmission.   

## 2017-09-08 ENCOUNTER — Encounter: Payer: Self-pay | Admitting: Cardiology

## 2017-09-09 DIAGNOSIS — I4891 Unspecified atrial fibrillation: Secondary | ICD-10-CM | POA: Diagnosis not present

## 2017-09-09 DIAGNOSIS — I059 Rheumatic mitral valve disease, unspecified: Secondary | ICD-10-CM | POA: Diagnosis not present

## 2017-09-09 DIAGNOSIS — E669 Obesity, unspecified: Secondary | ICD-10-CM | POA: Diagnosis not present

## 2017-09-09 DIAGNOSIS — I209 Angina pectoris, unspecified: Secondary | ICD-10-CM | POA: Diagnosis not present

## 2017-10-01 DIAGNOSIS — F0390 Unspecified dementia without behavioral disturbance: Secondary | ICD-10-CM | POA: Diagnosis not present

## 2017-10-01 DIAGNOSIS — R413 Other amnesia: Secondary | ICD-10-CM | POA: Diagnosis not present

## 2017-10-13 DIAGNOSIS — I4891 Unspecified atrial fibrillation: Secondary | ICD-10-CM | POA: Diagnosis not present

## 2017-10-13 DIAGNOSIS — E669 Obesity, unspecified: Secondary | ICD-10-CM | POA: Diagnosis not present

## 2017-10-13 DIAGNOSIS — I517 Cardiomegaly: Secondary | ICD-10-CM | POA: Diagnosis not present

## 2017-10-13 DIAGNOSIS — I209 Angina pectoris, unspecified: Secondary | ICD-10-CM | POA: Diagnosis not present

## 2017-10-13 DIAGNOSIS — I059 Rheumatic mitral valve disease, unspecified: Secondary | ICD-10-CM | POA: Diagnosis not present

## 2017-11-16 ENCOUNTER — Other Ambulatory Visit: Payer: Self-pay | Admitting: Internal Medicine

## 2017-11-16 ENCOUNTER — Ambulatory Visit
Admission: RE | Admit: 2017-11-16 | Discharge: 2017-11-16 | Disposition: A | Discharge status: Home / Self Care | Payer: Medicare Other | Source: Ambulatory Visit | Attending: Internal Medicine | Admitting: Internal Medicine

## 2017-11-16 DIAGNOSIS — M79621 Pain in right upper arm: Secondary | ICD-10-CM | POA: Diagnosis not present

## 2017-11-16 DIAGNOSIS — I517 Cardiomegaly: Secondary | ICD-10-CM | POA: Diagnosis not present

## 2017-11-16 DIAGNOSIS — M25811 Other specified joint disorders, right shoulder: Secondary | ICD-10-CM | POA: Insufficient documentation

## 2017-11-16 DIAGNOSIS — I059 Rheumatic mitral valve disease, unspecified: Secondary | ICD-10-CM | POA: Diagnosis not present

## 2017-11-16 DIAGNOSIS — M25521 Pain in right elbow: Secondary | ICD-10-CM | POA: Diagnosis not present

## 2017-11-16 DIAGNOSIS — I4891 Unspecified atrial fibrillation: Secondary | ICD-10-CM | POA: Diagnosis not present

## 2017-11-16 DIAGNOSIS — I209 Angina pectoris, unspecified: Secondary | ICD-10-CM | POA: Diagnosis not present

## 2017-11-16 DIAGNOSIS — M19011 Primary osteoarthritis, right shoulder: Secondary | ICD-10-CM | POA: Diagnosis not present

## 2017-11-16 DIAGNOSIS — R52 Pain, unspecified: Secondary | ICD-10-CM

## 2017-11-16 DIAGNOSIS — M25511 Pain in right shoulder: Secondary | ICD-10-CM | POA: Diagnosis not present

## 2017-11-30 DIAGNOSIS — M25811 Other specified joint disorders, right shoulder: Secondary | ICD-10-CM | POA: Diagnosis not present

## 2017-12-07 ENCOUNTER — Ambulatory Visit (INDEPENDENT_AMBULATORY_CARE_PROVIDER_SITE_OTHER): Payer: Medicare Other | Admitting: *Deleted

## 2017-12-07 DIAGNOSIS — I441 Atrioventricular block, second degree: Secondary | ICD-10-CM

## 2017-12-07 NOTE — Progress Notes (Signed)
Remote pacemaker transmission.   

## 2017-12-08 ENCOUNTER — Encounter: Payer: Self-pay | Admitting: Cardiology

## 2017-12-09 LAB — CUP PACEART REMOTE DEVICE CHECK
Date Time Interrogation Session: 20190307102721
Implantable Lead Implant Date: 20180524
Implantable Lead Location: 753859
MDC IDC LEAD IMPLANT DT: 20180524
MDC IDC LEAD LOCATION: 753860
MDC IDC PG IMPLANT DT: 20180524
MDC IDC PG SERIAL: 8911338
Pulse Gen Model: 2272

## 2017-12-22 DIAGNOSIS — I4891 Unspecified atrial fibrillation: Secondary | ICD-10-CM | POA: Diagnosis not present

## 2017-12-22 DIAGNOSIS — I059 Rheumatic mitral valve disease, unspecified: Secondary | ICD-10-CM | POA: Diagnosis not present

## 2017-12-22 DIAGNOSIS — I209 Angina pectoris, unspecified: Secondary | ICD-10-CM | POA: Diagnosis not present

## 2017-12-22 DIAGNOSIS — I517 Cardiomegaly: Secondary | ICD-10-CM | POA: Diagnosis not present

## 2018-01-04 DIAGNOSIS — M25811 Other specified joint disorders, right shoulder: Secondary | ICD-10-CM | POA: Diagnosis not present

## 2018-01-06 DIAGNOSIS — M7541 Impingement syndrome of right shoulder: Secondary | ICD-10-CM | POA: Diagnosis not present

## 2018-01-06 DIAGNOSIS — M25511 Pain in right shoulder: Secondary | ICD-10-CM | POA: Diagnosis not present

## 2018-01-06 DIAGNOSIS — R609 Edema, unspecified: Secondary | ICD-10-CM | POA: Diagnosis not present

## 2018-01-20 DIAGNOSIS — I209 Angina pectoris, unspecified: Secondary | ICD-10-CM | POA: Diagnosis not present

## 2018-01-20 DIAGNOSIS — I517 Cardiomegaly: Secondary | ICD-10-CM | POA: Diagnosis not present

## 2018-01-20 DIAGNOSIS — I059 Rheumatic mitral valve disease, unspecified: Secondary | ICD-10-CM | POA: Diagnosis not present

## 2018-01-20 DIAGNOSIS — I4891 Unspecified atrial fibrillation: Secondary | ICD-10-CM | POA: Diagnosis not present

## 2018-01-25 DIAGNOSIS — M25511 Pain in right shoulder: Secondary | ICD-10-CM | POA: Diagnosis not present

## 2018-01-25 DIAGNOSIS — M7541 Impingement syndrome of right shoulder: Secondary | ICD-10-CM | POA: Diagnosis not present

## 2018-01-25 DIAGNOSIS — R609 Edema, unspecified: Secondary | ICD-10-CM | POA: Diagnosis not present

## 2018-01-27 DIAGNOSIS — M7541 Impingement syndrome of right shoulder: Secondary | ICD-10-CM | POA: Diagnosis not present

## 2018-01-27 DIAGNOSIS — M25511 Pain in right shoulder: Secondary | ICD-10-CM | POA: Diagnosis not present

## 2018-02-01 DIAGNOSIS — M25511 Pain in right shoulder: Secondary | ICD-10-CM | POA: Diagnosis not present

## 2018-02-01 DIAGNOSIS — M7541 Impingement syndrome of right shoulder: Secondary | ICD-10-CM | POA: Diagnosis not present

## 2018-02-04 DIAGNOSIS — M25511 Pain in right shoulder: Secondary | ICD-10-CM | POA: Diagnosis not present

## 2018-02-04 DIAGNOSIS — M7541 Impingement syndrome of right shoulder: Secondary | ICD-10-CM | POA: Diagnosis not present

## 2018-03-02 DIAGNOSIS — D2262 Melanocytic nevi of left upper limb, including shoulder: Secondary | ICD-10-CM | POA: Diagnosis not present

## 2018-03-02 DIAGNOSIS — Z08 Encounter for follow-up examination after completed treatment for malignant neoplasm: Secondary | ICD-10-CM | POA: Diagnosis not present

## 2018-03-02 DIAGNOSIS — Z85828 Personal history of other malignant neoplasm of skin: Secondary | ICD-10-CM | POA: Diagnosis not present

## 2018-03-02 DIAGNOSIS — D485 Neoplasm of uncertain behavior of skin: Secondary | ICD-10-CM | POA: Diagnosis not present

## 2018-03-02 DIAGNOSIS — D2272 Melanocytic nevi of left lower limb, including hip: Secondary | ICD-10-CM | POA: Diagnosis not present

## 2018-03-02 DIAGNOSIS — L853 Xerosis cutis: Secondary | ICD-10-CM | POA: Diagnosis not present

## 2018-03-02 DIAGNOSIS — L570 Actinic keratosis: Secondary | ICD-10-CM | POA: Diagnosis not present

## 2018-03-02 DIAGNOSIS — L298 Other pruritus: Secondary | ICD-10-CM | POA: Diagnosis not present

## 2018-03-02 DIAGNOSIS — X32XXXA Exposure to sunlight, initial encounter: Secondary | ICD-10-CM | POA: Diagnosis not present

## 2018-03-02 DIAGNOSIS — L821 Other seborrheic keratosis: Secondary | ICD-10-CM | POA: Diagnosis not present

## 2018-03-02 DIAGNOSIS — D2261 Melanocytic nevi of right upper limb, including shoulder: Secondary | ICD-10-CM | POA: Diagnosis not present

## 2018-03-02 DIAGNOSIS — L720 Epidermal cyst: Secondary | ICD-10-CM | POA: Diagnosis not present

## 2018-03-02 DIAGNOSIS — D2271 Melanocytic nevi of right lower limb, including hip: Secondary | ICD-10-CM | POA: Diagnosis not present

## 2018-03-03 DIAGNOSIS — I209 Angina pectoris, unspecified: Secondary | ICD-10-CM | POA: Diagnosis not present

## 2018-03-03 DIAGNOSIS — I517 Cardiomegaly: Secondary | ICD-10-CM | POA: Diagnosis not present

## 2018-03-03 DIAGNOSIS — I059 Rheumatic mitral valve disease, unspecified: Secondary | ICD-10-CM | POA: Diagnosis not present

## 2018-03-03 DIAGNOSIS — I4891 Unspecified atrial fibrillation: Secondary | ICD-10-CM | POA: Diagnosis not present

## 2018-03-08 ENCOUNTER — Encounter: Payer: Self-pay | Admitting: Internal Medicine

## 2018-03-08 ENCOUNTER — Ambulatory Visit (INDEPENDENT_AMBULATORY_CARE_PROVIDER_SITE_OTHER): Payer: Medicare Other | Admitting: *Deleted

## 2018-03-08 ENCOUNTER — Ambulatory Visit (INDEPENDENT_AMBULATORY_CARE_PROVIDER_SITE_OTHER): Payer: Medicare Other | Admitting: Internal Medicine

## 2018-03-08 VITALS — BP 108/60 | HR 60 | Ht <= 58 in | Wt 114.5 lb

## 2018-03-08 DIAGNOSIS — Z950 Presence of cardiac pacemaker: Secondary | ICD-10-CM | POA: Diagnosis not present

## 2018-03-08 DIAGNOSIS — R001 Bradycardia, unspecified: Secondary | ICD-10-CM | POA: Diagnosis not present

## 2018-03-08 DIAGNOSIS — I480 Paroxysmal atrial fibrillation: Secondary | ICD-10-CM | POA: Diagnosis not present

## 2018-03-08 DIAGNOSIS — Z79899 Other long term (current) drug therapy: Secondary | ICD-10-CM | POA: Diagnosis not present

## 2018-03-08 DIAGNOSIS — I441 Atrioventricular block, second degree: Secondary | ICD-10-CM | POA: Diagnosis not present

## 2018-03-08 DIAGNOSIS — I452 Bifascicular block: Secondary | ICD-10-CM

## 2018-03-08 MED ORDER — AMIODARONE HCL 100 MG PO TABS: 100.0000 mg | ORAL_TABLET | Freq: Every day | ORAL | 3 refills | Status: DC

## 2018-03-08 NOTE — Addendum Note (Signed)
Addended byAlvis Lemmings C on: 03/08/2018 11:25 AM   Modules accepted: Orders

## 2018-03-08 NOTE — Progress Notes (Signed)
Patient Care Team: Cletis Athens, MD as PCP - General (Cardiology)   HPI  Kimberly Hood is a 84 y.o. female Seen in followup for pacemaker ([employer]  His Bundle)  implanted for syncope with bifascicular block (5/18) Prior hx of reported AFib with CVA on warfarin.   No interval synccope but orthostatic lightheadedness  No chest pain or shortness of breath  No edema or palps  Superficial bruising but no significant bleeding   INR labs reviewed  5/18 Echo EF 60-65%  Date K Cr Hgb  5/18 4.6 1.19 13.9            No bleeding   Past Medical History:  Diagnosis Date  . Heart attack ([place])   . Hypertension   . MI (myocardial infarction) ([place]) 2009  . Presence of permanent cardiac pacemaker 02/24/2017  . Stroke [facility])     Past Surgical History:  Procedure Laterality Date  . ABDOMINAL HYSTERECTOMY    . APPENDECTOMY    . PACEMAKER IMPLANT N/A 02/25/2017   Procedure: Pacemaker Implant;  Surgeon: Evans Lance, MD;  Location: [place] [facility];  Service: Cardiovascular;  Laterality: N/A;  . TONSILLECTOMY      Current Outpatient Medications  Medication Sig Dispense Refill  . amiodarone (PACERONE) 200 MG tablet Take 200 mg by mouth daily.    Marland Kitchen amLODipine (NORVASC) 2.5 MG tablet Take 2.5 mg by mouth daily.    Marland Kitchen CALCIUM PO Take 1 tablet by mouth daily.    . Cholecalciferol (VITAMIN D3 PO) Take 1 tablet by mouth daily.    . Coenzyme Q10-Fish Oil-Vit E (CO-Q 10 OMEGA-3 FISH OIL PO) Take 1 capsule by mouth daily.     Marland Kitchen donepezil (ARICEPT) 5 MG tablet Take 5 mg by mouth at bedtime.    Marland Kitchen ezetimibe (ZETIA) 10 MG tablet Take 10 mg by mouth daily.    . furosemide (LASIX) 20 MG tablet Take 10 mg by mouth daily.     Marland Kitchen lisinopril (PRINIVIL,ZESTRIL) 40 MG tablet Take 40 mg by mouth daily.    . Multiple Vitamin (MULTIVITAMIN) tablet Take 1 tablet by mouth daily.    . potassium chloride SA (K-DUR,KLOR-CON) 20 MEQ tablet Take 20 mEq by mouth 2 (two) times daily.    .  simvastatin (ZOCOR) 40 MG tablet Take 40 mg by mouth daily.    Marland Kitchen warfarin (COUMADIN) 4 MG tablet Take 2 mg by mouth daily.      No current facility-administered medications for this visit.     No Known Allergies    Review of Systems negative except from HPI and PMH  Physical Exam BP 108/60 (BP Location: Left Arm, Patient Position: Sitting, Cuff Size: Normal)   Pulse 60   Ht 4\' 9"  (1.448 m)   Wt 114 lb 8 oz (51.9 kg)   BMI 24.78 kg/m  Well developed and nourished in no acute distress HENT normal Neck supple with JVP-flat Clear Regular rate and rhythm, no murmurs or gallops Abd-soft with active BS No Clubbing cyanosis edema Skin-warm and dry A & Oriented  Grossly normal sensory and motor function    ECG P-synchronous/ AV  pacing  Assessment and  Plan  Sinus bradycardia  Syncope   Pacemaker St Jude   Bifascicular block right bundle branch left posterior fascicular block  Atrial fibrillation by report  Stroke  Warfarin therapy  Hypertension   With BP 108 and 115 at Dr Merrilee Jansky office and with orthostatic intolerance will  have her stop her amlodipine-- I see her lisinopril has already been discontinued  Also no interval afib so will decrease amio 200>>100;  Will check amio surveillance labs  On Anticoagulation;  No bleeding issues   Check hgb n IS SHE CANDIDATE FOR NOAC Therapy  Will defer to DR JM   Will stop zetia-- no data for primary prevention of which I am  Aware in the very elderly     Current medicines are reviewed at length with the patient today .  The patient does not  have concerns regarding medicines.

## 2018-03-08 NOTE — Progress Notes (Signed)
Remote pacemaker transmission.   

## 2018-03-08 NOTE — Patient Instructions (Addendum)
Medication Instructions: - Your physician has recommended you make the following change in your medication:   1) STOP zetia (ezetimibe) 2) STOP norvasc (amlodipine) 3) DECREASE amiodarone to 100 mg- take 1 tablet (100 mg) by mouth once daily   ( you may take amiodarone 200 mg- 1/2 tablet (100 mg) by mouth once daily until you use up the 200 mg tablets)   Labwork: - Your physician recommends that you have lab work today: CMET/ TSH/ CBC   Procedures/Testing: - none ordered  Follow-Up: - Remote monitoring is used to monitor your Pacemaker of ICD from home. This monitoring reduces the number of office visits required to check your device to one time per year. It allows [place] to keep an eye on the functioning of your device to ensure it is working properly. You are scheduled for a device check from home on 06/07/18. You may send your transmission at any time that day. If you have a wireless device, the transmission will be sent automatically. After your physician reviews your transmission, you will receive a postcard with your next transmission date.  - Your physician wants you to follow-up in: May 2020 with Dr. Caryl Comes. You will receive a reminder letter in the mail two months in advance. If you don't receive a letter, please call our office to schedule the follow-up appointment.   Any Additional Special Instructions Will Be Listed Below (If Applicable).     If you need a refill on your cardiac medications before your next appointment, please call your pharmacy.

## 2018-03-09 ENCOUNTER — Other Ambulatory Visit
Admission: RE | Admit: 2018-03-09 | Discharge: 2018-03-09 | Disposition: A | Discharge status: Home / Self Care | Payer: Medicare Other | Source: Ambulatory Visit | Attending: Internal Medicine | Admitting: Internal Medicine

## 2018-03-09 DIAGNOSIS — Z79899 Other long term (current) drug therapy: Secondary | ICD-10-CM | POA: Insufficient documentation

## 2018-03-09 DIAGNOSIS — I480 Paroxysmal atrial fibrillation: Secondary | ICD-10-CM | POA: Insufficient documentation

## 2018-03-09 LAB — TSH: TSH: 2.692 u[IU]/mL (ref 0.350–4.500)

## 2018-03-09 LAB — COMPREHENSIVE METABOLIC PANEL
ALK PHOS: 64 U/L (ref 38–126)
ALT: 34 U/L (ref 14–54)
ANION GAP: 9 (ref 5–15)
AST: 34 U/L (ref 15–41)
Albumin: 3.8 g/dL (ref 3.5–5.0)
BILIRUBIN TOTAL: 0.6 mg/dL (ref 0.3–1.2)
BUN: 24 mg/dL — ABNORMAL HIGH (ref 6–20)
CALCIUM: 8.8 mg/dL — AB (ref 8.9–10.3)
CO2: 24 mmol/L (ref 22–32)
Chloride: 105 mmol/L (ref 101–111)
Creatinine, Ser: 1.44 mg/dL — ABNORMAL HIGH (ref 0.44–1.00)
GFR, EST AFRICAN AMERICAN: 36 mL/min — AB (ref >60)
GFR, EST NON AFRICAN AMERICAN: 31 mL/min — AB (ref >60)
Glucose, Bld: 181 mg/dL — ABNORMAL HIGH (ref 65–99)
Potassium: 4.4 mmol/L (ref 3.5–5.1)
SODIUM: 138 mmol/L (ref 135–145)
TOTAL PROTEIN: 6.2 g/dL — AB (ref 6.5–8.1)

## 2018-03-09 LAB — CBC WITH DIFFERENTIAL/PLATELET
BASOS ABS: 0 10*3/uL (ref 0–0.1)
BASOS PCT: 1 %
Eosinophils Absolute: 0.1 10*3/uL (ref 0–0.7)
Eosinophils Relative: 1 %
HEMATOCRIT: 36.7 % (ref 35.0–47.0)
HEMOGLOBIN: 12.5 g/dL (ref 12.0–16.0)
Lymphocytes Relative: 14 %
Lymphs Abs: 1 10*3/uL (ref 1.0–3.6)
MCH: 33 pg (ref 26.0–34.0)
MCHC: 34 g/dL (ref 32.0–36.0)
MCV: 97.1 fL (ref 80.0–100.0)
Monocytes Absolute: 0.6 10*3/uL (ref 0.2–0.9)
Monocytes Relative: 7 %
NEUTROS ABS: 6 10*3/uL (ref 1.4–6.5)
NEUTROS PCT: 77 %
Platelets: 207 10*3/uL (ref 150–440)
RBC: 3.78 MIL/uL — AB (ref 3.80–5.20)
RDW: 15.1 % — ABNORMAL HIGH (ref 11.5–14.5)
WBC: 7.8 10*3/uL (ref 3.6–11.0)

## 2018-03-10 DIAGNOSIS — M19011 Primary osteoarthritis, right shoulder: Secondary | ICD-10-CM | POA: Diagnosis not present

## 2018-03-18 DIAGNOSIS — M6281 Muscle weakness (generalized): Secondary | ICD-10-CM | POA: Diagnosis not present

## 2018-03-18 DIAGNOSIS — M19011 Primary osteoarthritis, right shoulder: Secondary | ICD-10-CM | POA: Diagnosis not present

## 2018-03-22 DIAGNOSIS — M19011 Primary osteoarthritis, right shoulder: Secondary | ICD-10-CM | POA: Diagnosis not present

## 2018-03-22 DIAGNOSIS — M6281 Muscle weakness (generalized): Secondary | ICD-10-CM | POA: Diagnosis not present

## 2018-03-23 DIAGNOSIS — I209 Angina pectoris, unspecified: Secondary | ICD-10-CM | POA: Diagnosis not present

## 2018-03-23 DIAGNOSIS — I059 Rheumatic mitral valve disease, unspecified: Secondary | ICD-10-CM | POA: Diagnosis not present

## 2018-03-23 DIAGNOSIS — I517 Cardiomegaly: Secondary | ICD-10-CM | POA: Diagnosis not present

## 2018-03-23 DIAGNOSIS — I4891 Unspecified atrial fibrillation: Secondary | ICD-10-CM | POA: Diagnosis not present

## 2018-03-24 DIAGNOSIS — M6281 Muscle weakness (generalized): Secondary | ICD-10-CM | POA: Diagnosis not present

## 2018-03-24 DIAGNOSIS — M19011 Primary osteoarthritis, right shoulder: Secondary | ICD-10-CM | POA: Diagnosis not present

## 2018-03-29 DIAGNOSIS — M6281 Muscle weakness (generalized): Secondary | ICD-10-CM | POA: Diagnosis not present

## 2018-03-29 DIAGNOSIS — M19011 Primary osteoarthritis, right shoulder: Secondary | ICD-10-CM | POA: Diagnosis not present

## 2018-03-29 LAB — CUP PACEART REMOTE DEVICE CHECK
Brady Statistic AP VS Percent: 1 %
Brady Statistic AS VP Percent: 1 %
Brady Statistic AS VS Percent: 1 %
Brady Statistic RA Percent Paced: 99 %
Brady Statistic RV Percent Paced: 99 %
Implantable Lead Implant Date: 20180524
Implantable Lead Location: 753859
Implantable Lead Location: 753860
Implantable Lead Model: 3830
Lead Channel Impedance Value: 490 Ohm
Lead Channel Pacing Threshold Amplitude: 0.5 V
Lead Channel Pacing Threshold Pulse Width: 0.5 ms
Lead Channel Pacing Threshold Pulse Width: 1 ms
Lead Channel Sensing Intrinsic Amplitude: 12 mV
Lead Channel Sensing Intrinsic Amplitude: 2.4 mV
Lead Channel Setting Pacing Amplitude: 2 V
Lead Channel Setting Pacing Amplitude: 2.5 V
Lead Channel Setting Pacing Pulse Width: 0.5 ms
Lead Channel Setting Sensing Sensitivity: 2 mV
MDC IDC LEAD IMPLANT DT: 20180524
MDC IDC MSMT BATTERY REMAINING LONGEVITY: 104 mo
MDC IDC MSMT BATTERY REMAINING PERCENTAGE: 95.5 %
MDC IDC MSMT BATTERY VOLTAGE: 3.04 V
MDC IDC MSMT LEADCHNL RA PACING THRESHOLD AMPLITUDE: 0.75 V
MDC IDC MSMT LEADCHNL RV IMPEDANCE VALUE: 540 Ohm
MDC IDC PG IMPLANT DT: 20180524
MDC IDC PG SERIAL: 8911338
MDC IDC SESS DTM: 20190604060018
MDC IDC STAT BRADY AP VP PERCENT: 99 %

## 2018-03-31 DIAGNOSIS — M6281 Muscle weakness (generalized): Secondary | ICD-10-CM | POA: Diagnosis not present

## 2018-03-31 DIAGNOSIS — M19011 Primary osteoarthritis, right shoulder: Secondary | ICD-10-CM | POA: Diagnosis not present

## 2018-04-19 DIAGNOSIS — F0390 Unspecified dementia without behavioral disturbance: Secondary | ICD-10-CM | POA: Diagnosis not present

## 2018-04-19 DIAGNOSIS — R413 Other amnesia: Secondary | ICD-10-CM | POA: Diagnosis not present

## 2018-05-23 ENCOUNTER — Emergency Department: Payer: Medicare Other

## 2018-05-23 ENCOUNTER — Emergency Department
Admission: EM | Admit: 2018-05-23 | Discharge: 2018-05-23 | Disposition: A | Discharge status: Home / Self Care | Payer: Medicare Other | Attending: Emergency Medicine | Admitting: Emergency Medicine

## 2018-05-23 DIAGNOSIS — R0789 Other chest pain: Secondary | ICD-10-CM | POA: Insufficient documentation

## 2018-05-23 DIAGNOSIS — Z79899 Other long term (current) drug therapy: Secondary | ICD-10-CM | POA: Diagnosis not present

## 2018-05-23 DIAGNOSIS — I10 Essential (primary) hypertension: Secondary | ICD-10-CM | POA: Diagnosis not present

## 2018-05-23 DIAGNOSIS — I252 Old myocardial infarction: Secondary | ICD-10-CM | POA: Insufficient documentation

## 2018-05-23 DIAGNOSIS — Z7901 Long term (current) use of anticoagulants: Secondary | ICD-10-CM | POA: Insufficient documentation

## 2018-05-23 DIAGNOSIS — Z950 Presence of cardiac pacemaker: Secondary | ICD-10-CM | POA: Diagnosis not present

## 2018-05-23 DIAGNOSIS — Z8673 Personal history of transient ischemic attack (TIA), and cerebral infarction without residual deficits: Secondary | ICD-10-CM | POA: Diagnosis not present

## 2018-05-23 DIAGNOSIS — R079 Chest pain, unspecified: Secondary | ICD-10-CM | POA: Diagnosis not present

## 2018-05-23 LAB — COMPREHENSIVE METABOLIC PANEL
ALT: 36 U/L (ref 0–44)
AST: 31 U/L (ref 15–41)
Albumin: 3.7 g/dL (ref 3.5–5.0)
Alkaline Phosphatase: 77 U/L (ref 38–126)
Anion gap: 8 (ref 5–15)
BUN: 32 mg/dL — ABNORMAL HIGH (ref 8–23)
CO2: 28 mmol/L (ref 22–32)
Calcium: 8.9 mg/dL (ref 8.9–10.3)
Chloride: 103 mmol/L (ref 98–111)
Creatinine, Ser: 1.27 mg/dL — ABNORMAL HIGH (ref 0.44–1.00)
GFR calc Af Amer: 42 mL/min — ABNORMAL LOW (ref >60)
GFR calc non Af Amer: 36 mL/min — ABNORMAL LOW (ref >60)
Glucose, Bld: 96 mg/dL (ref 70–99)
Potassium: 4.7 mmol/L (ref 3.5–5.1)
Sodium: 139 mmol/L (ref 135–145)
Total Bilirubin: 0.7 mg/dL (ref 0.3–1.2)
Total Protein: 6.4 g/dL — ABNORMAL LOW (ref 6.5–8.1)

## 2018-05-23 LAB — CBC
HCT: 39.9 % (ref 35.0–47.0)
Hemoglobin: 13.6 g/dL (ref 12.0–16.0)
MCH: 32.8 pg (ref 26.0–34.0)
MCHC: 34.2 g/dL (ref 32.0–36.0)
MCV: 96.1 fL (ref 80.0–100.0)
Platelets: 197 10*3/uL (ref 150–440)
RBC: 4.15 MIL/uL (ref 3.80–5.20)
RDW: 14.3 % (ref 11.5–14.5)
WBC: 9.3 10*3/uL (ref 3.6–11.0)

## 2018-05-23 LAB — TROPONIN I

## 2018-05-23 NOTE — ED Triage Notes (Signed)
Pt arrives via [facility] from [place] living at [facility] Pt states Chest pain that started at 0630 this morning. Pt states it is not pain just some discomfort when pt moves or breaths deep.

## 2018-05-23 NOTE — ED Provider Notes (Signed)
[facility] Emergency Department Provider Note  Time seen: 2:02 PM  I have reviewed the triage vital signs and the nursing notes.   HISTORY  Chief Complaint Chest Pain    HPI Kimberly Hood is a 84 y.o. female with a past medical history of hypertension, MI, CVA, presents to the emergency department for left-sided chest pain.  According to the patient since awakening this morning at 6:00 she has been expensing pain in her left chest.  States the pain only occurs if she moves or pushes on the chest.  Denies any cough or trouble breathing.  Denies any nausea or diaphoresis.  No leg pain or swelling.  Largely negative review of systems.   Past Medical History:  Diagnosis Date  . Heart attack ([place])   . Hypertension   . MI (myocardial infarction) ([place]) 2009  . Presence of permanent cardiac pacemaker 02/24/2017  . Stroke [facility])     Patient Active Problem List   Diagnosis Date Noted  . Mobitz type 2 second degree atrioventricular block 02/25/2017    Past Surgical History:  Procedure Laterality Date  . ABDOMINAL HYSTERECTOMY    . APPENDECTOMY    . PACEMAKER IMPLANT N/A 02/25/2017   Procedure: Pacemaker Implant;  Surgeon: Evans Lance, MD;  Location: [place] [facility];  Service: Cardiovascular;  Laterality: N/A;  . TONSILLECTOMY      Prior to Admission medications   Medication Sig Start Date End Date Taking? Authorizing Provider  amiodarone (PACERONE) 100 MG tablet Take 1 tablet (100 mg total) by mouth daily. 03/08/18   Deboraha Sprang, MD  CALCIUM PO Take 1 tablet by mouth daily.    [provider]  Cholecalciferol (VITAMIN D3 PO) Take 1 tablet by mouth daily.    [provider]  Coenzyme Q10-Fish Oil-Vit E (CO-Q 10 OMEGA-3 FISH OIL PO) Take 1 capsule by mouth daily.     [provider]  donepezil (ARICEPT) 5 MG tablet Take 5 mg by mouth at bedtime.    [provider]  furosemide (LASIX) 20 MG tablet Take 10 mg  by mouth daily.     [provider]  lisinopril (PRINIVIL,ZESTRIL) 40 MG tablet Take 40 mg by mouth daily.    [provider]  Multiple Vitamin (MULTIVITAMIN) tablet Take 1 tablet by mouth daily.    [provider]  potassium chloride SA (K-DUR,KLOR-CON) 20 MEQ tablet Take 20 mEq by mouth 2 (two) times daily.    [provider]  simvastatin (ZOCOR) 40 MG tablet Take 40 mg by mouth daily.    [provider]  warfarin (COUMADIN) 4 MG tablet Take 2 mg by mouth daily.     [provider]    No Known Allergies  Family History  Problem Relation Age of Onset  . Diabetes Mother   . Heart attack Mother   . Heart attack Father   . Brain cancer Sister   . Hypertension Sister     Social History Social History   Tobacco Use  . Smoking status: Never Smoker  . Smokeless tobacco: Never Used  Substance Use Topics  . Alcohol use: No  . Drug use: No    Review of Systems Constitutional: Negative for fever. Cardiovascular: Mild left-sided chest pain with palpation Respiratory: Negative for shortness of breath. Gastrointestinal: Negative for abdominal pain, vomiting and diarrhea. Genitourinary: Negative for urinary compaints Musculoskeletal: Negative for musculoskeletal complaints Skin: Negative for skin complaints  Neurological: Negative for headache All other  ROS negative  ____________________________________________   PHYSICAL EXAM:  Constitutional: Alert and oriented. Well appearing and in no distress. Eyes: Normal exam ENT   Head: Normocephalic and atraumatic.   Mouth/Throat: Mucous membranes are moist. Cardiovascular: Normal rate, regular rhythm. No murmurs, rubs, or gallops. Respiratory: Normal respiratory effort without tachypnea nor retractions. Breath sounds are clear.  Patient does have moderate tenderness to palpation in the left chest wall just below her pacemaker. Gastrointestinal: Soft and nontender. No  distention.  Musculoskeletal: Nontender with normal range of motion in all extremities. No lower extremity tenderness or edema. Neurologic:  Normal speech and language. No gross focal neurologic deficits Skin:  Skin is warm, dry and intact.  Psychiatric: Mood and affect are normal.  ____________________________________________    EKG  EKG reviewed and interpreted by myself shows atrial paced rhythm at 60 bpm with a widened QRS nonspecific ST changes  ____________________________________________    RADIOLOGY  Chest x-ray shows no active disease  ____________________________________________   INITIAL IMPRESSION / ASSESSMENT AND PLAN / ED COURSE  Pertinent labs & imaging results that were available during my care of the patient were reviewed by me and considered in my medical decision making (see chart for details).  Patient presents to the emergency department for chest pain in the left chest only when she moves or pushes on the chest.  States she went to bed feeling fine last night but when she woke this morning the pain was present.  No nausea, diaphoresis, leg pain or swelling.  Denies any trouble breathing.  Differential would include chest wall pain/musculoskeletal pain, pneumonia, ACS.  Patient's exam is very consistent with chest wall/musculoskeletal pain very reproducible on examination.  Patient is EKG shows a paced rhythm.  Troponin is negative, which was obtained greater than 6 hours after the onset of the discomfort.  Labs are largely at baseline for the patient.  Given the reproducibility of the chest pain highly suspect chest wall pain/musculoskeletal pain to be the cause of her discomfort.  We will discharge with PCP follow-up.  Patient and daughter agreeable to plan of care.  ____________________________________________   FINAL CLINICAL IMPRESSION(S) / ED DIAGNOSES  Chest wall pain    Harvest Dark, MD 05/23/18 1407

## 2018-05-31 DIAGNOSIS — Z0000 Encounter for general adult medical examination without abnormal findings: Secondary | ICD-10-CM | POA: Diagnosis not present

## 2018-05-31 DIAGNOSIS — I209 Angina pectoris, unspecified: Secondary | ICD-10-CM | POA: Diagnosis not present

## 2018-05-31 DIAGNOSIS — I517 Cardiomegaly: Secondary | ICD-10-CM | POA: Diagnosis not present

## 2018-05-31 DIAGNOSIS — I4891 Unspecified atrial fibrillation: Secondary | ICD-10-CM | POA: Diagnosis not present

## 2018-05-31 DIAGNOSIS — E8881 Metabolic syndrome: Secondary | ICD-10-CM | POA: Diagnosis not present

## 2018-06-07 ENCOUNTER — Ambulatory Visit (INDEPENDENT_AMBULATORY_CARE_PROVIDER_SITE_OTHER): Payer: Medicare Other | Admitting: *Deleted

## 2018-06-07 DIAGNOSIS — I441 Atrioventricular block, second degree: Secondary | ICD-10-CM

## 2018-06-07 DIAGNOSIS — R001 Bradycardia, unspecified: Secondary | ICD-10-CM | POA: Diagnosis not present

## 2018-06-07 NOTE — Progress Notes (Signed)
Remote pacemaker transmission.   

## 2018-06-14 DIAGNOSIS — M19011 Primary osteoarthritis, right shoulder: Secondary | ICD-10-CM | POA: Diagnosis not present

## 2018-06-14 DIAGNOSIS — Z0000 Encounter for general adult medical examination without abnormal findings: Secondary | ICD-10-CM | POA: Diagnosis not present

## 2018-06-14 DIAGNOSIS — E669 Obesity, unspecified: Secondary | ICD-10-CM | POA: Diagnosis not present

## 2018-06-14 DIAGNOSIS — R079 Chest pain, unspecified: Secondary | ICD-10-CM | POA: Diagnosis not present

## 2018-06-14 DIAGNOSIS — I517 Cardiomegaly: Secondary | ICD-10-CM | POA: Diagnosis not present

## 2018-06-14 DIAGNOSIS — I4891 Unspecified atrial fibrillation: Secondary | ICD-10-CM | POA: Diagnosis not present

## 2018-06-28 DIAGNOSIS — E669 Obesity, unspecified: Secondary | ICD-10-CM | POA: Diagnosis not present

## 2018-06-28 DIAGNOSIS — R079 Chest pain, unspecified: Secondary | ICD-10-CM | POA: Diagnosis not present

## 2018-06-28 DIAGNOSIS — I4891 Unspecified atrial fibrillation: Secondary | ICD-10-CM | POA: Diagnosis not present

## 2018-06-28 DIAGNOSIS — I517 Cardiomegaly: Secondary | ICD-10-CM | POA: Diagnosis not present

## 2018-06-28 DIAGNOSIS — Z23 Encounter for immunization: Secondary | ICD-10-CM | POA: Diagnosis not present

## 2018-07-04 LAB — CUP PACEART REMOTE DEVICE CHECK
Battery Remaining Percentage: 95.5 %
Brady Statistic AP VS Percent: 99 %
Brady Statistic AS VS Percent: 1 %
Brady Statistic RV Percent Paced: 1 %
Date Time Interrogation Session: 20190903075135
Implantable Lead Implant Date: 20180524
Implantable Lead Location: 753859
Implantable Lead Location: 753860
Implantable Pulse Generator Implant Date: 20180524
Lead Channel Impedance Value: 550 Ohm
Lead Channel Pacing Threshold Amplitude: 0.75 V
Lead Channel Pacing Threshold Amplitude: 0.75 V
Lead Channel Pacing Threshold Pulse Width: 0.5 ms
Lead Channel Setting Pacing Amplitude: 2 V
Lead Channel Setting Pacing Pulse Width: 0.5 ms
MDC IDC LEAD IMPLANT DT: 20180524
MDC IDC MSMT BATTERY REMAINING LONGEVITY: 118 mo
MDC IDC MSMT BATTERY VOLTAGE: 3.05 V
MDC IDC MSMT LEADCHNL RA PACING THRESHOLD PULSEWIDTH: 0.5 ms
MDC IDC MSMT LEADCHNL RA SENSING INTR AMPL: 3.5 mV
MDC IDC MSMT LEADCHNL RV IMPEDANCE VALUE: 510 Ohm
MDC IDC MSMT LEADCHNL RV SENSING INTR AMPL: 12 mV
MDC IDC PG SERIAL: 8911338
MDC IDC SET LEADCHNL RV PACING AMPLITUDE: 2.5 V
MDC IDC SET LEADCHNL RV SENSING SENSITIVITY: 2 mV
MDC IDC STAT BRADY AP VP PERCENT: 1 %
MDC IDC STAT BRADY AS VP PERCENT: 1 %
MDC IDC STAT BRADY RA PERCENT PACED: 99 %

## 2018-08-23 DIAGNOSIS — H539 Unspecified visual disturbance: Secondary | ICD-10-CM | POA: Diagnosis not present

## 2018-08-23 DIAGNOSIS — I10 Essential (primary) hypertension: Secondary | ICD-10-CM | POA: Diagnosis not present

## 2018-08-23 DIAGNOSIS — I517 Cardiomegaly: Secondary | ICD-10-CM | POA: Diagnosis not present

## 2018-08-23 DIAGNOSIS — K529 Noninfective gastroenteritis and colitis, unspecified: Secondary | ICD-10-CM | POA: Diagnosis not present

## 2018-08-23 IMAGING — CR DG SHOULDER 2+V*R*
1 series · 3 of 3 positions shown · non-contrast
Comparison: 09/15/2013 plain film exam.

CLINICAL DATA: 89-year-old female with elbow and shoulder pain for
3 weeks. No injury. Initial encounter.

EXAM:
RIGHT SHOULDER - 2+ VIEW

[Series 1: dg shoulder right · 0.14mm/px · 3 of 3 slices shown]
[im 1/3]
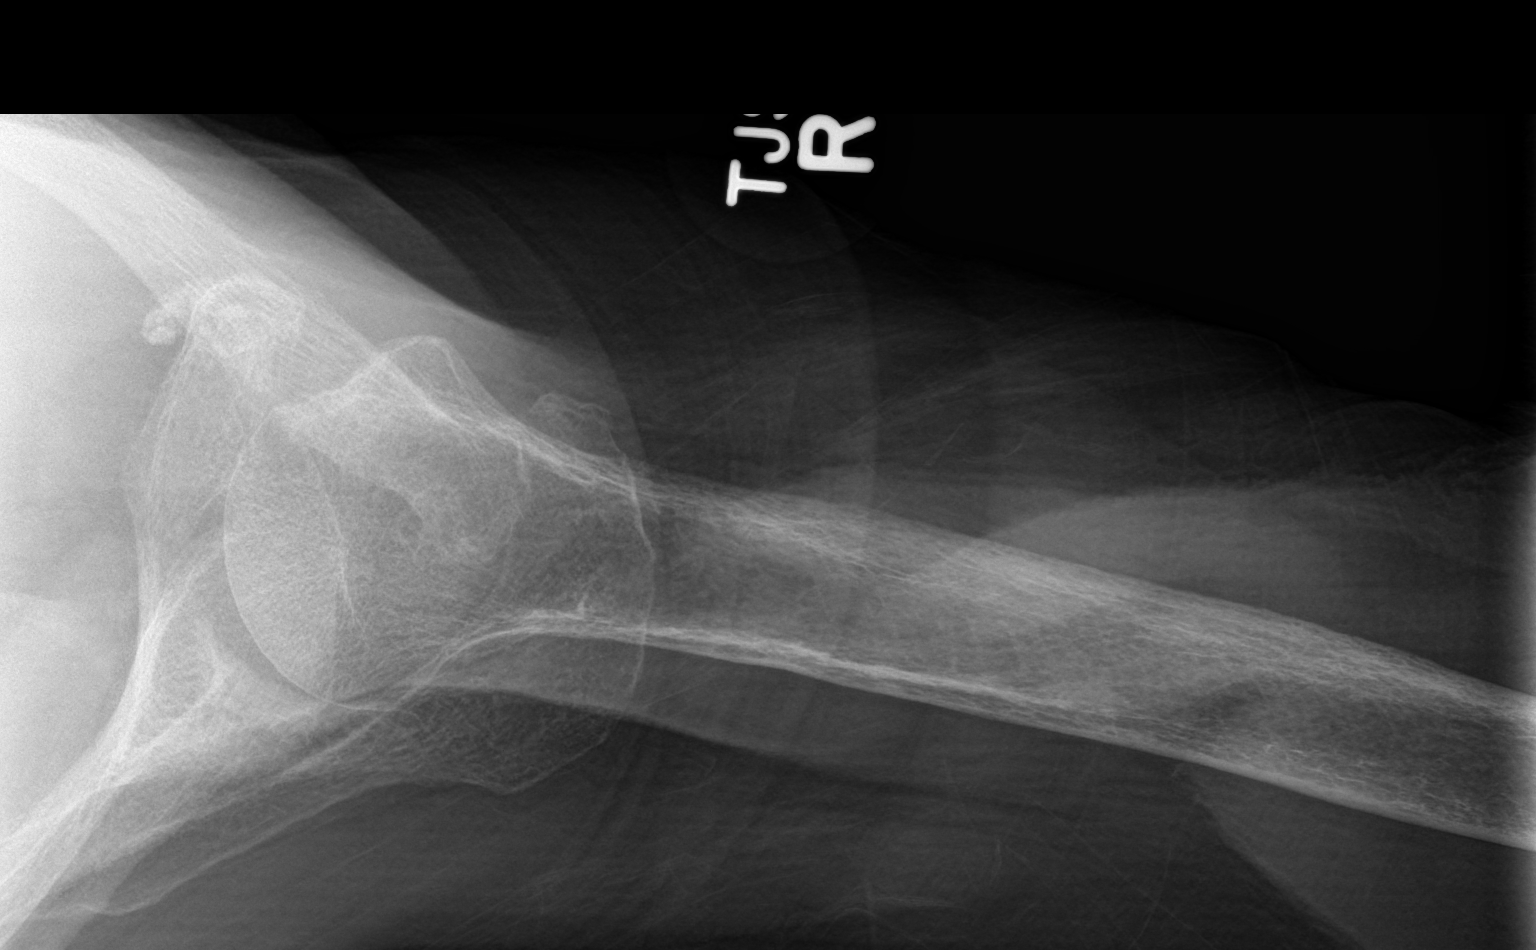
[im 2/3]
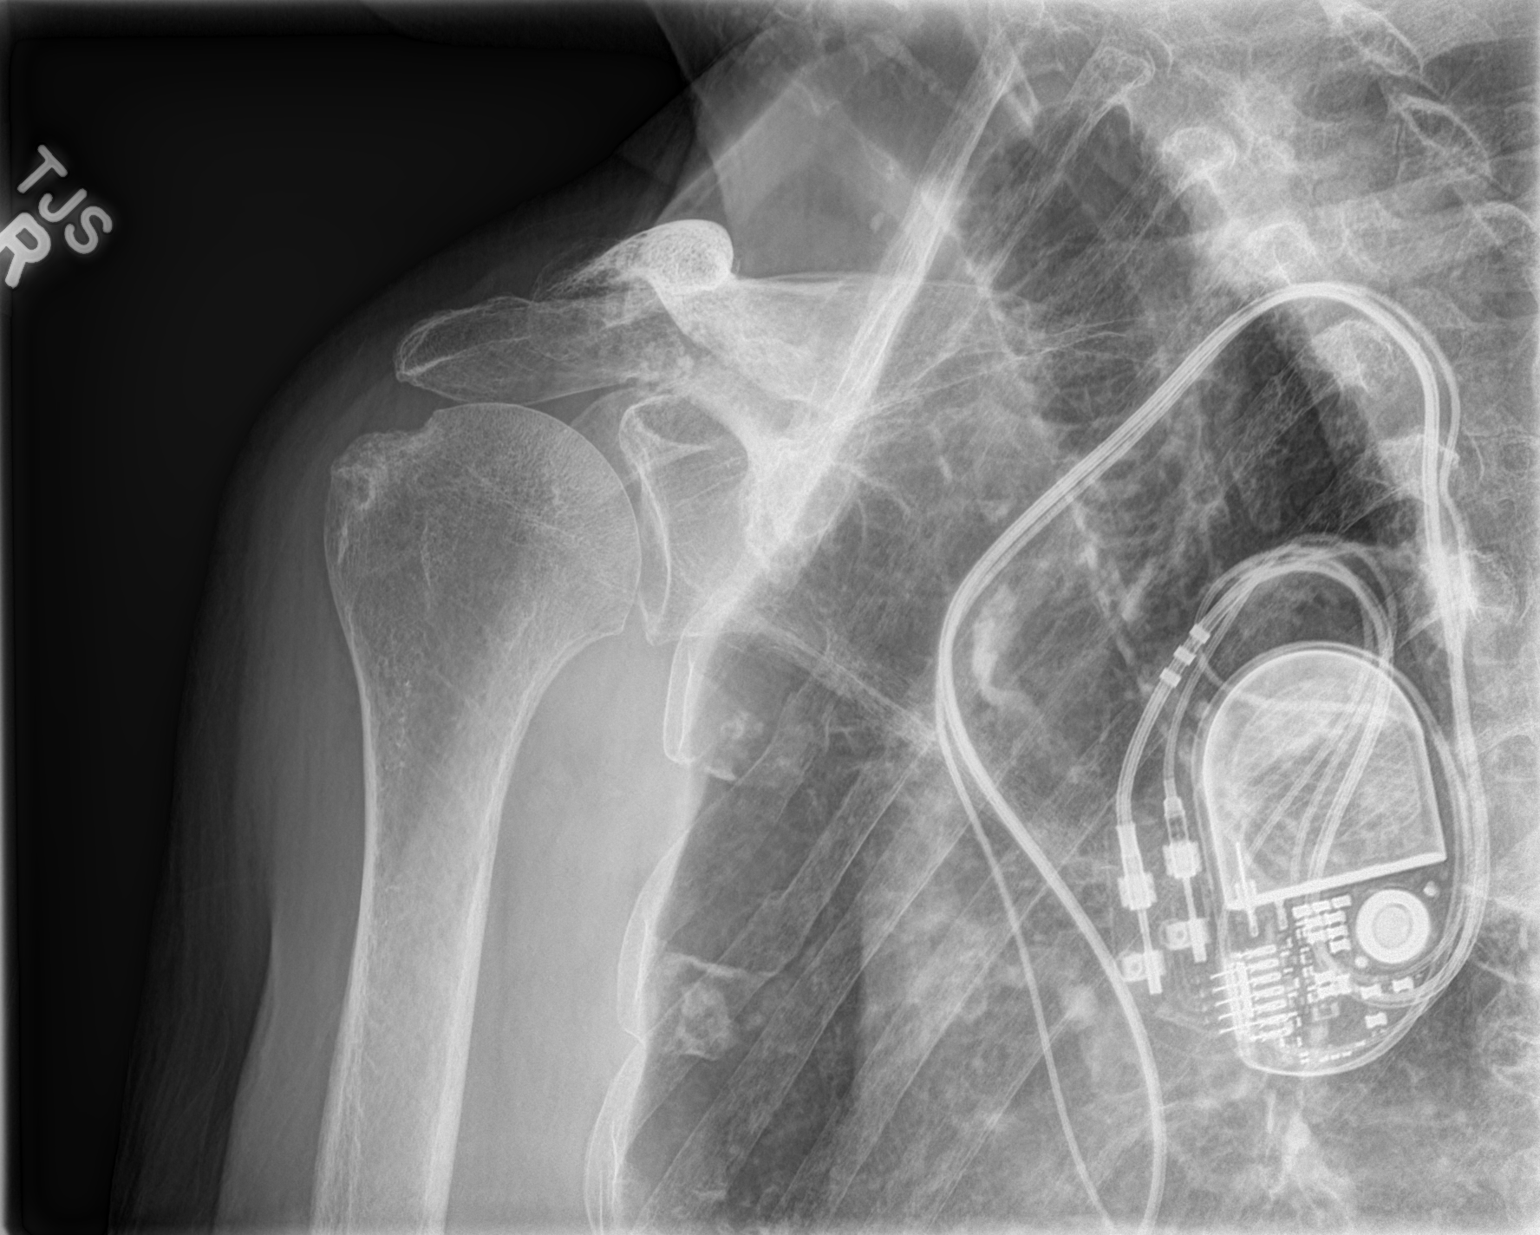
[im 3/3]
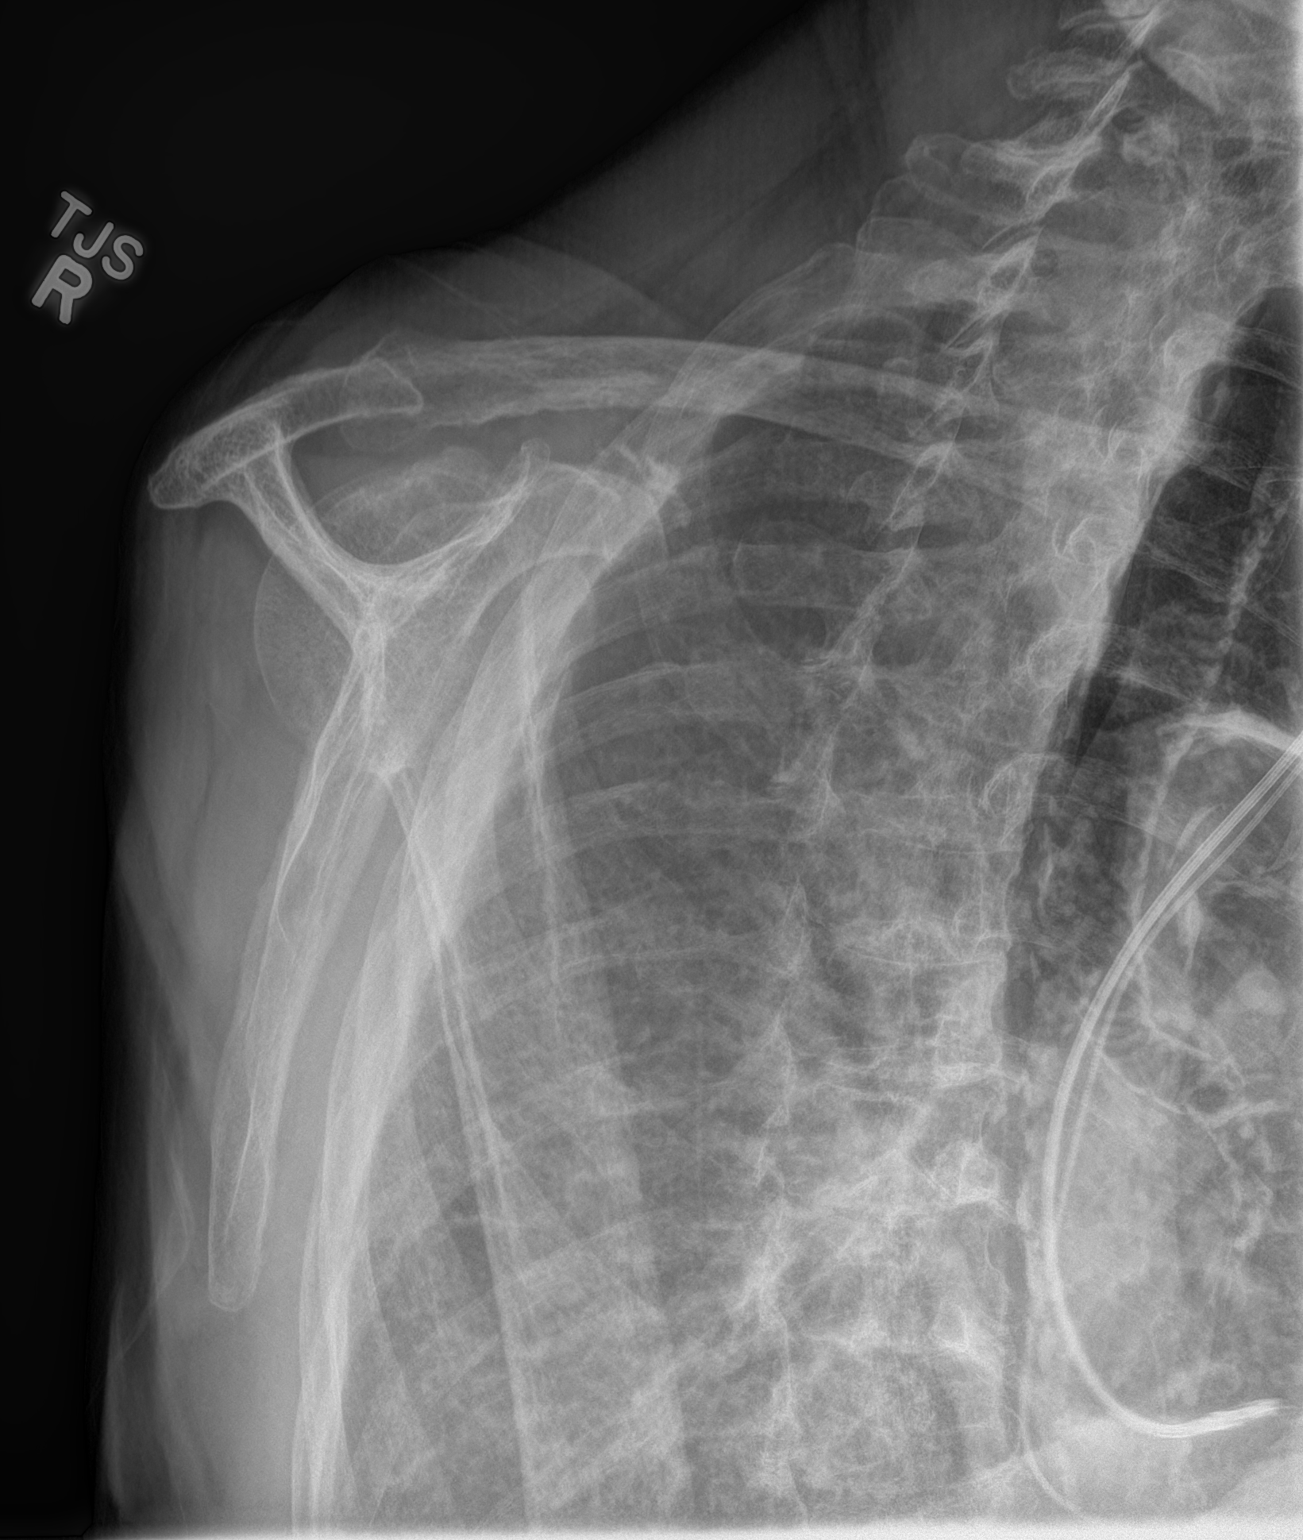

[3 of 3 positions shown; findings below may reference images not displayed]

FINDINGS: No fracture or dislocation.

Mild acromioclavicular joint degenerative changes.

Mild degenerative changes greater tuberosity level.

Calcifications anterior to the coronoid process felt to be within
soft tissue.

Sequential pacemaker in place.
IMPRESSION: Mild acromioclavicular joint degenerative changes.

Mild degenerative changes greater tuberosity level.

Calcifications anterior to the coronoid process felt to be within
soft tissue.

## 2018-08-23 IMAGING — CR DG ELBOW COMPLETE 3+V*R*
1 series · 4 of 4 positions shown · non-contrast
Comparison: None.

CLINICAL DATA: 89-year-old female with elbow and shoulder pain for
3 weeks. No injury. Initial encounter.

EXAM:
RIGHT ELBOW - COMPLETE 3+ VIEW

[Series 1: dg elbow complete right (3+view) · 0.14mm/px · 4 of 4 slices shown]
[im 1/4]
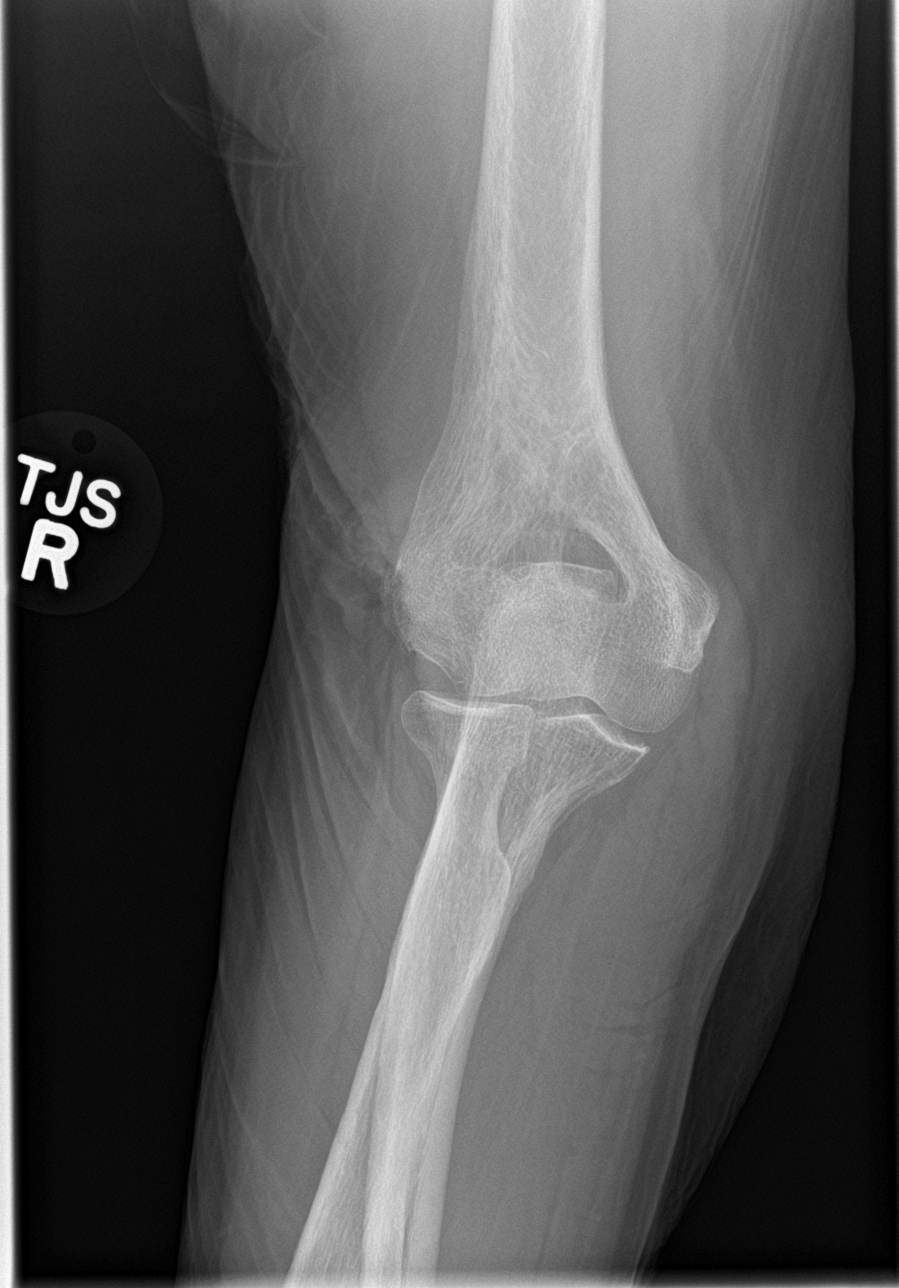
[im 2/4]
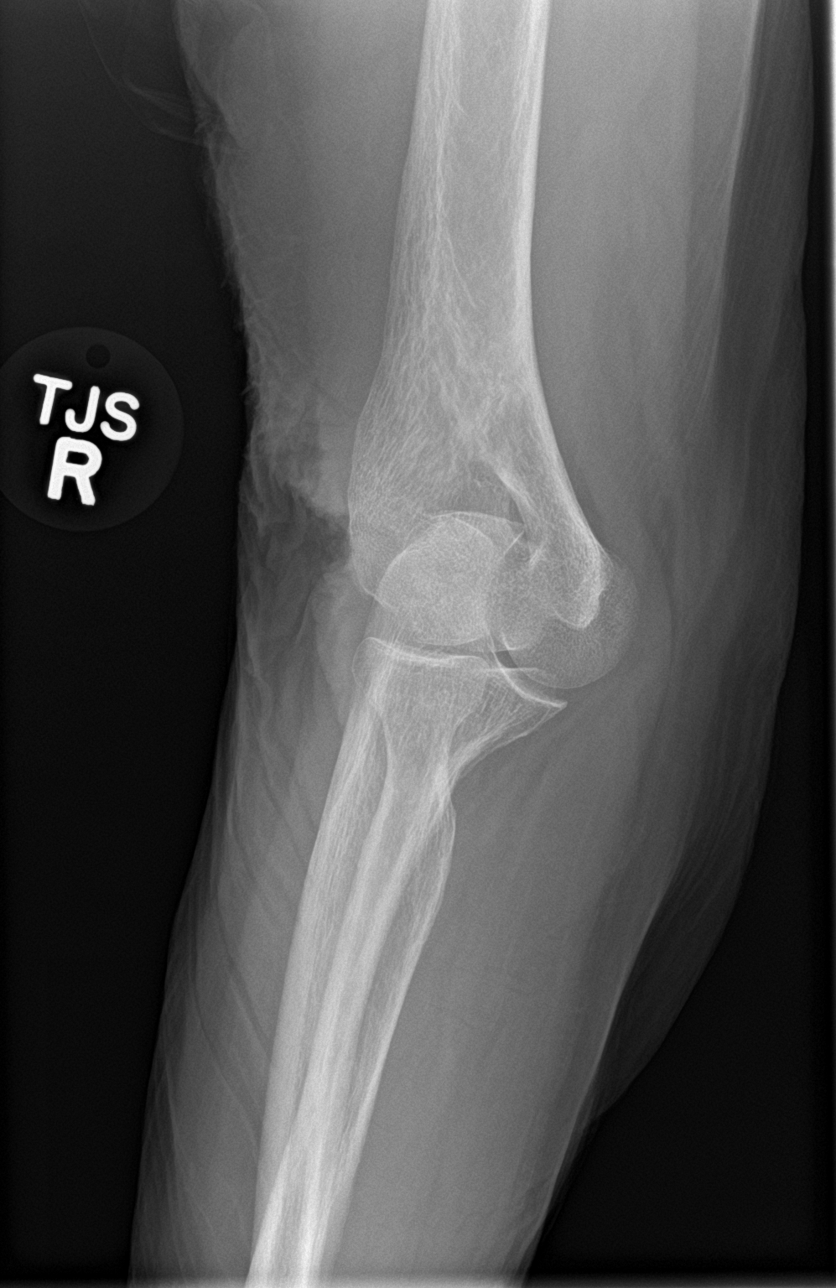
[im 3/4]
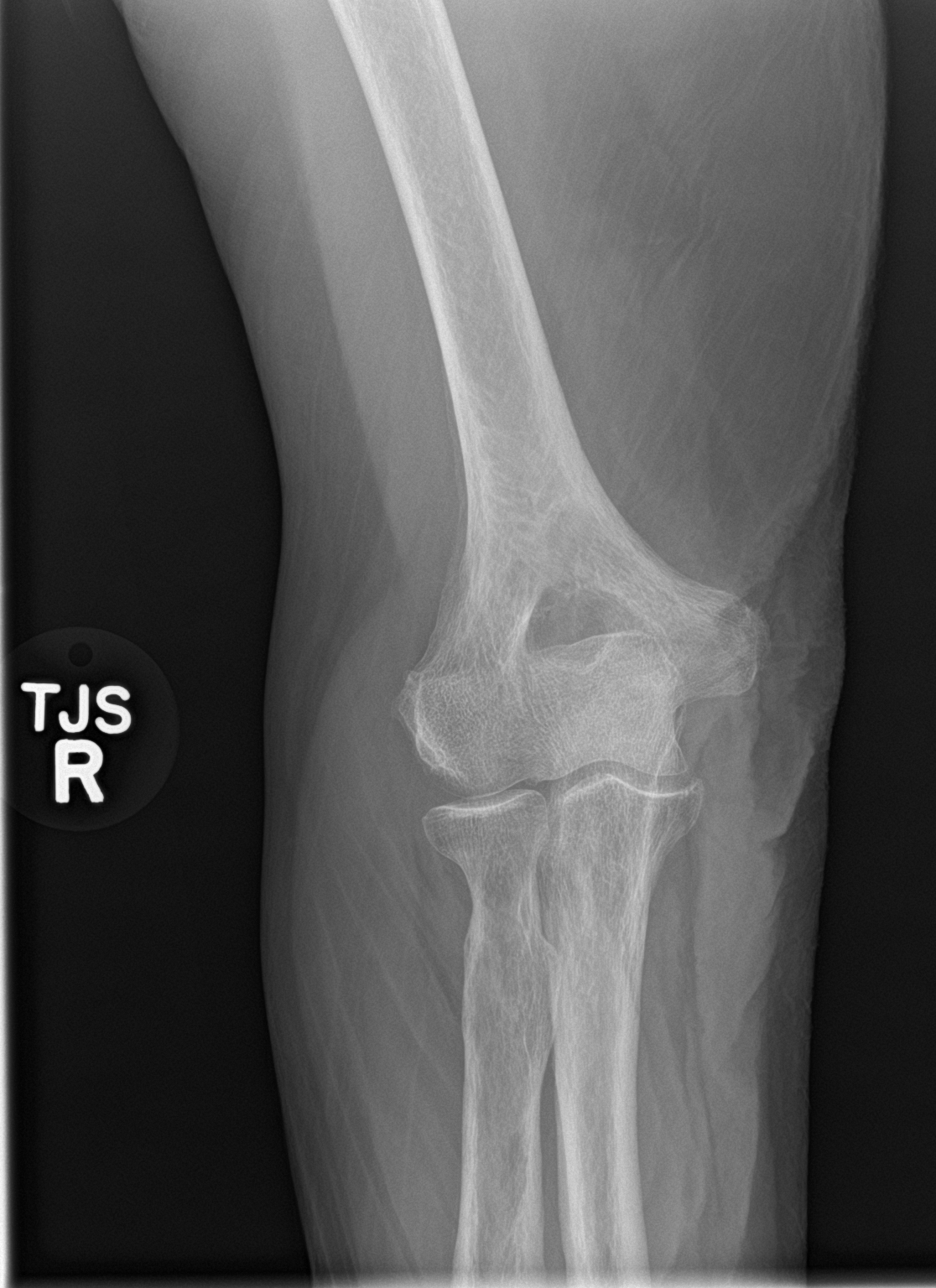
[im 4/4]
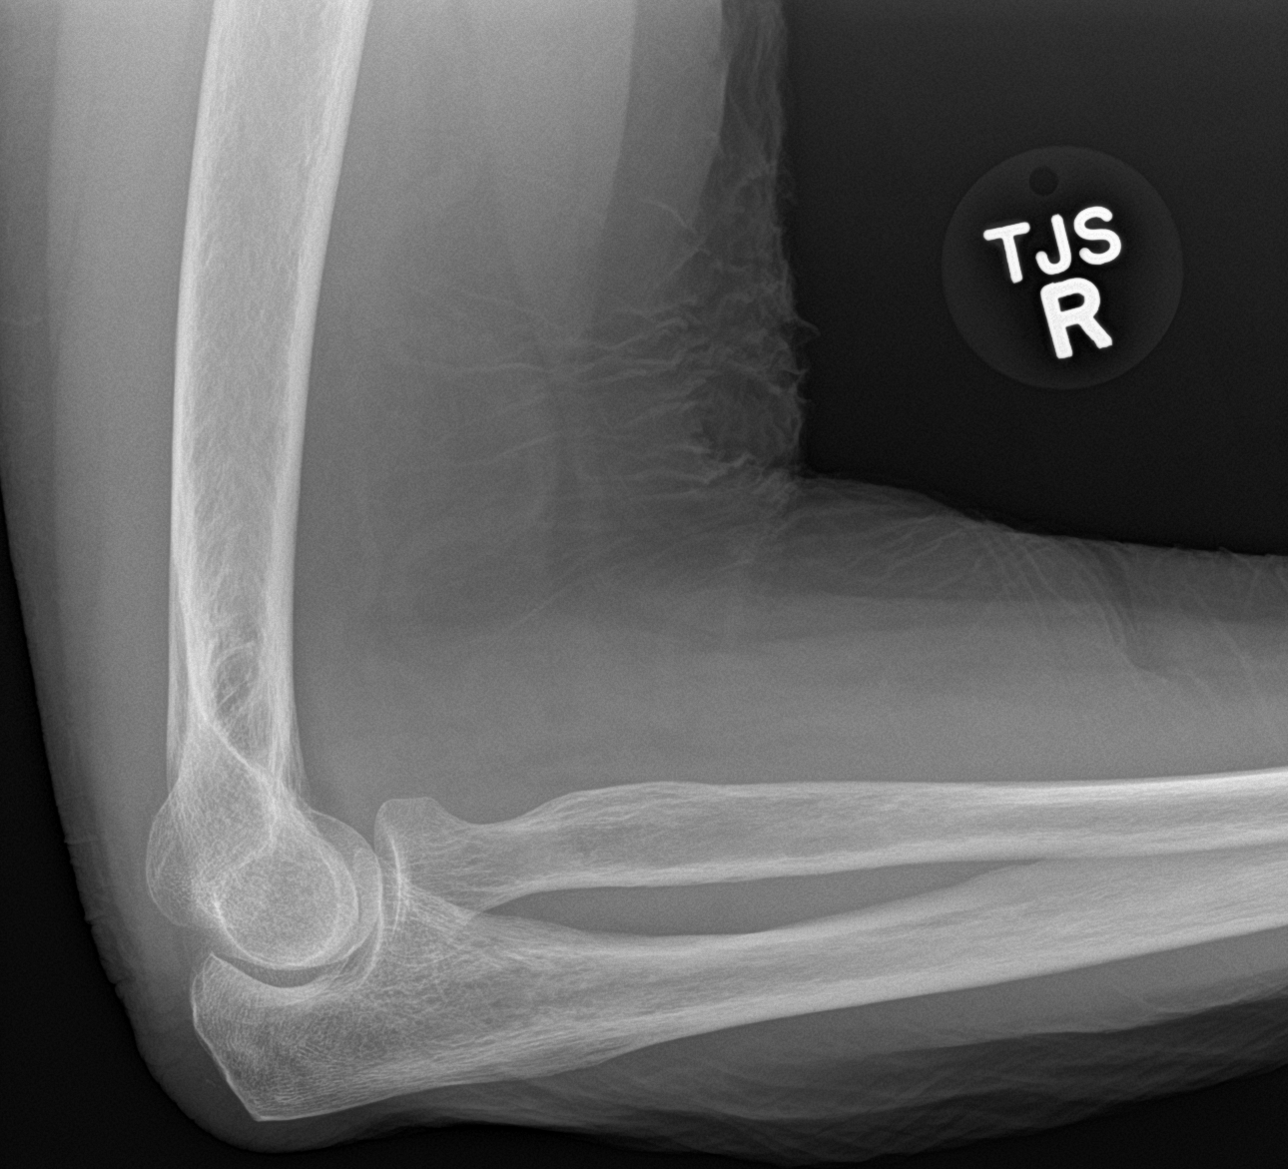

[4 of 4 positions shown; findings below may reference images not displayed]

FINDINGS: No fracture or dislocation.

No significant joint space narrowing.

No secondary findings of joint effusion.

Tiny calcification posterior to the olecranon of indeterminate
significance.
IMPRESSION: Tiny calcification posterior to the olecranon of indeterminate
significance otherwise negative exam.

## 2018-08-25 ENCOUNTER — Other Ambulatory Visit: Payer: Self-pay

## 2018-09-06 ENCOUNTER — Ambulatory Visit (INDEPENDENT_AMBULATORY_CARE_PROVIDER_SITE_OTHER): Payer: Medicare Other

## 2018-09-06 DIAGNOSIS — I441 Atrioventricular block, second degree: Secondary | ICD-10-CM | POA: Diagnosis not present

## 2018-09-06 NOTE — Progress Notes (Signed)
Remote pacemaker transmission.   

## 2018-09-07 DIAGNOSIS — R413 Other amnesia: Secondary | ICD-10-CM | POA: Diagnosis not present

## 2018-09-21 DIAGNOSIS — I69398 Other sequelae of cerebral infarction: Secondary | ICD-10-CM | POA: Diagnosis not present

## 2018-09-21 DIAGNOSIS — M21921 Unspecified acquired deformity of right upper arm: Secondary | ICD-10-CM | POA: Diagnosis not present

## 2018-09-21 DIAGNOSIS — K529 Noninfective gastroenteritis and colitis, unspecified: Secondary | ICD-10-CM | POA: Diagnosis not present

## 2018-09-21 DIAGNOSIS — H539 Unspecified visual disturbance: Secondary | ICD-10-CM | POA: Diagnosis not present

## 2018-10-14 DIAGNOSIS — R5381 Other malaise: Secondary | ICD-10-CM | POA: Diagnosis not present

## 2018-10-14 DIAGNOSIS — I10 Essential (primary) hypertension: Secondary | ICD-10-CM | POA: Diagnosis not present

## 2018-10-26 DIAGNOSIS — K529 Noninfective gastroenteritis and colitis, unspecified: Secondary | ICD-10-CM | POA: Diagnosis not present

## 2018-10-26 DIAGNOSIS — H539 Unspecified visual disturbance: Secondary | ICD-10-CM | POA: Diagnosis not present

## 2018-10-26 DIAGNOSIS — I69398 Other sequelae of cerebral infarction: Secondary | ICD-10-CM | POA: Diagnosis not present

## 2018-10-26 DIAGNOSIS — I517 Cardiomegaly: Secondary | ICD-10-CM | POA: Diagnosis not present

## 2018-10-27 LAB — CUP PACEART REMOTE DEVICE CHECK
Battery Remaining Longevity: 116 mo
Brady Statistic AP VP Percent: 1 %
Brady Statistic AP VS Percent: 99 %
Brady Statistic AS VP Percent: 1 %
Brady Statistic AS VS Percent: 1 %
Date Time Interrogation Session: 20191203103918
Lead Channel Impedance Value: 490 Ohm
Lead Channel Impedance Value: 530 Ohm
Lead Channel Pacing Threshold Pulse Width: 0.5 ms
Lead Channel Sensing Intrinsic Amplitude: 12 mV
Lead Channel Setting Pacing Amplitude: 2 V
Lead Channel Setting Pacing Amplitude: 2.5 V
MDC IDC LEAD IMPLANT DT: 20180524
MDC IDC LEAD IMPLANT DT: 20180524
MDC IDC LEAD LOCATION: 753859
MDC IDC LEAD LOCATION: 753860
MDC IDC MSMT BATTERY REMAINING PERCENTAGE: 95.5 %
MDC IDC MSMT BATTERY VOLTAGE: 3.05 V
MDC IDC MSMT LEADCHNL RA PACING THRESHOLD AMPLITUDE: 0.75 V
MDC IDC MSMT LEADCHNL RA PACING THRESHOLD PULSEWIDTH: 0.5 ms
MDC IDC MSMT LEADCHNL RA SENSING INTR AMPL: 1.3 mV
MDC IDC MSMT LEADCHNL RV PACING THRESHOLD AMPLITUDE: 0.75 V
MDC IDC PG IMPLANT DT: 20180524
MDC IDC PG SERIAL: 8911338
MDC IDC SET LEADCHNL RV PACING PULSEWIDTH: 0.5 ms
MDC IDC SET LEADCHNL RV SENSING SENSITIVITY: 2 mV
MDC IDC STAT BRADY RA PERCENT PACED: 99 %
MDC IDC STAT BRADY RV PERCENT PACED: 1 %

## 2018-11-09 DIAGNOSIS — R413 Other amnesia: Secondary | ICD-10-CM | POA: Diagnosis not present

## 2018-12-06 ENCOUNTER — Ambulatory Visit (INDEPENDENT_AMBULATORY_CARE_PROVIDER_SITE_OTHER): Payer: Medicare Other | Admitting: *Deleted

## 2018-12-06 DIAGNOSIS — R001 Bradycardia, unspecified: Secondary | ICD-10-CM | POA: Diagnosis not present

## 2018-12-06 DIAGNOSIS — I441 Atrioventricular block, second degree: Secondary | ICD-10-CM

## 2018-12-08 LAB — CUP PACEART REMOTE DEVICE CHECK
Battery Remaining Percentage: 95.5 %
Battery Voltage: 3.04 V
Brady Statistic RA Percent Paced: 99 %
Brady Statistic RV Percent Paced: 1 %
Implantable Lead Implant Date: 20180524
Implantable Lead Implant Date: 20180524
Implantable Lead Location: 753859
Implantable Lead Location: 753860
Implantable Lead Model: 3830
Implantable Pulse Generator Implant Date: 20180524
Lead Channel Impedance Value: 460 Ohm
Lead Channel Pacing Threshold Amplitude: 0.75 V
Lead Channel Pacing Threshold Pulse Width: 0.5 ms
Lead Channel Sensing Intrinsic Amplitude: 3.3 mV
Lead Channel Setting Pacing Pulse Width: 0.5 ms
Lead Channel Setting Sensing Sensitivity: 2 mV
MDC IDC MSMT BATTERY REMAINING LONGEVITY: 116 mo
MDC IDC MSMT LEADCHNL RA IMPEDANCE VALUE: 540 Ohm
MDC IDC MSMT LEADCHNL RV PACING THRESHOLD AMPLITUDE: 0.75 V
MDC IDC MSMT LEADCHNL RV PACING THRESHOLD PULSEWIDTH: 0.5 ms
MDC IDC MSMT LEADCHNL RV SENSING INTR AMPL: 12 mV
MDC IDC PG SERIAL: 8911338
MDC IDC SESS DTM: 20200303070020
MDC IDC SET LEADCHNL RA PACING AMPLITUDE: 2 V
MDC IDC SET LEADCHNL RV PACING AMPLITUDE: 2.5 V
MDC IDC STAT BRADY AP VP PERCENT: 1 %
MDC IDC STAT BRADY AP VS PERCENT: 99 %
MDC IDC STAT BRADY AS VP PERCENT: 1 %
MDC IDC STAT BRADY AS VS PERCENT: 1 %

## 2018-12-13 NOTE — Progress Notes (Signed)
Remote pacemaker transmission.   

## 2019-02-27 IMAGING — CR DG CHEST 2V
1 series · 2 of 2 positions shown · non-contrast
Comparison: Radiographs February 26, 2017.

CLINICAL DATA: Chest pain.

EXAM:
CHEST - 2 VIEW

[Series 1: x chest ap · 0.14mm/px · 2 of 2 slices shown]
[im 1/2]
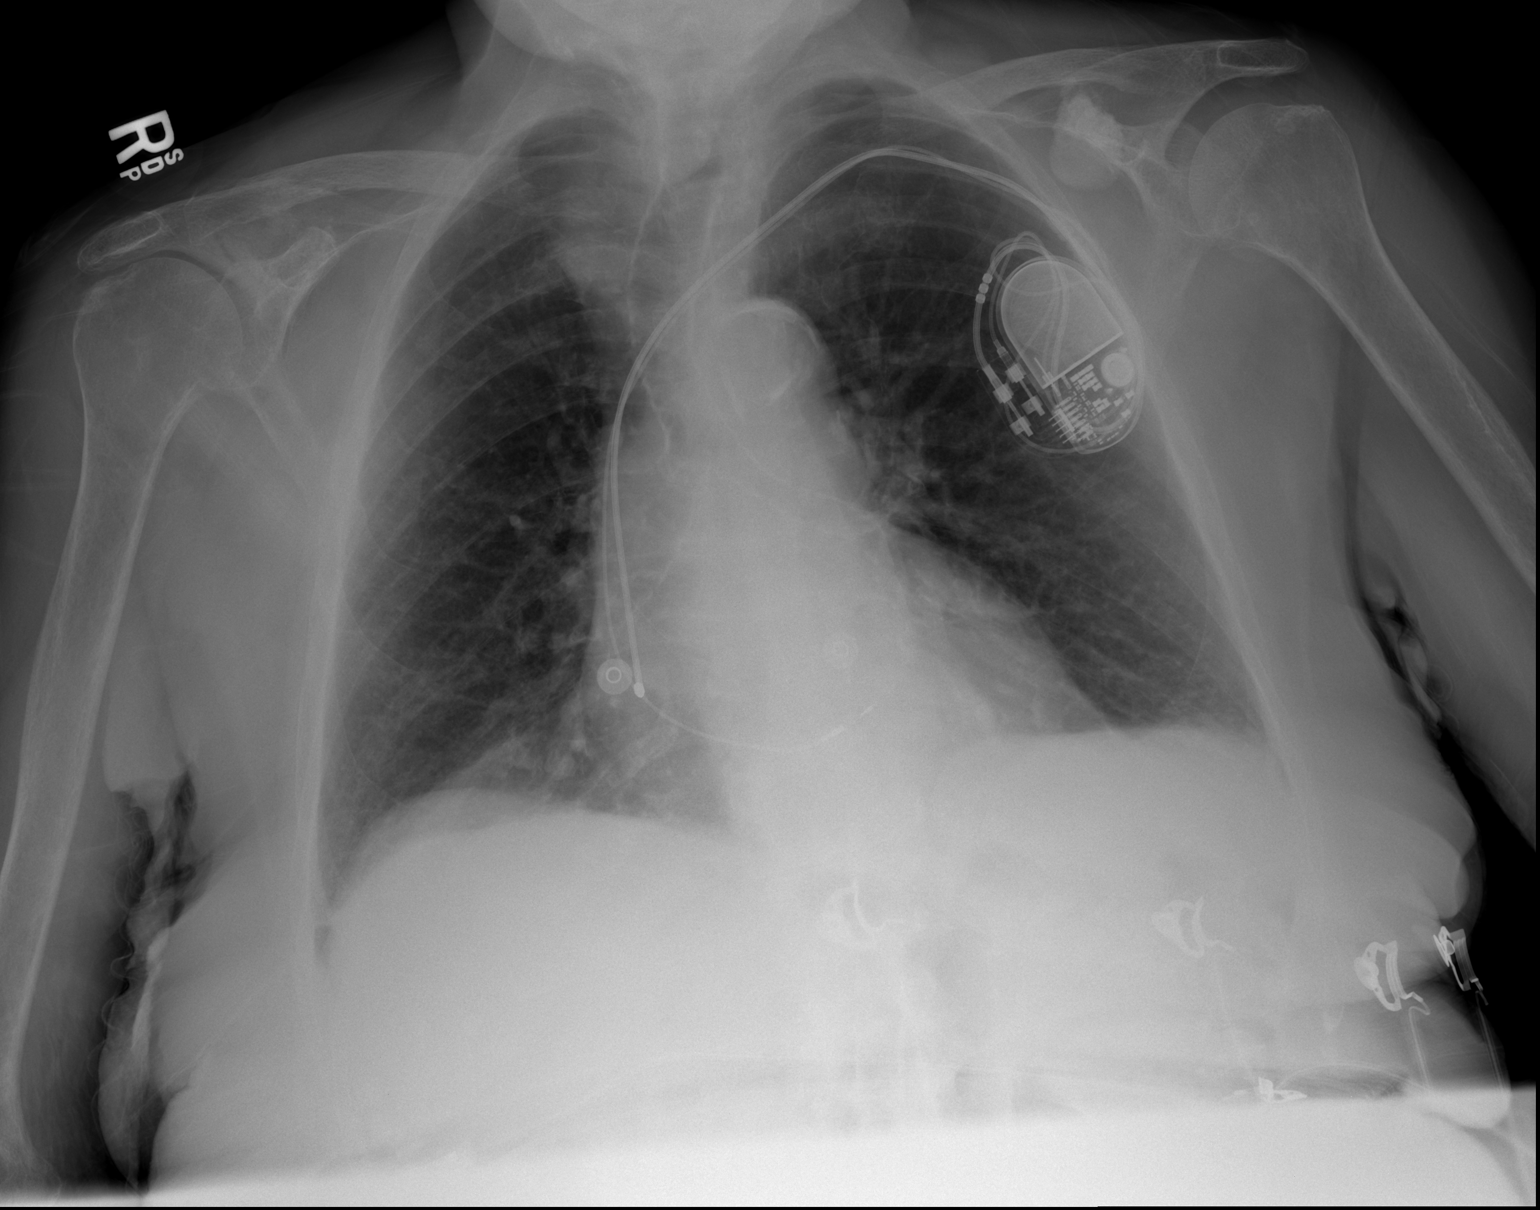
[im 2/2]
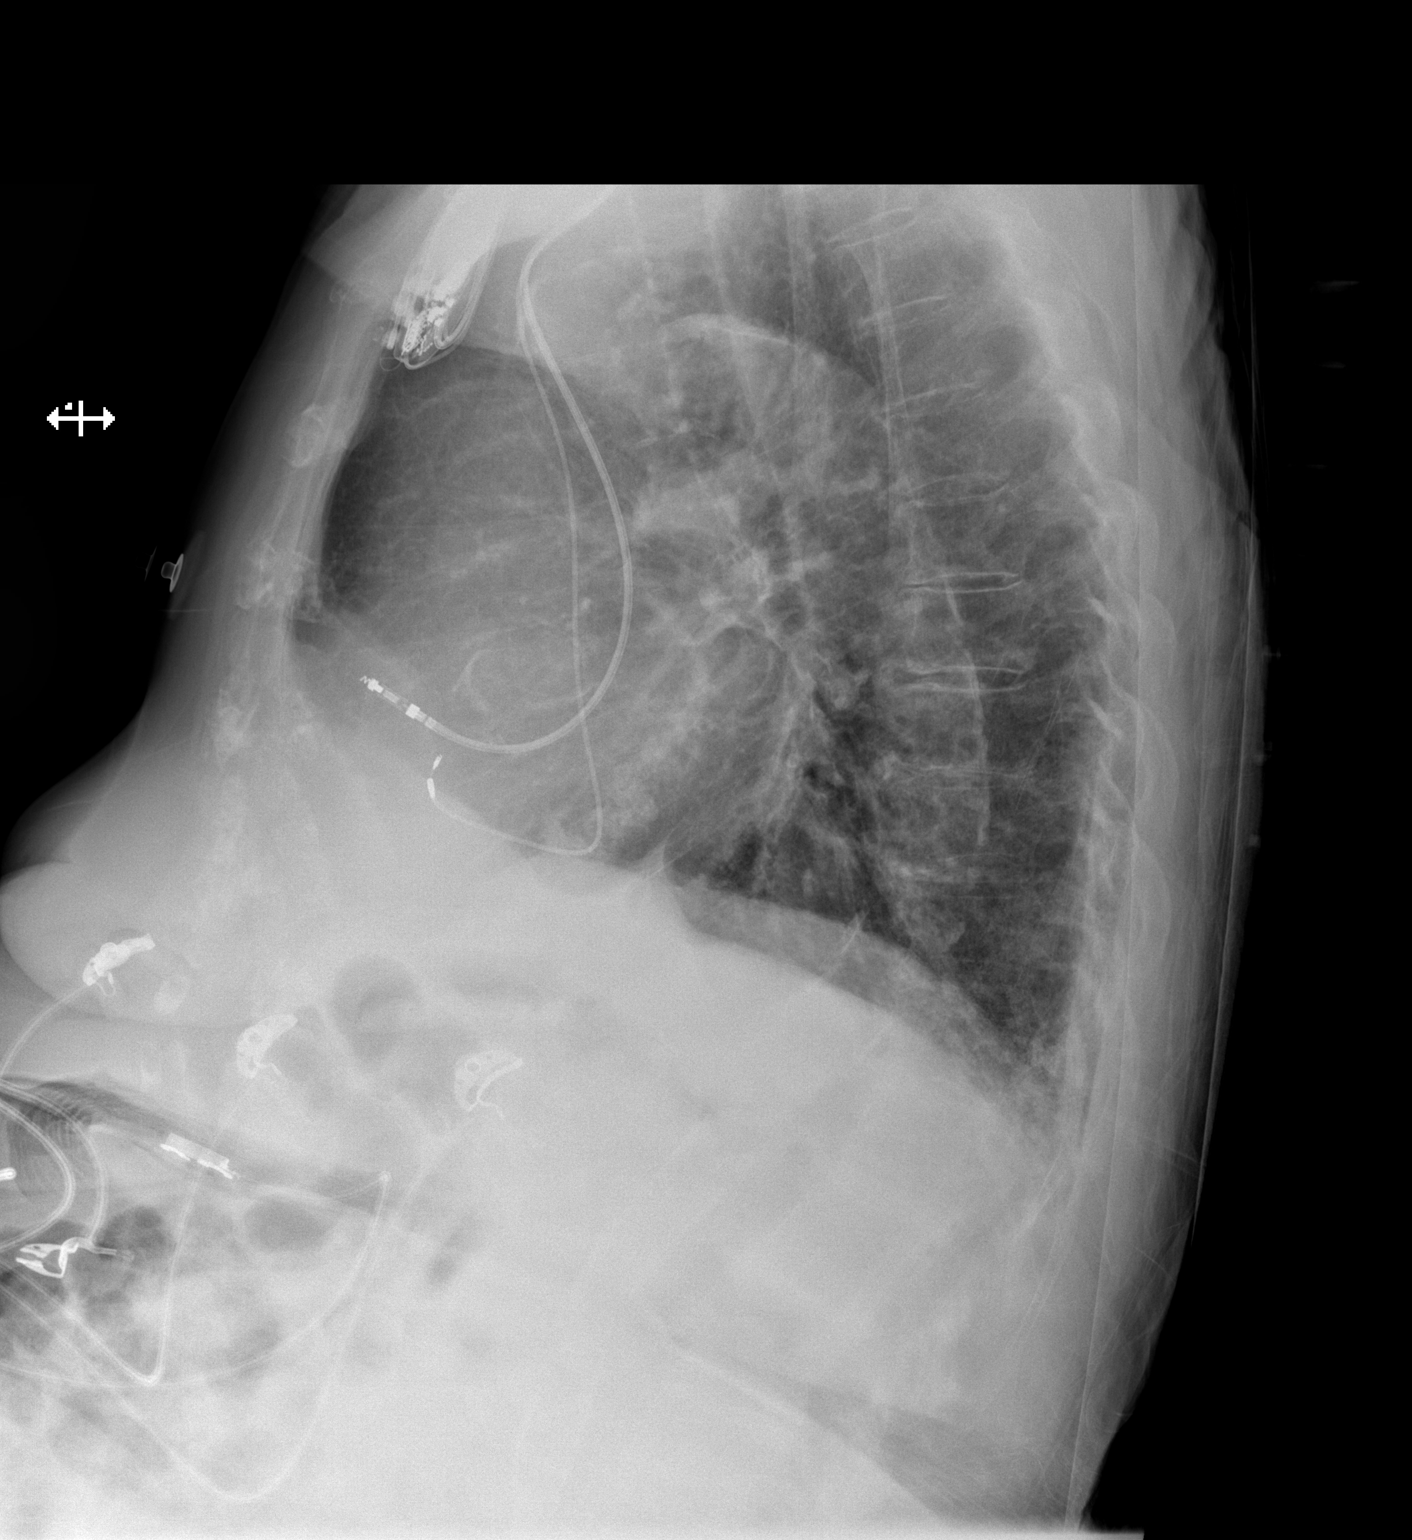

[2 of 2 positions shown; findings below may reference images not displayed]

FINDINGS: The heart size and mediastinal contours are within normal limits.
Both lungs are clear. No pneumothorax or pleural effusion is noted.
Atherosclerosis of thoracic aorta is noted. Left-sided pacemaker is
unchanged in position. The visualized skeletal structures are
unremarkable.
IMPRESSION: No active cardiopulmonary disease.

Aortic Atherosclerosis ([id number]-[id number].X).

## 2019-03-07 ENCOUNTER — Ambulatory Visit (INDEPENDENT_AMBULATORY_CARE_PROVIDER_SITE_OTHER): Payer: Medicare Other | Admitting: *Deleted

## 2019-03-07 DIAGNOSIS — R001 Bradycardia, unspecified: Secondary | ICD-10-CM | POA: Diagnosis not present

## 2019-03-07 LAB — CUP PACEART REMOTE DEVICE CHECK
Battery Remaining Longevity: 118 mo
Battery Remaining Percentage: 95.5 %
Battery Voltage: 3.04 V
Brady Statistic AP VP Percent: 1 %
Brady Statistic AP VS Percent: 99 %
Brady Statistic AS VP Percent: 1 %
Brady Statistic AS VS Percent: 1 %
Brady Statistic RA Percent Paced: 99 %
Brady Statistic RV Percent Paced: 1 %
Date Time Interrogation Session: 20200602060014
Implantable Lead Implant Date: 20180524
Implantable Lead Implant Date: 20180524
Implantable Lead Location: 753859
Implantable Lead Location: 753860
Implantable Lead Model: 3830
Implantable Pulse Generator Implant Date: 20180524
Lead Channel Impedance Value: 490 Ohm
Lead Channel Impedance Value: 540 Ohm
Lead Channel Pacing Threshold Amplitude: 0.75 V
Lead Channel Pacing Threshold Amplitude: 0.75 V
Lead Channel Pacing Threshold Pulse Width: 0.5 ms
Lead Channel Pacing Threshold Pulse Width: 0.5 ms
Lead Channel Sensing Intrinsic Amplitude: 12 mV
Lead Channel Sensing Intrinsic Amplitude: 2.4 mV
Lead Channel Setting Pacing Amplitude: 2 V
Lead Channel Setting Pacing Amplitude: 2.5 V
Lead Channel Setting Pacing Pulse Width: 0.5 ms
Lead Channel Setting Sensing Sensitivity: 2 mV
Pulse Gen Model: 2272
Pulse Gen Serial Number: 8911338

## 2019-03-14 ENCOUNTER — Encounter: Payer: Self-pay | Admitting: Cardiology

## 2019-03-14 NOTE — Progress Notes (Signed)
Remote pacemaker transmission.   

## 2019-06-06 ENCOUNTER — Ambulatory Visit (INDEPENDENT_AMBULATORY_CARE_PROVIDER_SITE_OTHER): Payer: Medicare Other | Admitting: *Deleted

## 2019-06-06 DIAGNOSIS — R001 Bradycardia, unspecified: Secondary | ICD-10-CM

## 2019-06-06 DIAGNOSIS — I441 Atrioventricular block, second degree: Secondary | ICD-10-CM | POA: Diagnosis not present

## 2019-06-06 LAB — CUP PACEART REMOTE DEVICE CHECK
Battery Remaining Longevity: 119 mo
Battery Remaining Percentage: 95.5 %
Battery Voltage: 3.04 V
Brady Statistic AP VP Percent: 1 %
Brady Statistic AP VS Percent: 99 %
Brady Statistic AS VP Percent: 1 %
Brady Statistic AS VS Percent: 1 %
Brady Statistic RA Percent Paced: 99 %
Brady Statistic RV Percent Paced: 1 %
Date Time Interrogation Session: 20200901064837
Implantable Lead Implant Date: 20180524
Implantable Lead Implant Date: 20180524
Implantable Lead Location: 753859
Implantable Lead Location: 753860
Implantable Lead Model: 3830
Implantable Pulse Generator Implant Date: 20180524
Lead Channel Impedance Value: 530 Ohm
Lead Channel Impedance Value: 590 Ohm
Lead Channel Pacing Threshold Amplitude: 0.75 V
Lead Channel Pacing Threshold Amplitude: 0.75 V
Lead Channel Pacing Threshold Pulse Width: 0.5 ms
Lead Channel Pacing Threshold Pulse Width: 0.5 ms
Lead Channel Sensing Intrinsic Amplitude: 12 mV
Lead Channel Sensing Intrinsic Amplitude: 2 mV
Lead Channel Setting Pacing Amplitude: 2 V
Lead Channel Setting Pacing Amplitude: 2.5 V
Lead Channel Setting Pacing Pulse Width: 0.5 ms
Lead Channel Setting Sensing Sensitivity: 2 mV
Pulse Gen Model: 2272
Pulse Gen Serial Number: 8911338

## 2019-06-19 NOTE — Progress Notes (Signed)
Remote pacemaker transmission.   

## 2019-07-20 DIAGNOSIS — Z23 Encounter for immunization: Secondary | ICD-10-CM | POA: Diagnosis not present

## 2019-08-04 DIAGNOSIS — E669 Obesity, unspecified: Secondary | ICD-10-CM | POA: Diagnosis not present

## 2019-08-04 DIAGNOSIS — E8881 Metabolic syndrome: Secondary | ICD-10-CM | POA: Diagnosis not present

## 2019-08-04 DIAGNOSIS — I209 Angina pectoris, unspecified: Secondary | ICD-10-CM | POA: Diagnosis not present

## 2019-08-04 DIAGNOSIS — I10 Essential (primary) hypertension: Secondary | ICD-10-CM | POA: Diagnosis not present

## 2019-08-04 DIAGNOSIS — Z20828 Contact with and (suspected) exposure to other viral communicable diseases: Secondary | ICD-10-CM | POA: Diagnosis not present

## 2019-08-17 DIAGNOSIS — K529 Noninfective gastroenteritis and colitis, unspecified: Secondary | ICD-10-CM | POA: Diagnosis not present

## 2019-08-17 DIAGNOSIS — H539 Unspecified visual disturbance: Secondary | ICD-10-CM | POA: Diagnosis not present

## 2019-08-17 DIAGNOSIS — I517 Cardiomegaly: Secondary | ICD-10-CM | POA: Diagnosis not present

## 2019-08-17 DIAGNOSIS — I10 Essential (primary) hypertension: Secondary | ICD-10-CM | POA: Diagnosis not present

## 2019-09-05 ENCOUNTER — Ambulatory Visit (INDEPENDENT_AMBULATORY_CARE_PROVIDER_SITE_OTHER): Payer: Medicare Other | Admitting: *Deleted

## 2019-09-05 DIAGNOSIS — I441 Atrioventricular block, second degree: Secondary | ICD-10-CM | POA: Diagnosis not present

## 2019-09-05 LAB — CUP PACEART REMOTE DEVICE CHECK
Battery Remaining Longevity: 118 mo
Battery Remaining Percentage: 95.5 %
Battery Voltage: 3.04 V
Brady Statistic AP VP Percent: 1 %
Brady Statistic AP VS Percent: 99 %
Brady Statistic AS VP Percent: 1 %
Brady Statistic AS VS Percent: 1 %
Brady Statistic RA Percent Paced: 99 %
Brady Statistic RV Percent Paced: 1 %
Date Time Interrogation Session: 20201201023147
Implantable Lead Implant Date: 20180524
Implantable Lead Implant Date: 20180524
Implantable Lead Location: 753859
Implantable Lead Location: 753860
Implantable Lead Model: 3830
Implantable Pulse Generator Implant Date: 20180524
Lead Channel Impedance Value: 530 Ohm
Lead Channel Impedance Value: 560 Ohm
Lead Channel Pacing Threshold Amplitude: 0.75 V
Lead Channel Pacing Threshold Amplitude: 0.75 V
Lead Channel Pacing Threshold Pulse Width: 0.5 ms
Lead Channel Pacing Threshold Pulse Width: 0.5 ms
Lead Channel Sensing Intrinsic Amplitude: 12 mV
Lead Channel Sensing Intrinsic Amplitude: 2.4 mV
Lead Channel Setting Pacing Amplitude: 2 V
Lead Channel Setting Pacing Amplitude: 2.5 V
Lead Channel Setting Pacing Pulse Width: 0.5 ms
Lead Channel Setting Sensing Sensitivity: 2 mV
Pulse Gen Model: 2272
Pulse Gen Serial Number: 8911338

## 2019-09-30 NOTE — Progress Notes (Signed)
PPM remote 

## 2019-11-28 DIAGNOSIS — I059 Rheumatic mitral valve disease, unspecified: Secondary | ICD-10-CM | POA: Diagnosis not present

## 2019-11-28 DIAGNOSIS — I517 Cardiomegaly: Secondary | ICD-10-CM | POA: Diagnosis not present

## 2019-11-28 DIAGNOSIS — I69398 Other sequelae of cerebral infarction: Secondary | ICD-10-CM | POA: Diagnosis not present

## 2019-11-28 DIAGNOSIS — H539 Unspecified visual disturbance: Secondary | ICD-10-CM | POA: Diagnosis not present

## 2019-11-29 DIAGNOSIS — I10 Essential (primary) hypertension: Secondary | ICD-10-CM | POA: Diagnosis not present

## 2019-11-29 DIAGNOSIS — R5381 Other malaise: Secondary | ICD-10-CM | POA: Diagnosis not present

## 2019-11-29 DIAGNOSIS — E034 Atrophy of thyroid (acquired): Secondary | ICD-10-CM | POA: Diagnosis not present

## 2019-12-05 ENCOUNTER — Ambulatory Visit (INDEPENDENT_AMBULATORY_CARE_PROVIDER_SITE_OTHER): Payer: Medicare Other | Admitting: *Deleted

## 2019-12-05 DIAGNOSIS — I441 Atrioventricular block, second degree: Secondary | ICD-10-CM

## 2019-12-05 LAB — CUP PACEART REMOTE DEVICE CHECK
Battery Remaining Longevity: 118 mo
Battery Remaining Percentage: 95.5 %
Battery Voltage: 3.04 V
Brady Statistic AP VP Percent: 1 %
Brady Statistic AP VS Percent: 99 %
Brady Statistic AS VP Percent: 1 %
Brady Statistic AS VS Percent: 1 %
Brady Statistic RA Percent Paced: 99 %
Brady Statistic RV Percent Paced: 1 %
Date Time Interrogation Session: 20210302031157
Implantable Lead Implant Date: 20180524
Implantable Lead Implant Date: 20180524
Implantable Lead Location: 753859
Implantable Lead Location: 753860
Implantable Lead Model: 3830
Implantable Pulse Generator Implant Date: 20180524
Lead Channel Impedance Value: 490 Ohm
Lead Channel Impedance Value: 540 Ohm
Lead Channel Pacing Threshold Amplitude: 0.75 V
Lead Channel Pacing Threshold Amplitude: 0.75 V
Lead Channel Pacing Threshold Pulse Width: 0.5 ms
Lead Channel Pacing Threshold Pulse Width: 0.5 ms
Lead Channel Sensing Intrinsic Amplitude: 12 mV
Lead Channel Sensing Intrinsic Amplitude: 2.4 mV
Lead Channel Setting Pacing Amplitude: 2 V
Lead Channel Setting Pacing Amplitude: 2.5 V
Lead Channel Setting Pacing Pulse Width: 0.5 ms
Lead Channel Setting Sensing Sensitivity: 2 mV
Pulse Gen Model: 2272
Pulse Gen Serial Number: 8911338

## 2019-12-05 NOTE — Progress Notes (Signed)
PPM Remote  

## 2020-02-21 DIAGNOSIS — D2262 Melanocytic nevi of left upper limb, including shoulder: Secondary | ICD-10-CM | POA: Diagnosis not present

## 2020-02-21 DIAGNOSIS — D485 Neoplasm of uncertain behavior of skin: Secondary | ICD-10-CM | POA: Diagnosis not present

## 2020-02-21 DIAGNOSIS — L570 Actinic keratosis: Secondary | ICD-10-CM | POA: Diagnosis not present

## 2020-02-21 DIAGNOSIS — D2272 Melanocytic nevi of left lower limb, including hip: Secondary | ICD-10-CM | POA: Diagnosis not present

## 2020-02-21 DIAGNOSIS — D2261 Melanocytic nevi of right upper limb, including shoulder: Secondary | ICD-10-CM | POA: Diagnosis not present

## 2020-02-21 DIAGNOSIS — D225 Melanocytic nevi of trunk: Secondary | ICD-10-CM | POA: Diagnosis not present

## 2020-02-21 DIAGNOSIS — Z85828 Personal history of other malignant neoplasm of skin: Secondary | ICD-10-CM | POA: Diagnosis not present

## 2020-03-05 ENCOUNTER — Ambulatory Visit (INDEPENDENT_AMBULATORY_CARE_PROVIDER_SITE_OTHER): Payer: Medicare Other | Admitting: *Deleted

## 2020-03-05 DIAGNOSIS — I441 Atrioventricular block, second degree: Secondary | ICD-10-CM | POA: Diagnosis not present

## 2020-03-05 LAB — CUP PACEART REMOTE DEVICE CHECK
Battery Remaining Longevity: 118 mo
Battery Remaining Percentage: 95.5 %
Battery Voltage: 3.04 V
Brady Statistic AP VP Percent: 1 %
Brady Statistic AP VS Percent: 99 %
Brady Statistic AS VP Percent: 1 %
Brady Statistic AS VS Percent: 1 %
Brady Statistic RA Percent Paced: 99 %
Brady Statistic RV Percent Paced: 1 %
Date Time Interrogation Session: 20210601020023
Implantable Lead Implant Date: 20180524
Implantable Lead Implant Date: 20180524
Implantable Lead Location: 753859
Implantable Lead Location: 753860
Implantable Lead Model: 3830
Implantable Pulse Generator Implant Date: 20180524
Lead Channel Impedance Value: 510 Ohm
Lead Channel Impedance Value: 540 Ohm
Lead Channel Pacing Threshold Amplitude: 0.75 V
Lead Channel Pacing Threshold Amplitude: 0.75 V
Lead Channel Pacing Threshold Pulse Width: 0.5 ms
Lead Channel Pacing Threshold Pulse Width: 0.5 ms
Lead Channel Sensing Intrinsic Amplitude: 1.2 mV
Lead Channel Sensing Intrinsic Amplitude: 12 mV
Lead Channel Setting Pacing Amplitude: 2 V
Lead Channel Setting Pacing Amplitude: 2.5 V
Lead Channel Setting Pacing Pulse Width: 0.5 ms
Lead Channel Setting Sensing Sensitivity: 2 mV
Pulse Gen Model: 2272
Pulse Gen Serial Number: 8911338

## 2020-03-05 NOTE — Progress Notes (Signed)
Remote pacemaker transmission.   

## 2020-03-12 DIAGNOSIS — M19011 Primary osteoarthritis, right shoulder: Secondary | ICD-10-CM | POA: Diagnosis not present

## 2020-03-12 DIAGNOSIS — M25819 Other specified joint disorders, unspecified shoulder: Secondary | ICD-10-CM | POA: Diagnosis not present

## 2020-03-12 DIAGNOSIS — M7541 Impingement syndrome of right shoulder: Secondary | ICD-10-CM | POA: Diagnosis not present

## 2020-03-15 ENCOUNTER — Other Ambulatory Visit: Payer: Self-pay

## 2020-03-15 ENCOUNTER — Ambulatory Visit (INDEPENDENT_AMBULATORY_CARE_PROVIDER_SITE_OTHER): Payer: Medicare Other | Admitting: Internal Medicine

## 2020-03-15 ENCOUNTER — Encounter: Payer: Self-pay | Admitting: Internal Medicine

## 2020-03-15 VITALS — BP 140/70 | HR 80 | Ht <= 58 in | Wt 106.5 lb

## 2020-03-15 DIAGNOSIS — R413 Other amnesia: Secondary | ICD-10-CM

## 2020-03-15 DIAGNOSIS — E039 Hypothyroidism, unspecified: Secondary | ICD-10-CM | POA: Diagnosis not present

## 2020-03-15 DIAGNOSIS — J301 Allergic rhinitis due to pollen: Secondary | ICD-10-CM | POA: Diagnosis not present

## 2020-03-15 DIAGNOSIS — M12811 Other specific arthropathies, not elsewhere classified, right shoulder: Secondary | ICD-10-CM | POA: Diagnosis not present

## 2020-03-15 DIAGNOSIS — I480 Paroxysmal atrial fibrillation: Secondary | ICD-10-CM

## 2020-03-15 NOTE — Assessment & Plan Note (Signed)
It is a chronic problem we will continue Eliquis

## 2020-03-15 NOTE — Addendum Note (Signed)
Addended by: Lacretia Nicks L on: 03/15/2020 11:51 AM   Modules accepted: Orders

## 2020-03-15 NOTE — Assessment & Plan Note (Signed)
Recent memory is intact.

## 2020-03-15 NOTE — Progress Notes (Signed)
Established Patient Office Visit  SUBJECTIVE:  Patient ID: Kimberly Hood, female    DOB: [birth year]  Age: 86 y.o. MRN: [id number]  CC:  Chief Complaint  Patient presents with  . Nasal Congestion    patient complains of keeping a runny nose and would like something to help stop it     HPI [name] presents for nasal congestion and rhinorrhea.  Overall, she is fine. She lives in a nursing home facility. She did note some diarrhea for a few days that has since resolved. She does also complain of frequent bowel gas. She does have some issues with memory recollection.   She has been having some difficulty with her right shoulder, mainly pain when she is using it. She went to [facility] on 03/12/2020 and got a Cortizone shot, but she is still having pain.   Past Medical History:  Diagnosis Date  . Heart attack ([place])   . Hypertension   . MI (myocardial infarction) ([place]) 2009  . Presence of permanent cardiac pacemaker 02/24/2017  . Stroke [facility])     Past Surgical History:  Procedure Laterality Date  . ABDOMINAL HYSTERECTOMY    . APPENDECTOMY    . PACEMAKER IMPLANT N/A 02/25/2017   Procedure: Pacemaker Implant;  Surgeon: Evans Lance, MD;  Location: [place] [facility];  Service: Cardiovascular;  Laterality: N/A;  . TONSILLECTOMY      Family History  Problem Relation Age of Onset  . Diabetes Mother   . Heart attack Mother   . Heart attack Father   . Brain cancer Sister   . Hypertension Sister     Social History   Socioeconomic History  . Marital status: Widowed    Spouse name: Not on file  . Number of children: Not on file  . Years of education: Not on file  . Highest education level: Not on file  Occupational History  . Not on file  Tobacco Use  . Smoking status: Never Smoker  . Smokeless tobacco: Never Used  Vaping Use  . Vaping Use: Never used  Substance and Sexual Activity  . Alcohol use: No  . Drug use: No  . Sexual activity: Not  on file  Other Topics Concern  . Not on file  Social History Narrative  . Not on file   Social Determinants of Health   Financial Resource Strain:   . Difficulty of Paying Living Expenses:   Food Insecurity:   . Worried About Charity fundraiser in the Last Year:   . Arboriculturist in the Last Year:   Transportation Needs:   . Film/video editor (Medical):   Marland Kitchen Lack of Transportation (Non-Medical):   Physical Activity:   . Days of Exercise per Week:   . Minutes of Exercise per Session:   Stress:   . Feeling of Stress :   Social Connections:   . Frequency of Communication with Friends and Family:   . Frequency of Social Gatherings with Friends and Family:   . Attends Religious Services:   . Active Member of Clubs or Organizations:   . Attends Archivist Meetings:   Marland Kitchen Marital Status:   Intimate Partner Violence:   . Fear of Current or Ex-Partner:   . Emotionally Abused:   Marland Kitchen Physically Abused:   . Sexually Abused:      Current Outpatient Medications:  .  amiodarone (PACERONE) 100 MG tablet, Take 1 tablet (100 mg total) by mouth daily., Disp:  90 tablet, Rfl: 3 .  apixaban (ELIQUIS) 5 MG TABS tablet, Take 5 mg by mouth 2 (two) times daily., Disp: , Rfl:  .  ezetimibe (ZETIA) 10 MG tablet, Take 10 mg by mouth daily., Disp: , Rfl:  .  levothyroxine (SYNTHROID) 25 MCG tablet, Take 25 mcg by mouth daily before breakfast., Disp: , Rfl:  .  lisinopril (PRINIVIL,ZESTRIL) 40 MG tablet, Take 40 mg by mouth daily., Disp: , Rfl:  .  potassium chloride SA (K-DUR,KLOR-CON) 20 MEQ tablet, Take 20 mEq by mouth 2 (two) times daily., Disp: , Rfl:  .  simvastatin (ZOCOR) 40 MG tablet, Take 40 mg by mouth daily., Disp: , Rfl:    No Known Allergies  ROS Review of Systems  Constitutional: Negative.   HENT: Positive for rhinorrhea.   Eyes: Negative.   Respiratory: Negative.   Cardiovascular: Negative.   Gastrointestinal: Positive for diarrhea (4 days, now resolved).   Endocrine: Negative.   Genitourinary: Negative.   Musculoskeletal: Positive for arthralgias (right shoulder).  Skin: Negative.   Allergic/Immunologic: Negative.   Neurological: Negative.   Hematological: Negative.   Psychiatric/Behavioral: Negative.   All other systems reviewed and are negative.     OBJECTIVE:    Physical Exam Vitals reviewed.  Constitutional:      Appearance: Normal appearance.  Cardiovascular:     Rate and Rhythm: Normal rate and regular rhythm.     Pulses: Normal pulses.     Heart sounds: Murmur (slight) heard.   Pulmonary:     Effort: Pulmonary effort is normal.     Breath sounds: Normal breath sounds.  Abdominal:     Palpations: Abdomen is soft.     Tenderness: There is no abdominal tenderness.  Musculoskeletal:     Right lower leg: No edema.     Left lower leg: No edema.     Comments: Proximal muscle weakness in hips  Skin:    Coloration: Skin is pale.  Neurological:     Mental Status: She is alert and oriented to person, place, and time.     Comments: Mask-like face Bradykinesia  Psychiatric:        Mood and Affect: Mood normal.        Behavior: Behavior normal.     BP 140/70   Pulse 80   Ht 4\' 8"  (1.422 m)   Wt 106 lb 8 oz (48.3 kg)   BMI 23.88 kg/m  Wt Readings from Last 3 Encounters:  03/15/20 106 lb 8 oz (48.3 kg)  05/23/18 114 lb 6.7 oz (51.9 kg)  03/08/18 114 lb 8 oz (51.9 kg)    Health Maintenance Due  Topic Date Due  . COVID-19 Vaccine (1) Never done  . TETANUS/TDAP  Never done  . DEXA SCAN  Never done  . PNA vac Low Risk Adult (1 of 2 - PCV13) Never done    There are no preventive care reminders to display for this patient.  CBC Latest Ref Rng & Units 05/23/2018 03/09/2018 02/17/2017  WBC 3.6 - 11.0 K/uL 9.3 7.8 9.9  Hemoglobin 12.0 - 16.0 g/dL 13.6 12.5 13.9  Hematocrit 35 - 47 % 39.9 36.7 41.8  Platelets 150 - 440 K/uL 197 207 237   CMP Latest Ref Rng & Units 05/23/2018 03/09/2018 02/17/2017  Glucose 70 - 99 mg/dL  96 181(H) 80  BUN 8 - 23 mg/dL 32(H) 24(H) 25  Creatinine 0.44 - 1.00 mg/dL 1.27(H) 1.44(H) 1.19(H)  Sodium 135 - 145 mmol/L 139 138 141  Potassium  3.5 - 5.1 mmol/L 4.7 4.4 4.6  Chloride 98 - 111 mmol/L 103 105 101  CO2 22 - 32 mmol/L 28 24 19   Calcium 8.9 - 10.3 mg/dL 8.9 8.8(L) 9.1  Total Protein 6.5 - 8.1 g/dL 6.4(L) 6.2(L) -  Total Bilirubin 0.3 - 1.2 mg/dL 0.7 0.6 -  Alkaline Phos 38 - 126 U/L 77 64 -  AST 15 - 41 U/L 31 34 -  ALT 0 - 44 U/L 36 34 -    Lab Results  Component Value Date   TSH 2.692 03/09/2018   Lab Results  Component Value Date   ALBUMIN 3.7 05/23/2018   ANIONGAP 8 05/23/2018   No results found for: CHOL, HDL, LDLCALC, CHOLHDL No results found for: TRIG Lab Results  Component Value Date   HGBA1C 6.0 08/24/2012      ASSESSMENT & PLAN:   Problem List Items Addressed This Visit      Cardiovascular and Mediastinum   PAF (paroxysmal atrial fibrillation) (HCC)    It is a chronic problem we will continue Eliquis      Relevant Medications   apixaban (ELIQUIS) 5 MG TABS tablet   ezetimibe (ZETIA) 10 MG tablet     Respiratory   Seasonal allergic rhinitis due to pollen     Endocrine   Acquired hypothyroidism    We will check a thyroid profile at the present time she is taking Synthroid 25 mcg p.o. daily  TSH is 0.1 so we will repeat it again and T3 or T4      Relevant Medications   levothyroxine (SYNTHROID) 25 MCG tablet     Musculoskeletal and Integument   Rotator cuff arthropathy of right shoulder     Other   Memory change - Primary    Recent memory is intact.         No orders of the defined types were placed in this encounter.  1. PAF (paroxysmal atrial fibrillation) (HCC) Atrial fibrillation is stable at the present time  2. Memory change Patient memory is not deteriorating.  3. Acquired hypothyroidism Check TSH T3 and T4  4. Seasonal allergic rhinitis due to pollen Take claritin  5. Rotator cuff arthropathy of right  shoulder Received  steroid shot with orthopedic recently Follow-up: Return in about 2 months (around 05/15/2020).    Dr. Jane Canary [facility] [address] [place], [place] [address]   By signing my name below, I, [employer], attest that this documentation has been prepared under the direction and in the presence of Cletis Athens, MD. Electronically Signed: Cletis Athens, MD 03/15/20, 11:11 AM   I personally performed the services described in this documentation, which was SCRIBED in my presence. The recorded information has been reviewed and considered accurate. It has been edited as necessary during review. Cletis Athens, MD

## 2020-03-15 NOTE — Assessment & Plan Note (Signed)
We will check a thyroid profile at the present time she is taking Synthroid 25 mcg p.o. daily  TSH is 0.1 so we will repeat it again and T3 or T4

## 2020-03-16 LAB — CBC WITH DIFFERENTIAL/PLATELET
Absolute Monocytes: 1450 cells/uL — ABNORMAL HIGH (ref 200–950)
Basophils Absolute: 13 cells/uL (ref 0–200)
Basophils Relative: 0.1 %
Eosinophils Absolute: 13 cells/uL — ABNORMAL LOW (ref 15–500)
Eosinophils Relative: 0.1 %
HCT: 42 % (ref 35.0–45.0)
Hemoglobin: 13.8 g/dL (ref 11.7–15.5)
Lymphs Abs: 1197 cells/uL (ref 850–3900)
MCH: 32.9 pg (ref 27.0–33.0)
MCHC: 32.9 g/dL (ref 32.0–36.0)
MCV: 100 fL (ref 80.0–100.0)
MPV: 10.7 fL (ref 7.5–12.5)
Monocytes Relative: 10.9 %
Neutro Abs: 10627 cells/uL — ABNORMAL HIGH (ref 1500–7800)
Neutrophils Relative %: 79.9 %
Platelets: 175 10*3/uL (ref 140–400)
RBC: 4.2 10*6/uL (ref 3.80–5.10)
RDW: 13 % (ref 11.0–15.0)
Total Lymphocyte: 9 %
WBC: 13.3 10*3/uL — ABNORMAL HIGH (ref 3.8–10.8)

## 2020-03-16 LAB — T3: T3, Total: 49 ng/dL — ABNORMAL LOW (ref 76–181)

## 2020-03-16 LAB — TSH: TSH: 1.71 mIU/L (ref 0.40–4.50)

## 2020-03-16 LAB — T4: T4, Total: 7.3 ug/dL (ref 5.1–11.9)

## 2020-03-18 DIAGNOSIS — C44219 Basal cell carcinoma of skin of left ear and external auricular canal: Secondary | ICD-10-CM | POA: Diagnosis not present

## 2020-03-18 DIAGNOSIS — D485 Neoplasm of uncertain behavior of skin: Secondary | ICD-10-CM | POA: Diagnosis not present

## 2020-03-28 ENCOUNTER — Other Ambulatory Visit: Payer: Self-pay | Admitting: Internal Medicine

## 2020-04-23 DIAGNOSIS — C44219 Basal cell carcinoma of skin of left ear and external auricular canal: Secondary | ICD-10-CM | POA: Diagnosis not present

## 2020-05-08 ENCOUNTER — Other Ambulatory Visit: Payer: Self-pay

## 2020-05-08 ENCOUNTER — Ambulatory Visit (INDEPENDENT_AMBULATORY_CARE_PROVIDER_SITE_OTHER): Payer: Medicare Other | Admitting: Internal Medicine

## 2020-05-08 ENCOUNTER — Encounter: Payer: Self-pay | Admitting: Internal Medicine

## 2020-05-08 VITALS — BP 152/61 | HR 61 | Ht 59.0 in | Wt 108.1 lb

## 2020-05-08 DIAGNOSIS — M12811 Other specific arthropathies, not elsewhere classified, right shoulder: Secondary | ICD-10-CM

## 2020-05-08 DIAGNOSIS — I480 Paroxysmal atrial fibrillation: Secondary | ICD-10-CM

## 2020-05-08 DIAGNOSIS — R413 Other amnesia: Secondary | ICD-10-CM | POA: Diagnosis not present

## 2020-05-08 DIAGNOSIS — E039 Hypothyroidism, unspecified: Secondary | ICD-10-CM | POA: Diagnosis not present

## 2020-05-08 DIAGNOSIS — J301 Allergic rhinitis due to pollen: Secondary | ICD-10-CM

## 2020-05-08 NOTE — Assessment & Plan Note (Signed)
The patient was advised to take Claritin 5 mg twice daily.

## 2020-05-08 NOTE — Assessment & Plan Note (Signed)
Stable

## 2020-05-08 NOTE — Progress Notes (Signed)
Established Patient Office Visit  SUBJECTIVE:  Subjective  Patient ID: Kimberly Hood, female    DOB: Nov 03, 1925  Age: 86 y.o. MRN: [id number]  CC:  Chief Complaint  Patient presents with  . memory change    2 month follow up from memory change     HPI Kimberly Hood is a 86 y.o. female presenting today for two-month follow-up. She still lives at a nursing home facility. Today, she reports right shoulder pain and rhinorrhea associated with seasonal allergies. She denies chest pain, fever, difficulty swallowing, and shortness of breath.  Per the patient's daughter, she had a patch of basal cell excised from her left ear, which is healing well. She is taking all medications as prescribed.  Past Medical History:  Diagnosis Date  . Heart attack ([place])   . Hypertension   . MI (myocardial infarction) ([place]) 2009  . Presence of permanent cardiac pacemaker 02/24/2017  . Stroke [facility])     Past Surgical History:  Procedure Laterality Date  . ABDOMINAL HYSTERECTOMY    . APPENDECTOMY    . PACEMAKER IMPLANT N/A 02/25/2017   Procedure: Pacemaker Implant;  Surgeon: Evans Lance, MD;  Location: [facility];  Service: Cardiovascular;  Laterality: N/A;  . TONSILLECTOMY      Family History  Problem Relation Age of Onset  . Diabetes Mother   . Heart attack Mother   . Heart attack Father   . Brain cancer Sister   . Hypertension Sister     Social History   Socioeconomic History  . Marital status: Widowed    Spouse name: Not on file  . Number of children: Not on file  . Years of education: Not on file  . Highest education level: Not on file  Occupational History  . Not on file  Tobacco Use  . Smoking status: Never Smoker  . Smokeless tobacco: Never Used  Vaping Use  . Vaping Use: Never used  Substance and Sexual Activity  . Alcohol use: No  . Drug use: No  . Sexual activity: Not on file  Other Topics Concern  . Not on file  Social History Narrative    . Not on file   Social Determinants of Health   Financial Resource Strain:   . Difficulty of Paying Living Expenses:   Food Insecurity:   . Worried About Charity fundraiser in the Last Year:   . Arboriculturist in the Last Year:   Transportation Needs:   . Film/video editor (Medical):   Marland Kitchen Lack of Transportation (Non-Medical):   Physical Activity:   . Days of Exercise per Week:   . Minutes of Exercise per Session:   Stress:   . Feeling of Stress :   Social Connections:   . Frequency of Communication with Friends and Family:   . Frequency of Social Gatherings with Friends and Family:   . Attends Religious Services:   . Active Member of Clubs or Organizations:   . Attends Archivist Meetings:   Marland Kitchen Marital Status:   Intimate Partner Violence:   . Fear of Current or Ex-Partner:   . Emotionally Abused:   Marland Kitchen Physically Abused:   . Sexually Abused:      Current Outpatient Medications:  .  amiodarone (PACERONE) 100 MG tablet, Take 1 tablet (100 mg total) by mouth daily., Disp: 90 tablet, Rfl: 3 .  ELIQUIS 5 MG TABS tablet, TAKE ONE TABLET TWICE DAILY, Disp: 60 tablet, Rfl:  6 .  ezetimibe (ZETIA) 10 MG tablet, Take 10 mg by mouth daily., Disp: , Rfl:  .  levothyroxine (SYNTHROID) 25 MCG tablet, Take 25 mcg by mouth daily before breakfast., Disp: , Rfl:  .  lisinopril (PRINIVIL,ZESTRIL) 40 MG tablet, Take 40 mg by mouth daily., Disp: , Rfl:  .  potassium chloride SA (KLOR-CON) 20 MEQ tablet, TAKE ONE TABLET BY MOUTH EVERY DAY, Disp: 30 tablet, Rfl: 6 .  simvastatin (ZOCOR) 40 MG tablet, Take 40 mg by mouth daily., Disp: , Rfl:    No Known Allergies  ROS Review of Systems  Constitutional: Negative for fever.  HENT: Positive for rhinorrhea. Negative for trouble swallowing.   Eyes: Negative.   Respiratory: Negative for shortness of breath.   Cardiovascular: Negative for chest pain.  Gastrointestinal: Negative.   Endocrine: Negative.   Genitourinary: Negative.    Musculoskeletal:       Reports right shoulder pain  Skin: Negative.   Allergic/Immunologic: Negative.   Neurological: Negative.   Hematological: Negative.   Psychiatric/Behavioral: Negative.   All other systems reviewed and are negative.    OBJECTIVE:    Physical Exam Vitals reviewed.  Constitutional:      Appearance: Normal appearance.  HENT:     Mouth/Throat:     Mouth: Mucous membranes are moist.  Eyes:     Pupils: Pupils are equal, round, and reactive to light.  Neck:     Vascular: No carotid bruit.  Cardiovascular:     Rate and Rhythm: Normal rate and regular rhythm.     Pulses: Normal pulses.     Heart sounds: Normal heart sounds.  Pulmonary:     Effort: Pulmonary effort is normal.     Breath sounds: Normal breath sounds.  Abdominal:     General: Bowel sounds are normal.     Palpations: Abdomen is soft. There is no hepatomegaly, splenomegaly or mass.     Tenderness: There is no abdominal tenderness.     Hernia: No hernia is present.  Musculoskeletal:        General: No tenderness.     Cervical back: Neck supple.     Right lower leg: No edema.     Left lower leg: No edema.  Skin:    Findings: No rash.  Neurological:     Mental Status: She is alert and oriented to person, place, and time.     Gait: Gait abnormal.  Psychiatric:        Mood and Affect: Mood and affect normal.        Behavior: Behavior normal.     BP (!) 152/61   Pulse 61   Ht 4\' 11"  (1.499 m)   Wt 108 lb 1.6 oz (49 kg)   BMI 21.83 kg/m  Wt Readings from Last 3 Encounters:  05/08/20 108 lb 1.6 oz (49 kg)  03/15/20 106 lb 8 oz (48.3 kg)  05/23/18 114 lb 6.7 oz (51.9 kg)    Health Maintenance Due  Topic Date Due  . COVID-19 Vaccine (1) Never done  . TETANUS/TDAP  Never done  . DEXA SCAN  Never done  . PNA vac Low Risk Adult (1 of 2 - PCV13) Never done  . INFLUENZA VACCINE  05/05/2020    There are no preventive care reminders to display for this patient.  CBC Latest Ref Rng &  Units 03/15/2020 05/23/2018 03/09/2018  WBC 3.8 - 10.8 Thousand/uL 13.3(H) 9.3 7.8  Hemoglobin 11.7 - 15.5 g/dL 13.8 13.6 12.5  Hematocrit  35 - 45 % 42.0 39.9 36.7  Platelets 140 - 400 Thousand/uL 175 197 207   CMP Latest Ref Rng & Units 05/23/2018 03/09/2018 02/17/2017  Glucose 70 - 99 mg/dL 96 181(H) 80  BUN 8 - 23 mg/dL 32(H) 24(H) 25  Creatinine 0.44 - 1.00 mg/dL 1.27(H) 1.44(H) 1.19(H)  Sodium 135 - 145 mmol/L 139 138 141  Potassium 3.5 - 5.1 mmol/L 4.7 4.4 4.6  Chloride 98 - 111 mmol/L 103 105 101  CO2 22 - 32 mmol/L 28 24 19   Calcium 8.9 - 10.3 mg/dL 8.9 8.8(L) 9.1  Total Protein 6.5 - 8.1 g/dL 6.4(L) 6.2(L) -  Total Bilirubin 0.3 - 1.2 mg/dL 0.7 0.6 -  Alkaline Phos 38 - 126 U/L 77 64 -  AST 15 - 41 U/L 31 34 -  ALT 0 - 44 U/L 36 34 -    Lab Results  Component Value Date   TSH 1.71 03/15/2020   Lab Results  Component Value Date   ALBUMIN 3.7 05/23/2018   ANIONGAP 8 05/23/2018   No results found for: CHOL, HDL, LDLCALC, CHOLHDL No results found for: TRIG Lab Results  Component Value Date   HGBA1C 6.0 08/24/2012      ASSESSMENT & PLAN:   Problem List Items Addressed This Visit      Cardiovascular and Mediastinum   PAF (paroxysmal atrial fibrillation) ([place]) - Primary    The patient is taking Eliquis for atrial fibrillation and her previous history of vertebral basilar stroke.         Respiratory   Seasonal allergic rhinitis due to pollen    The patient was advised to take Claritin 5 mg twice daily.        Endocrine   Acquired hypothyroidism    Stable.        Musculoskeletal and Integument   Rotator cuff arthropathy of right shoulder    The patient complained of some pain in the right shoulder. I suggested that she stay active.        Other   Memory change    She states that she has intermittent memory lapses. Recent memory is within normal limits. Past memory is sketchy.          No orders of the defined types were placed in this  encounter.   Follow-up: Return in about 2 months (around 07/08/2020).    Dr. Jane Canary [facility] [address], [place], [place] [address]   By signing my name below, I, Clerance Lav, attest that this documentation has been prepared under the direction and in the presence of Cletis Athens, MD. Electronically Signed: Cletis Athens, MD 05/08/20, 9:20 AM   I personally performed the services described in this documentation, which was SCRIBED in my presence. The recorded information has been reviewed and considered accurate. It has been edited as necessary during review. Cletis Athens, MD

## 2020-05-08 NOTE — Assessment & Plan Note (Signed)
She states that she has intermittent memory lapses. Recent memory is within normal limits. Past memory is sketchy.

## 2020-05-08 NOTE — Assessment & Plan Note (Addendum)
The patient complained of some pain in the right shoulder. I suggested that she stay active.

## 2020-05-08 NOTE — Assessment & Plan Note (Signed)
The patient is taking Eliquis for atrial fibrillation and her previous history of vertebral basilar stroke.

## 2020-05-09 ENCOUNTER — Ambulatory Visit: Payer: Medicare Other | Admitting: Internal Medicine

## 2020-06-04 ENCOUNTER — Ambulatory Visit (INDEPENDENT_AMBULATORY_CARE_PROVIDER_SITE_OTHER): Payer: Medicare Other | Admitting: *Deleted

## 2020-06-04 DIAGNOSIS — I441 Atrioventricular block, second degree: Secondary | ICD-10-CM

## 2020-06-04 LAB — CUP PACEART REMOTE DEVICE CHECK
Battery Remaining Longevity: 118 mo
Battery Remaining Percentage: 95.5 %
Battery Voltage: 3.04 V
Brady Statistic AP VP Percent: 1 %
Brady Statistic AP VS Percent: 99 %
Brady Statistic AS VP Percent: 1 %
Brady Statistic AS VS Percent: 1 %
Brady Statistic RA Percent Paced: 99 %
Brady Statistic RV Percent Paced: 1 %
Date Time Interrogation Session: 20210831020030
Implantable Lead Implant Date: 20180524
Implantable Lead Implant Date: 20180524
Implantable Lead Location: 753859
Implantable Lead Location: 753860
Implantable Lead Model: 3830
Implantable Pulse Generator Implant Date: 20180524
Lead Channel Impedance Value: 510 Ohm
Lead Channel Impedance Value: 560 Ohm
Lead Channel Pacing Threshold Amplitude: 0.75 V
Lead Channel Pacing Threshold Amplitude: 0.75 V
Lead Channel Pacing Threshold Pulse Width: 0.5 ms
Lead Channel Pacing Threshold Pulse Width: 0.5 ms
Lead Channel Sensing Intrinsic Amplitude: 10.8 mV
Lead Channel Sensing Intrinsic Amplitude: 2.2 mV
Lead Channel Setting Pacing Amplitude: 2 V
Lead Channel Setting Pacing Amplitude: 2.5 V
Lead Channel Setting Pacing Pulse Width: 0.5 ms
Lead Channel Setting Sensing Sensitivity: 2 mV
Pulse Gen Model: 2272
Pulse Gen Serial Number: 8911338

## 2020-06-05 NOTE — Progress Notes (Signed)
Remote pacemaker transmission.   

## 2020-06-14 DIAGNOSIS — Z20828 Contact with and (suspected) exposure to other viral communicable diseases: Secondary | ICD-10-CM | POA: Diagnosis not present

## 2020-07-16 ENCOUNTER — Ambulatory Visit: Payer: Medicare Other | Admitting: Internal Medicine

## 2020-07-24 ENCOUNTER — Ambulatory Visit: Payer: Medicare Other | Admitting: Internal Medicine

## 2020-08-01 ENCOUNTER — Encounter: Payer: Self-pay | Admitting: Family Medicine

## 2020-08-01 ENCOUNTER — Ambulatory Visit (INDEPENDENT_AMBULATORY_CARE_PROVIDER_SITE_OTHER): Payer: Medicare Other | Admitting: Family Medicine

## 2020-08-01 ENCOUNTER — Other Ambulatory Visit: Payer: Self-pay

## 2020-08-01 VITALS — BP 132/73 | HR 68 | Ht <= 58 in | Wt 106.3 lb

## 2020-08-01 DIAGNOSIS — Z0000 Encounter for general adult medical examination without abnormal findings: Secondary | ICD-10-CM | POA: Diagnosis not present

## 2020-08-01 DIAGNOSIS — I480 Paroxysmal atrial fibrillation: Secondary | ICD-10-CM | POA: Diagnosis not present

## 2020-08-01 DIAGNOSIS — Z9181 History of falling: Secondary | ICD-10-CM | POA: Diagnosis not present

## 2020-08-01 DIAGNOSIS — Z23 Encounter for immunization: Secondary | ICD-10-CM | POA: Diagnosis not present

## 2020-08-01 DIAGNOSIS — H9193 Unspecified hearing loss, bilateral: Secondary | ICD-10-CM

## 2020-08-01 MED ORDER — APIXABAN 2.5 MG PO TABS: 2.5000 mg | ORAL_TABLET | Freq: Every day | ORAL | 0 refills | Status: DC

## 2020-08-01 NOTE — Assessment & Plan Note (Signed)
Decreased hearing bilat, does not use hearing aides

## 2020-08-01 NOTE — Progress Notes (Signed)
Established Patient Office Visit  SUBJECTIVE:  Subjective  Patient ID: Kimberly Hood, female    DOB: [birth year]  Age: 86 y.o. MRN: [id number]  CC:  Chief Complaint  Patient presents with  . Annual Exam    HPI Kimberly Hood is a 86 y.o. female presenting today for annual exam    Past Medical History:  Diagnosis Date  . Heart attack ([place])   . Hypertension   . MI (myocardial infarction) ([place]) 2009  . Presence of permanent cardiac pacemaker 02/24/2017  . Stroke [facility])     Past Surgical History:  Procedure Laterality Date  . ABDOMINAL HYSTERECTOMY    . APPENDECTOMY    . PACEMAKER IMPLANT N/A 02/25/2017   Procedure: Pacemaker Implant;  Surgeon: Evans Lance, MD;  Location: [place] [facility];  Service: Cardiovascular;  Laterality: N/A;  . TONSILLECTOMY      Family History  Problem Relation Age of Onset  . Diabetes Mother   . Heart attack Mother   . Heart attack Father   . Brain cancer Sister   . Hypertension Sister     Social History   Socioeconomic History  . Marital status: Widowed    Spouse name: Not on file  . Number of children: Not on file  . Years of education: Not on file  . Highest education level: Not on file  Occupational History  . Not on file  Tobacco Use  . Smoking status: Never Smoker  . Smokeless tobacco: Never Used  Vaping Use  . Vaping Use: Never used  Substance and Sexual Activity  . Alcohol use: No  . Drug use: No  . Sexual activity: Not on file  Other Topics Concern  . Not on file  Social History Narrative  . Not on file   Social Determinants of Health   Financial Resource Strain:   . Difficulty of Paying Living Expenses: Not on file  Food Insecurity:   . Worried About Charity fundraiser in the Last Year: Not on file  . Ran Out of Food in the Last Year: Not on file  Transportation Needs:   . Lack of Transportation (Medical): Not on file  . Lack of Transportation (Non-Medical): Not on file  Physical  Activity:   . Days of Exercise per Week: Not on file  . Minutes of Exercise per Session: Not on file  Stress:   . Feeling of Stress : Not on file  Social Connections:   . Frequency of Communication with Friends and Family: Not on file  . Frequency of Social Gatherings with Friends and Family: Not on file  . Attends Religious Services: Not on file  . Active Member of Clubs or Organizations: Not on file  . Attends Archivist Meetings: Not on file  . Marital Status: Not on file  Intimate Partner Violence:   . Fear of Current or Ex-Partner: Not on file  . Emotionally Abused: Not on file  . Physically Abused: Not on file  . Sexually Abused: Not on file     Current Outpatient Medications:  .  amiodarone (PACERONE) 100 MG tablet, Take 1 tablet (100 mg total) by mouth daily., Disp: 90 tablet, Rfl: 3 .  ELIQUIS 5 MG TABS tablet, TAKE ONE TABLET TWICE DAILY, Disp: 60 tablet, Rfl: 6 .  ezetimibe (ZETIA) 10 MG tablet, Take 10 mg by mouth daily., Disp: , Rfl:  .  levothyroxine (SYNTHROID) 25 MCG tablet, Take 25 mcg by mouth daily before breakfast.,  Disp: , Rfl:  .  lisinopril (PRINIVIL,ZESTRIL) 40 MG tablet, Take 40 mg by mouth daily., Disp: , Rfl:  .  potassium chloride SA (KLOR-CON) 20 MEQ tablet, TAKE ONE TABLET BY MOUTH EVERY DAY, Disp: 30 tablet, Rfl: 6 .  simvastatin (ZOCOR) 40 MG tablet, Take 40 mg by mouth daily., Disp: , Rfl:    No Known Allergies  ROS Review of Systems  Psychiatric/Behavioral: Positive for confusion.  All other systems reviewed and are negative.    OBJECTIVE:    Physical Exam Vitals and nursing note reviewed.  HENT:     Head: Normocephalic.     Nose: Nose normal.  Eyes:     Pupils: Pupils are equal, round, and reactive to light.  Cardiovascular:     Rate and Rhythm: Rhythm irregular.  Abdominal:     General: Bowel sounds are normal.  Musculoskeletal:        General: Normal range of motion.     Cervical back: Normal range of motion.    Neurological:     Mental Status: She is alert. Mental status is at baseline.  Psychiatric:        Mood and Affect: Mood normal.     BP 132/73   Pulse 68   Ht 4\' 10"  (1.473 m)   Wt 106 lb 4.8 oz (48.2 kg)   BMI 22.22 kg/m  Wt Readings from Last 3 Encounters:  08/01/20 106 lb 4.8 oz (48.2 kg)  05/08/20 108 lb 1.6 oz (49 kg)  03/15/20 106 lb 8 oz (48.3 kg)    Health Maintenance Due  Topic Date Due  . TETANUS/TDAP  Never done  . DEXA SCAN  Never done  . PNA vac Low Risk Adult (1 of 2 - PCV13) Never done    There are no preventive care reminders to display for this patient.  CBC Latest Ref Rng & Units 03/15/2020 05/23/2018 03/09/2018  WBC 3.8 - 10.8 Thousand/uL 13.3(H) 9.3 7.8  Hemoglobin 11.7 - 15.5 g/dL 13.8 13.6 12.5  Hematocrit 35 - 45 % 42.0 39.9 36.7  Platelets 140 - 400 Thousand/uL 175 197 207   CMP Latest Ref Rng & Units 05/23/2018 03/09/2018 02/17/2017  Glucose 70 - 99 mg/dL 96 181(H) 80  BUN 8 - 23 mg/dL 32(H) 24(H) 25  Creatinine 0.44 - 1.00 mg/dL 1.27(H) 1.44(H) 1.19(H)  Sodium 135 - 145 mmol/L 139 138 141  Potassium 3.5 - 5.1 mmol/L 4.7 4.4 4.6  Chloride 98 - 111 mmol/L 103 105 101  CO2 22 - 32 mmol/L 28 24 19   Calcium 8.9 - 10.3 mg/dL 8.9 8.8(L) 9.1  Total Protein 6.5 - 8.1 g/dL 6.4(L) 6.2(L) -  Total Bilirubin 0.3 - 1.2 mg/dL 0.7 0.6 -  Alkaline Phos 38 - 126 U/L 77 64 -  AST 15 - 41 U/L 31 34 -  ALT 0 - 44 U/L 36 34 -    Lab Results  Component Value Date   TSH 1.71 03/15/2020   Lab Results  Component Value Date   ALBUMIN 3.7 05/23/2018   ANIONGAP 8 05/23/2018   No results found for: CHOL, HDL, LDLCALC, CHOLHDL No results found for: TRIG Lab Results  Component Value Date   HGBA1C 6.0 08/24/2012      ASSESSMENT & PLAN:   Problem List Items Addressed This Visit      Cardiovascular and Mediastinum   PAF (paroxysmal atrial fibrillation) ([facility])    Irreg HR today, normal rate, her family member informed me that the pt's insurance  company called  and suggested that her dose of Eliquis be decreased from 5 mg to 2.5 mg.         Other   Annual physical exam - Primary    No preventative measures needed at this time. Discussed in detail fall prevention measures. Pt actually attends core strength and balance classes at her assisted living.       Decreased hearing of both ears    Decreased hearing bilat, does not use hearing aides      At moderate risk for fall    Discussed in detail fall prevention measures. Pt actually attends core strength and balance classes at her assisted living.        Need for influenza vaccination    Vaccine given      Relevant Orders   Flu Vaccine QUAD High Dose(Fluad) (Completed)      No orders of the defined types were placed in this encounter.     Follow-up: No follow-ups on file.    Beckie Salts, [facility] [address] [place], [place] [address]

## 2020-08-01 NOTE — Assessment & Plan Note (Addendum)
Irreg HR today, normal rate, her family member informed me that the pt's insurance company called and suggested that her dose of Eliquis be decreased from 5 mg to 2.5 mg.

## 2020-08-01 NOTE — Assessment & Plan Note (Signed)
Vaccine given

## 2020-08-01 NOTE — Assessment & Plan Note (Signed)
No preventative measures needed at this time. Discussed in detail fall prevention measures. Pt actually attends core strength and balance classes at her assisted living.

## 2020-08-01 NOTE — Assessment & Plan Note (Signed)
Discussed in detail fall prevention measures. Pt actually attends core strength and balance classes at her assisted living.

## 2020-08-02 MED ORDER — APIXABAN 2.5 MG PO TABS: 2.5000 mg | ORAL_TABLET | Freq: Two times a day (BID) | ORAL | 0 refills | Status: DC

## 2020-08-02 NOTE — Addendum Note (Signed)
Addended by: Beckie Salts on: 08/02/2020 03:29 PM   Modules accepted: Orders

## 2020-08-21 ENCOUNTER — Other Ambulatory Visit: Payer: Self-pay | Admitting: Internal Medicine

## 2020-09-03 ENCOUNTER — Ambulatory Visit (INDEPENDENT_AMBULATORY_CARE_PROVIDER_SITE_OTHER): Payer: Medicare Other

## 2020-09-03 DIAGNOSIS — I441 Atrioventricular block, second degree: Secondary | ICD-10-CM | POA: Diagnosis not present

## 2020-09-03 LAB — CUP PACEART REMOTE DEVICE CHECK
Battery Remaining Longevity: 118 mo
Battery Remaining Percentage: 95.5 %
Battery Voltage: 3.04 V
Brady Statistic AP VP Percent: 1 %
Brady Statistic AP VS Percent: 99 %
Brady Statistic AS VP Percent: 1 %
Brady Statistic AS VS Percent: 1 %
Brady Statistic RA Percent Paced: 99 %
Brady Statistic RV Percent Paced: 1 %
Date Time Interrogation Session: 20211130032115
Implantable Lead Implant Date: 20180524
Implantable Lead Implant Date: 20180524
Implantable Lead Location: 753859
Implantable Lead Location: 753860
Implantable Lead Model: 3830
Implantable Pulse Generator Implant Date: 20180524
Lead Channel Impedance Value: 510 Ohm
Lead Channel Impedance Value: 550 Ohm
Lead Channel Pacing Threshold Amplitude: 0.75 V
Lead Channel Pacing Threshold Amplitude: 0.75 V
Lead Channel Pacing Threshold Pulse Width: 0.5 ms
Lead Channel Pacing Threshold Pulse Width: 0.5 ms
Lead Channel Sensing Intrinsic Amplitude: 1 mV
Lead Channel Sensing Intrinsic Amplitude: 9.9 mV
Lead Channel Setting Pacing Amplitude: 2 V
Lead Channel Setting Pacing Amplitude: 2.5 V
Lead Channel Setting Pacing Pulse Width: 0.5 ms
Lead Channel Setting Sensing Sensitivity: 2 mV
Pulse Gen Model: 2272
Pulse Gen Serial Number: 8911338

## 2020-09-09 NOTE — Progress Notes (Signed)
Remote pacemaker transmission.   

## 2020-09-16 ENCOUNTER — Other Ambulatory Visit: Payer: Self-pay | Admitting: Internal Medicine

## 2020-09-16 ENCOUNTER — Other Ambulatory Visit: Payer: Self-pay | Admitting: *Deleted

## 2020-09-16 MED ORDER — LEVOTHYROXINE SODIUM 25 MCG PO TABS: 25.0000 ug | ORAL_TABLET | Freq: Every day | ORAL | 6 refills | Status: DC

## 2020-10-10 ENCOUNTER — Encounter: Payer: Self-pay | Admitting: Family Medicine

## 2020-10-10 ENCOUNTER — Ambulatory Visit (INDEPENDENT_AMBULATORY_CARE_PROVIDER_SITE_OTHER): Payer: Medicare Other | Admitting: Family Medicine

## 2020-10-10 ENCOUNTER — Other Ambulatory Visit: Payer: Self-pay

## 2020-10-10 VITALS — BP 169/62 | HR 60 | Ht <= 58 in | Wt 105.3 lb

## 2020-10-10 DIAGNOSIS — I480 Paroxysmal atrial fibrillation: Secondary | ICD-10-CM | POA: Diagnosis not present

## 2020-10-10 DIAGNOSIS — I10 Essential (primary) hypertension: Secondary | ICD-10-CM

## 2020-10-10 NOTE — Assessment & Plan Note (Signed)
Manual BP 138/78 , asymptomatic, Denies Ha CP or SOB.

## 2020-10-10 NOTE — Progress Notes (Signed)
Established Patient Office Visit  SUBJECTIVE:  Subjective  Patient ID: Kimberly Hood, female    DOB: Mar 17, 1926  Age: 87 y.o. MRN: [id number]  CC:  Chief Complaint  Patient presents with  . Hypertension    Patient is here for her 3 month routine BP check    HPI Kimberly Hood is a 87 y.o. female presenting today for     Past Medical History:  Diagnosis Date  . Heart attack ([facility])   . Hypertension   . MI (myocardial infarction) ([facility]) 2009  . Presence of permanent cardiac pacemaker 02/24/2017  . Stroke [facility])     Past Surgical History:  Procedure Laterality Date  . ABDOMINAL HYSTERECTOMY    . APPENDECTOMY    . PACEMAKER IMPLANT N/A 02/25/2017   Procedure: Pacemaker Implant;  Surgeon: Marinus Maw, MD;  Location: [facility] INVASIVE CV LAB;  Service: Cardiovascular;  Laterality: N/A;  . TONSILLECTOMY      Family History  Problem Relation Age of Onset  . Diabetes Mother   . Heart attack Mother   . Heart attack Father   . Brain cancer Sister   . Hypertension Sister     Social History   Socioeconomic History  . Marital status: Widowed    Spouse name: Not on file  . Number of children: Not on file  . Years of education: Not on file  . Highest education level: Not on file  Occupational History  . Not on file  Tobacco Use  . Smoking status: Never Smoker  . Smokeless tobacco: Never Used  Vaping Use  . Vaping Use: Never used  Substance and Sexual Activity  . Alcohol use: No  . Drug use: No  . Sexual activity: Not on file  Other Topics Concern  . Not on file  Social History Narrative  . Not on file   Social Determinants of Health   Financial Resource Strain: Not on file  Food Insecurity: Not on file  Transportation Needs: Not on file  Physical Activity: Not on file  Stress: Not on file  Social Connections: Not on file  Intimate Partner Violence: Not on file     Current Outpatient Medications:  .  amiodarone (PACERONE) 100 MG tablet,  Take 1 tablet (100 mg total) by mouth daily., Disp: 90 tablet, Rfl: 3 .  apixaban (ELIQUIS) 2.5 MG TABS tablet, Take 1 tablet (2.5 mg total) by mouth 2 (two) times daily., Disp: 180 tablet, Rfl: 0 .  ELIQUIS 5 MG TABS tablet, TAKE ONE TABLET TWICE DAILY, Disp: 60 tablet, Rfl: 6 .  ezetimibe (ZETIA) 10 MG tablet, TAKE 1 TABLET BY MOUTH DAILY, Disp: 30 tablet, Rfl: 6 .  levothyroxine (LEVOXYL) 25 MCG tablet, Take 1 tablet (25 mcg total) by mouth daily before breakfast., Disp: 30 tablet, Rfl: 6 .  levothyroxine (SYNTHROID) 25 MCG tablet, Take 25 mcg by mouth daily before breakfast., Disp: , Rfl:  .  lisinopril (ZESTRIL) 40 MG tablet, TAKE ONE-HALF TABLET BY MOUTH EVERY DAY, Disp: 30 tablet, Rfl: 6 .  potassium chloride SA (KLOR-CON) 20 MEQ tablet, TAKE ONE TABLET BY MOUTH EVERY DAY, Disp: 30 tablet, Rfl: 6 .  simvastatin (ZOCOR) 40 MG tablet, TAKE ONE TABLET AT BEDTIME, Disp: 30 tablet, Rfl: 6   No Known Allergies  ROS Review of Systems  Constitutional: Negative.   HENT: Negative.   Respiratory: Negative.   Cardiovascular: Negative.   Musculoskeletal: Negative.   Neurological: Negative.   Psychiatric/Behavioral: Negative.   All other systems  reviewed and are negative.    OBJECTIVE:    Physical Exam Constitutional:      Appearance: Normal appearance.  HENT:     Head: Normocephalic.     Mouth/Throat:     Mouth: Mucous membranes are moist.  Eyes:     Pupils: Pupils are equal, round, and reactive to light.  Cardiovascular:     Rate and Rhythm: Normal rate and regular rhythm.     Heart sounds: Murmur heard.    Pulmonary:     Effort: Pulmonary effort is normal.  Abdominal:     General: Abdomen is flat.  Musculoskeletal:        General: Normal range of motion.     Cervical back: Normal range of motion.  Neurological:     General: No focal deficit present.     Mental Status: She is alert.  Psychiatric:        Mood and Affect: Mood normal.     BP (!) 169/62   Pulse 60    Ht 4\' 8"  (1.422 m)   Wt 105 lb 4.8 oz (47.8 kg)   BMI 23.61 kg/m  Wt Readings from Last 3 Encounters:  10/10/20 105 lb 4.8 oz (47.8 kg)  08/01/20 106 lb 4.8 oz (48.2 kg)  05/08/20 108 lb 1.6 oz (49 kg)    Health Maintenance Due  Topic Date Due  . TETANUS/TDAP  Never done  . DEXA SCAN  Never done  . PNA vac Low Risk Adult (1 of 2 - PCV13) Never done    There are no preventive care reminders to display for this patient.  CBC Latest Ref Rng & Units 03/15/2020 05/23/2018 03/09/2018  WBC 3.8 - 10.8 Thousand/uL 13.3(H) 9.3 7.8  Hemoglobin 11.7 - 15.5 g/dL 13.8 13.6 12.5  Hematocrit 35.0 - 45.0 % 42.0 39.9 36.7  Platelets 140 - 400 Thousand/uL 175 197 207   CMP Latest Ref Rng & Units 05/23/2018 03/09/2018 02/17/2017  Glucose 70 - 99 mg/dL 96 181(H) 80  BUN 8 - 23 mg/dL 32(H) 24(H) 25  Creatinine 0.44 - 1.00 mg/dL 1.27(H) 1.44(H) 1.19(H)  Sodium 135 - 145 mmol/L 139 138 141  Potassium 3.5 - 5.1 mmol/L 4.7 4.4 4.6  Chloride 98 - 111 mmol/L 103 105 101  CO2 22 - 32 mmol/L 28 24 19   Calcium 8.9 - 10.3 mg/dL 8.9 8.8(L) 9.1  Total Protein 6.5 - 8.1 g/dL 6.4(L) 6.2(L) -  Total Bilirubin 0.3 - 1.2 mg/dL 0.7 0.6 -  Alkaline Phos 38 - 126 U/L 77 64 -  AST 15 - 41 U/L 31 34 -  ALT 0 - 44 U/L 36 34 -    Lab Results  Component Value Date   TSH 1.71 03/15/2020   Lab Results  Component Value Date   ALBUMIN 3.7 05/23/2018   ANIONGAP 8 05/23/2018   No results found for: CHOL, HDL, LDLCALC, CHOLHDL No results found for: TRIG Lab Results  Component Value Date   HGBA1C 6.0 08/24/2012      ASSESSMENT & PLAN:   Problem List Items Addressed This Visit      Cardiovascular and Mediastinum   PAF (paroxysmal atrial fibrillation) (HCC)    RRR today, no sob, Murmur present per norm for her.       Primary hypertension - Primary    Manual BP 138/78 , asymptomatic, Denies Ha CP or SOB.          No orders of the defined types were placed in this encounter.  Follow-up: No follow-ups  on file.    Irish Lack, FNP [facility] [address] [place], [place] [address]

## 2020-10-10 NOTE — Assessment & Plan Note (Signed)
RRR today, no sob, Murmur present per norm for her.

## 2020-10-15 ENCOUNTER — Other Ambulatory Visit: Payer: Self-pay | Admitting: Internal Medicine

## 2020-11-08 DIAGNOSIS — D045 Carcinoma in situ of skin of trunk: Secondary | ICD-10-CM | POA: Diagnosis not present

## 2020-11-08 DIAGNOSIS — L570 Actinic keratosis: Secondary | ICD-10-CM | POA: Diagnosis not present

## 2020-11-08 DIAGNOSIS — X32XXXA Exposure to sunlight, initial encounter: Secondary | ICD-10-CM | POA: Diagnosis not present

## 2020-11-08 DIAGNOSIS — D485 Neoplasm of uncertain behavior of skin: Secondary | ICD-10-CM | POA: Diagnosis not present

## 2020-11-11 ENCOUNTER — Encounter: Payer: Self-pay | Admitting: Internal Medicine

## 2020-11-11 ENCOUNTER — Ambulatory Visit (INDEPENDENT_AMBULATORY_CARE_PROVIDER_SITE_OTHER): Payer: Medicare Other | Admitting: Internal Medicine

## 2020-11-11 ENCOUNTER — Other Ambulatory Visit: Payer: Self-pay

## 2020-11-11 VITALS — BP 122/62 | HR 60 | Ht <= 58 in | Wt 104.6 lb

## 2020-11-11 DIAGNOSIS — I480 Paroxysmal atrial fibrillation: Secondary | ICD-10-CM | POA: Diagnosis not present

## 2020-11-11 DIAGNOSIS — I441 Atrioventricular block, second degree: Secondary | ICD-10-CM

## 2020-11-11 DIAGNOSIS — Z950 Presence of cardiac pacemaker: Secondary | ICD-10-CM

## 2020-11-11 DIAGNOSIS — Z79899 Other long term (current) drug therapy: Secondary | ICD-10-CM | POA: Diagnosis not present

## 2020-11-11 DIAGNOSIS — R001 Bradycardia, unspecified: Secondary | ICD-10-CM | POA: Diagnosis not present

## 2020-11-11 MED ORDER — APIXABAN 2.5 MG PO TABS: 2.5000 mg | ORAL_TABLET | Freq: Two times a day (BID) | ORAL | 11 refills | Status: DC

## 2020-11-11 NOTE — Patient Instructions (Signed)
Medication Instructions:  - Your physician has recommended you make the following change in your medication:   1) STOP amiodarone  2) DECREASE eliquis to 2.5 mg- take 1 tablet by mouth TWICE daily  *If you need a refill on your cardiac medications before your next appointment, please call your pharmacy*   Lab Work: - Will recheck a thyroid panel at your office visit in 3 months  If you have labs (blood work) drawn today and your tests are completely normal, you will receive your results only by: Marland Kitchen MyChart Message (if you have MyChart) OR . A paper copy in the mail If you have any lab test that is abnormal or we need to change your treatment, we will call you to review the results.   Testing/Procedures: - none ordered   Follow-Up: At [facility], you and your health needs are our priority.  As part of our continuing mission to provide you with exceptional heart care, we have created designated Provider Care Teams.  These Care Teams include your primary Cardiologist (physician) and Advanced Practice Providers (APPs -  Physician Assistants and Nurse Practitioners) who all work together to provide you with the care you need, when you need it.  We recommend signing up for the patient portal called "MyChart".  Sign up information is provided on this After Visit Summary.  MyChart is used to connect with patients for Virtual Visits (Telemedicine).  Patients are able to view lab/test results, encounter notes, upcoming appointments, etc.  Non-urgent messages can be sent to your provider as well.   To learn more about what you can do with MyChart, go to [email or web address].    Your next appointment:   3 month(s)  The format for your next appointment:   In Person  Provider:   Virl Axe, MD   Other Instructions n/a

## 2020-11-11 NOTE — Progress Notes (Signed)
Patient Care Team: Cletis Athens, MD as PCP - General (Cardiology)   HPI  Kimberly Hood is a 87 y.o. female Seen in followup for pacemaker (St Jude  His Bundle)  Implanted (GT)  for syncope with bifascicular block (5/18) Prior hx of reported AFib with CVA on warfarin.  Started on amio fall/winter  2018/19    5/18 Echo EF 60-65%  AS grad mild 17 mm  Date K Cr TSH LFT-AST Hgb WBC  5/18 4.6 1.19   13.9   6/19    2.69  34  9.3  6/21 1.71  1.71 (T3 49 ( nl > 76-181)  13.8 13.3      The patient denies chest pain, shortness of breath, nocturnal dyspnea, orthopnea or peripheral edema.  There have been no palpitations, lightheadedness or syncope.    Family wonders about Apixoban  And other meds    Past Medical History:  Diagnosis Date  . Heart attack ([place])   . Hypertension   . MI (myocardial infarction) ([place]) 2009  . Presence of permanent cardiac pacemaker 02/24/2017  . Stroke [facility])     Past Surgical History:  Procedure Laterality Date  . ABDOMINAL HYSTERECTOMY    . APPENDECTOMY    . PACEMAKER IMPLANT N/A 02/25/2017   Procedure: Pacemaker Implant;  Surgeon: Evans Lance, MD;  Location: [place] [facility];  Service: Cardiovascular;  Laterality: N/A;  . TONSILLECTOMY      Current Outpatient Medications  Medication Sig Dispense Refill  . apixaban (ELIQUIS) 2.5 MG TABS tablet Take 1 tablet (2.5 mg total) by mouth 2 (two) times daily. 60 tablet 11  . donepezil (ARICEPT) 10 MG tablet Take 10 mg by mouth daily.    Marland Kitchen ezetimibe (ZETIA) 10 MG tablet TAKE 1 TABLET BY MOUTH DAILY 30 tablet 6  . levothyroxine (SYNTHROID) 25 MCG tablet Take 25 mcg by mouth daily before breakfast.    . lisinopril (ZESTRIL) 40 MG tablet TAKE ONE-HALF TABLET BY MOUTH EVERY DAY 30 tablet 6  . potassium chloride SA (KLOR-CON) 20 MEQ tablet TAKE ONE TABLET BY MOUTH EVERY DAY 30 tablet 6  . simvastatin (ZOCOR) 40 MG tablet TAKE ONE TABLET AT BEDTIME 30 tablet 6   No current  facility-administered medications for this visit.    No Known Allergies    Review of Systems negative except from HPI and PMH  Physical Exam BP 122/62   Pulse 60   Ht 4\' 8"  (1.422 m)   Wt 104 lb 9.6 oz (47.4 kg)   SpO2 98%   BMI 23.45 kg/m  Thin and cachectic woman in no acute distress HENT normal Neck supple with JVP-flat Clear Device pocket well healed; without hematoma or erythema.  There is no tethering  Regular rate and rhythm, no  gallop 2/6 murmur  S2 split Abd-soft with active BS No Clubbing cyanosis   edema Skin-warm and dry A & Oriented  Grossly normal sensory and motor function  ECG A pacing @ 60 25/08/43   Assessment and  Plan  Sinus bradycardia  Syncope   Pacemaker St Jude   Bifascicular block right bundle branch left posterior fascicular block  Atrial fibrillation   Stroke  High Risk Medication Surveillance -Amiodarone   Aortic stenosis  Hypothyroidism     Hypertension   Minimal HR excursion, but no limitations in this active (sort of) 87 yo lady  No significant interval AFib, longest duration episode in 2 1/2 yrs is 3+ min, so will stop  amio.  Not sure of the original indication for anticoagulation but with prior CVA it is reasonable to continue.  However given age and size will decrease Apixoban  Dose 5>>2.5 bid -- Din[place] asked whether appropriate to continue Apixoban.  Given prior CVA and presuming the correct Dx her age alone does not inform a decision as to stopping   Her mild thyroid abnormality may improve with the discontinuation of amio, will recheck in 3 months  Her AS was mild 2018  S2 still split but hard to gage symptoms so will repeat echo in this spry 87 yo     With BP 108 and 115 at Dr Merrilee Jansky office and with orthostatic intolerance will have her stop her amlodipine-- I see her lisinopril has already been discontinued  Also no interval afib so will decrease amio 200>>100;  Will check amio surveillance labs  On  Anticoagulation;  No bleeding issues   Check hgb n IS SHE CANDIDATE FOR NOAC Therapy  Will defer to DR JM   Will stop zetia-- no data for primary prevention of which I am  Aware in the very elderly     Current medicines are reviewed at length with the patient today .  The patient does not  have concerns regarding medicines.

## 2020-12-03 ENCOUNTER — Ambulatory Visit (INDEPENDENT_AMBULATORY_CARE_PROVIDER_SITE_OTHER): Payer: Medicare Other

## 2020-12-03 DIAGNOSIS — R001 Bradycardia, unspecified: Secondary | ICD-10-CM | POA: Diagnosis not present

## 2020-12-03 LAB — CUP PACEART REMOTE DEVICE CHECK
Battery Remaining Longevity: 118 mo
Battery Remaining Percentage: 95.5 %
Battery Voltage: 3.04 V
Brady Statistic AP VP Percent: 1 %
Brady Statistic AP VS Percent: 99 %
Brady Statistic AS VP Percent: 1 %
Brady Statistic AS VS Percent: 1 %
Brady Statistic RA Percent Paced: 99 %
Brady Statistic RV Percent Paced: 1 %
Date Time Interrogation Session: 20220301020014
Implantable Lead Implant Date: 20180524
Implantable Lead Implant Date: 20180524
Implantable Lead Location: 753859
Implantable Lead Location: 753860
Implantable Lead Model: 3830
Implantable Pulse Generator Implant Date: 20180524
Lead Channel Impedance Value: 510 Ohm
Lead Channel Impedance Value: 540 Ohm
Lead Channel Pacing Threshold Amplitude: 0.5 V
Lead Channel Pacing Threshold Amplitude: 0.75 V
Lead Channel Pacing Threshold Pulse Width: 0.5 ms
Lead Channel Pacing Threshold Pulse Width: 0.5 ms
Lead Channel Sensing Intrinsic Amplitude: 1.1 mV
Lead Channel Sensing Intrinsic Amplitude: 11.4 mV
Lead Channel Setting Pacing Amplitude: 2 V
Lead Channel Setting Pacing Amplitude: 2.5 V
Lead Channel Setting Pacing Pulse Width: 0.5 ms
Lead Channel Setting Sensing Sensitivity: 2 mV
Pulse Gen Model: 2272
Pulse Gen Serial Number: 8911338

## 2020-12-05 DIAGNOSIS — D045 Carcinoma in situ of skin of trunk: Secondary | ICD-10-CM | POA: Diagnosis not present

## 2020-12-11 NOTE — Progress Notes (Signed)
Remote pacemaker transmission.   

## 2020-12-17 ENCOUNTER — Other Ambulatory Visit: Payer: Self-pay | Admitting: Internal Medicine

## 2021-01-06 ENCOUNTER — Encounter: Payer: Self-pay | Admitting: Family Medicine

## 2021-01-07 ENCOUNTER — Ambulatory Visit: Payer: Medicare Other | Admitting: Internal Medicine

## 2021-01-09 ENCOUNTER — Ambulatory Visit (INDEPENDENT_AMBULATORY_CARE_PROVIDER_SITE_OTHER): Payer: Medicare Other | Admitting: Family Medicine

## 2021-01-09 ENCOUNTER — Encounter: Payer: Self-pay | Admitting: Family Medicine

## 2021-01-09 ENCOUNTER — Other Ambulatory Visit: Payer: Self-pay

## 2021-01-09 VITALS — BP 152/70 | HR 59 | Ht 59.0 in | Wt 103.0 lb

## 2021-01-09 DIAGNOSIS — R159 Full incontinence of feces: Secondary | ICD-10-CM | POA: Insufficient documentation

## 2021-01-09 NOTE — Progress Notes (Signed)
Established Patient Office Visit  SUBJECTIVE:  Subjective  Patient ID: Kimberly Hood, female    DOB: 23-Jul-1926  Age: 87 y.o. MRN: [id number]  CC:  Chief Complaint  Patient presents with  . Abdominal Pain    Pt has an upset stomach     HPI Kimberly Hood is a 87 y.o. female presenting today for     Past Medical History:  Diagnosis Date  . Heart attack ([place])   . Hypertension   . MI (myocardial infarction) ([place]) 2009  . Presence of permanent cardiac pacemaker 02/24/2017  . Stroke [facility])     Past Surgical History:  Procedure Laterality Date  . ABDOMINAL HYSTERECTOMY    . APPENDECTOMY    . PACEMAKER IMPLANT N/A 02/25/2017   Procedure: Pacemaker Implant;  Surgeon: Evans Lance, MD;  Location: [place] [facility];  Service: Cardiovascular;  Laterality: N/A;  . TONSILLECTOMY      Family History  Problem Relation Age of Onset  . Diabetes Mother   . Heart attack Mother   . Heart attack Father   . Brain cancer Sister   . Hypertension Sister     Social History   Socioeconomic History  . Marital status: Widowed    Spouse name: Not on file  . Number of children: Not on file  . Years of education: Not on file  . Highest education level: Not on file  Occupational History  . Not on file  Tobacco Use  . Smoking status: Never Smoker  . Smokeless tobacco: Never Used  Vaping Use  . Vaping Use: Never used  Substance and Sexual Activity  . Alcohol use: No  . Drug use: No  . Sexual activity: Not on file  Other Topics Concern  . Not on file  Social History Narrative  . Not on file   Social Determinants of Health   Financial Resource Strain: Not on file  Food Insecurity: Not on file  Transportation Needs: Not on file  Physical Activity: Not on file  Stress: Not on file  Social Connections: Not on file  Intimate Partner Violence: Not on file     Current Outpatient Medications:  .  apixaban (ELIQUIS) 2.5 MG TABS tablet, Take 1 tablet (2.5 mg  total) by mouth 2 (two) times daily., Disp: 60 tablet, Rfl: 11 .  donepezil (ARICEPT) 10 MG tablet, Take 10 mg by mouth daily., Disp: , Rfl:  .  ezetimibe (ZETIA) 10 MG tablet, TAKE 1 TABLET BY MOUTH DAILY, Disp: 30 tablet, Rfl: 6 .  levothyroxine (SYNTHROID) 25 MCG tablet, Take 25 mcg by mouth daily before breakfast., Disp: , Rfl:  .  lisinopril (ZESTRIL) 40 MG tablet, TAKE ONE-HALF TABLET BY MOUTH EVERY DAY, Disp: 30 tablet, Rfl: 6 .  potassium chloride SA (KLOR-CON) 20 MEQ tablet, TAKE ONE TABLET BY MOUTH EVERY DAY, Disp: 30 tablet, Rfl: 6 .  simvastatin (ZOCOR) 40 MG tablet, TAKE ONE TABLET AT BEDTIME, Disp: 30 tablet, Rfl: 6   No Known Allergies  ROS Review of Systems  Constitutional: Negative.   HENT: Negative.   Genitourinary: Negative.   Skin: Negative.   Psychiatric/Behavioral: Positive for confusion and decreased concentration.     OBJECTIVE:    Physical Exam Vitals reviewed.  Constitutional:      Appearance: She is well-developed.  HENT:     Head: Normocephalic.     Mouth/Throat:     Pharynx: Oropharynx is clear.  Cardiovascular:     Rate and Rhythm: Normal rate  and regular rhythm.  Abdominal:     General: Abdomen is flat. There is no distension.     Tenderness: There is no abdominal tenderness.  Neurological:     Mental Status: She is disoriented.     BP (!) 152/70   Pulse (!) 59   Ht 4\' 11"  (1.499 m)   Wt 103 lb (46.7 kg)   BMI 20.80 kg/m  Wt Readings from Last 3 Encounters:  01/09/21 103 lb (46.7 kg)  11/11/20 104 lb 9.6 oz (47.4 kg)  10/10/20 105 lb 4.8 oz (47.8 kg)    Health Maintenance Due  Topic Date Due  . TETANUS/TDAP  Never done  . DEXA SCAN  Never done  . PNA vac Low Risk Adult (1 of 2 - PCV13) Never done    There are no preventive care reminders to display for this patient.  CBC Latest Ref Rng & Units 03/15/2020 05/23/2018 03/09/2018  WBC 3.8 - 10.8 Thousand/uL 13.3(H) 9.3 7.8  Hemoglobin 11.7 - 15.5 g/dL 13.8 13.6 12.5  Hematocrit  35.0 - 45.0 % 42.0 39.9 36.7  Platelets 140 - 400 Thousand/uL 175 197 207   CMP Latest Ref Rng & Units 05/23/2018 03/09/2018 02/17/2017  Glucose 70 - 99 mg/dL 96 181(H) 80  BUN 8 - 23 mg/dL 32(H) 24(H) 25  Creatinine 0.44 - 1.00 mg/dL 1.27(H) 1.44(H) 1.19(H)  Sodium 135 - 145 mmol/L 139 138 141  Potassium 3.5 - 5.1 mmol/L 4.7 4.4 4.6  Chloride 98 - 111 mmol/L 103 105 101  CO2 22 - 32 mmol/L 28 24 19   Calcium 8.9 - 10.3 mg/dL 8.9 8.8(L) 9.1  Total Protein 6.5 - 8.1 g/dL 6.4(L) 6.2(L) -  Total Bilirubin 0.3 - 1.2 mg/dL 0.7 0.6 -  Alkaline Phos 38 - 126 U/L 77 64 -  AST 15 - 41 U/L 31 34 -  ALT 0 - 44 U/L 36 34 -    Lab Results  Component Value Date   TSH 1.71 03/15/2020   Lab Results  Component Value Date   ALBUMIN 3.7 05/23/2018   ANIONGAP 8 05/23/2018   No results found for: CHOL, HDL, LDLCALC, CHOLHDL No results found for: TRIG Lab Results  Component Value Date   HGBA1C 6.0 08/24/2012      ASSESSMENT & PLAN:   Problem List Items Addressed This Visit      Other   Full incontinence of feces - Primary    Patients daughter reports 5-6 episodes of fecal incontinence in the last month. Patient reports 1 regular BM per day.  Plan- I am going to order a stool Culture for O and P and go from there. Discussed with daughter that Colonoscopy is not a good option at this poing with her moms age and stage of dementia.          No orders of the defined types were placed in this encounter.     Follow-up: No follow-ups on file.    Beckie Salts, [place] [address] [place], [place] [address]

## 2021-01-09 NOTE — Assessment & Plan Note (Signed)
Patients daughter reports 5-6 episodes of fecal incontinence in the last month. Patient reports 1 regular BM per day.  Plan- I am going to order a stool Culture for O and P and go from there. Discussed with daughter that Colonoscopy is not a good option at this poing with her moms age and stage of dementia.

## 2021-01-10 DIAGNOSIS — R159 Full incontinence of feces: Secondary | ICD-10-CM | POA: Diagnosis not present

## 2021-01-15 ENCOUNTER — Other Ambulatory Visit: Payer: Self-pay | Admitting: Internal Medicine

## 2021-01-17 LAB — OVA AND PARASITE EXAMINATION
CONCENTRATE RESULT:: NONE SEEN
MICRO NUMBER:: 11748682
SPECIMEN QUALITY:: ADEQUATE
TRICHROME RESULT:: NONE SEEN

## 2021-01-18 LAB — STOOL CULTURE: E coli, Shiga toxin Assay: NEGATIVE

## 2021-02-13 ENCOUNTER — Other Ambulatory Visit: Payer: Self-pay | Admitting: Internal Medicine

## 2021-02-18 ENCOUNTER — Encounter: Payer: Self-pay | Admitting: Internal Medicine

## 2021-02-18 ENCOUNTER — Other Ambulatory Visit: Payer: Self-pay

## 2021-02-18 ENCOUNTER — Ambulatory Visit (INDEPENDENT_AMBULATORY_CARE_PROVIDER_SITE_OTHER): Payer: Medicare Other | Admitting: Internal Medicine

## 2021-02-18 VITALS — BP 92/62 | HR 60 | Ht 59.0 in | Wt 100.0 lb

## 2021-02-18 DIAGNOSIS — R001 Bradycardia, unspecified: Secondary | ICD-10-CM | POA: Diagnosis not present

## 2021-02-18 DIAGNOSIS — I441 Atrioventricular block, second degree: Secondary | ICD-10-CM

## 2021-02-18 DIAGNOSIS — I480 Paroxysmal atrial fibrillation: Secondary | ICD-10-CM

## 2021-02-18 DIAGNOSIS — Z79899 Other long term (current) drug therapy: Secondary | ICD-10-CM

## 2021-02-18 DIAGNOSIS — Z950 Presence of cardiac pacemaker: Secondary | ICD-10-CM | POA: Diagnosis not present

## 2021-02-18 LAB — PACEMAKER DEVICE OBSERVATION

## 2021-02-18 NOTE — Patient Instructions (Signed)
Medication Instructions:  - Your physician has recommended you make the following change in your medication:   1) STOP amiodarone 2) STOP zetia (ezetimibe) 3) STOP lisinopril  - Per [facility], your new pill packs have been delivered to you or should be soon - Please take all of the medications back to [place] and ask for [facility] she will help to remove what has been stopped and make sure the packs are correct - Hayley will be there until 4:30 pm today  *If you need a refill on your cardiac medications before your next appointment, please call your pharmacy*   Lab Work: - Your physician recommends that you have lab work today: CMET/ CBC/ TSH  If you have labs (blood work) drawn today and your tests are completely normal, you will receive your results only by: Marland Kitchen MyChart Message (if you have MyChart) OR . A paper copy in the mail If you have any lab test that is abnormal or we need to change your treatment, we will call you to review the results.   Testing/Procedures: - none ordered   Follow-Up: At [facility], you and your health needs are our priority.  As part of our continuing mission to provide you with exceptional heart care, we have created designated Provider Care Teams.  These Care Teams include your primary Cardiologist (physician) and Advanced Practice Providers (APPs -  Physician Assistants and Nurse Practitioners) who all work together to provide you with the care you need, when you need it.  We recommend signing up for the patient portal called "MyChart".  Sign up information is provided on this After Visit Summary.  MyChart is used to connect with patients for Virtual Visits (Telemedicine).  Patients are able to view lab/test results, encounter notes, upcoming appointments, etc.  Non-urgent messages can be sent to your provider as well.   To learn more about what you can do with MyChart, go to [email or web address].    Your next appointment:   1  year(s)  The format for your next appointment:   In Person  Provider:   Virl Axe, MD   Other Instructions n/a

## 2021-02-18 NOTE — Progress Notes (Signed)
Patient Care Team: Cletis Athens, MD as PCP - General (Cardiology)   HPI  Kimberly Hood is a 87 y.o. female Seen in followup for pacemaker (St Jude  His Bundle)  Implanted (GT)  for syncope with bifascicular block (5/18) Prior hx of reported AFib with CVA on warfarin.  At the last visit I thought we had discontinued her ACE inhibitor and discontinued amiodarone but elected to keep her on anticoagulation with her prior history of stroke.  She is taking now Eliquis and is without bleeding.  It turns out that these have been continued in her pill packs.  The patient denies chest pain, shortness of breath, nocturnal dyspnea, orthopnea or peripheral edema.  There have been no palpitations or syncope.  She does however have orthostatic lightheadedness with fear of falling she walks by herself to the dining room once or twice a day and does physical therapy with balance twice a week   5/18 Echo EF 60-65%  AS grad mild 17 mm  Date Cr K TSH LFT-AST Hgb WBC  5/18 1.19 4.6   13.9   6/19    2.69  34  9.3  6/21 1.71  1.71 (T3 49 ( nl > 76-181)  13.8 13.3                Past Medical History:  Diagnosis Date  . Heart attack ([place])   . Hypertension   . MI (myocardial infarction) ([place]) 2009  . Presence of permanent cardiac pacemaker 02/24/2017  . Stroke [facility])     Past Surgical History:  Procedure Laterality Date  . ABDOMINAL HYSTERECTOMY    . APPENDECTOMY    . PACEMAKER IMPLANT N/A 02/25/2017   Procedure: Pacemaker Implant;  Surgeon: Evans Lance, MD;  Location: [place] [facility];  Service: Cardiovascular;  Laterality: N/A;  . TONSILLECTOMY      Current Outpatient Medications  Medication Sig Dispense Refill  . amiodarone (PACERONE) 200 MG tablet TAKE 1/2 TABLET EVERYDAY 15 tablet 6  . apixaban (ELIQUIS) 2.5 MG TABS tablet Take 1 tablet (2.5 mg total) by mouth 2 (two) times daily. 60 tablet 11  . donepezil (ARICEPT) 10 MG tablet Take 10 mg by mouth daily.    Marland Kitchen  ezetimibe (ZETIA) 10 MG tablet TAKE 1 TABLET BY MOUTH DAILY 30 tablet 6  . levothyroxine (SYNTHROID) 25 MCG tablet TAKE ONE TABLET BY MOUTH EVERY DAY BEFORE BREAKFAST 30 tablet 6  . lisinopril (ZESTRIL) 40 MG tablet TAKE ONE-HALF TABLET BY MOUTH EVERY DAY 30 tablet 6  . potassium chloride SA (KLOR-CON) 20 MEQ tablet TAKE ONE TABLET BY MOUTH EVERY DAY 30 tablet 6  . simvastatin (ZOCOR) 40 MG tablet TAKE ONE TABLET AT BEDTIME 30 tablet 6   No current facility-administered medications for this visit.    No Known Allergies    Review of Systems negative except from HPI and PMH  Physical Exam BP 92/62   Pulse 60   Ht 4\' 11"  (1.499 m)   Wt 100 lb (45.4 kg)   BMI 20.20 kg/m  Thin and cachectic woman in no acute distress Well developed and well nourished in no acute distress HENT normal Neck supple with JVP-flat Clear Device pocket well healed; without hematoma or erythema.  There is no tethering  Regular rate and rhythm, no  murmur Abd-soft with active BS No Clubbing cyanosis  edema Skin-warm and dry A & Oriented  Grossly normal sensory and motor function  ECG atrial paced at  66 interval 23/08/42 Assessment and  Plan  Sinus bradycardia  Syncope   Pacemaker St Jude   Bifascicular block right bundle branch left posterior fascicular block  Atrial fibrillation   Stroke  High Risk Medication Surveillance -Amiodarone   Aortic stenosis  Hypothyroidism     Renal insufficiency grade 3  Hypotension  No intercurrent atrial fibrillation or flutter--As such, and given the potential toxicities we will discontinue amiodarone  On Anticoagulant for thromboembolic risk reduction.  No bleeding issues.  Continue Eliquis at 2.5 twice daily this at least in part explains her feelings of cold.  We will check her CBC  Lightheadedness in the context of her hypotension is concerning.  We will discontinue her lisinopril.  There has been interval worsening of her creatinine.  We  will check it again today.  Appropriate to maintain her simvastatin in the context of her prior stroke and will continue.  However, there are not data of which I am aware for the use of adjunctive acetamide so we will discontinue     Current medicines are reviewed at length with the patient today .  The patient does not  have concerns regarding medicines.

## 2021-02-19 LAB — COMPREHENSIVE METABOLIC PANEL
ALT: 15 IU/L (ref 0–32)
AST: 20 IU/L (ref 0–40)
Albumin/Globulin Ratio: 2.5 — ABNORMAL HIGH (ref 1.2–2.2)
Albumin: 4.3 g/dL (ref 3.5–4.6)
Alkaline Phosphatase: 90 IU/L (ref 44–121)
BUN/Creatinine Ratio: 21 (ref 12–28)
BUN: 28 mg/dL (ref 10–36)
Bilirubin Total: 0.5 mg/dL (ref 0.0–1.2)
CO2: 21 mmol/L (ref 20–29)
Calcium: 9.3 mg/dL (ref 8.7–10.3)
Chloride: 106 mmol/L (ref 96–106)
Creatinine, Ser: 1.33 mg/dL — ABNORMAL HIGH (ref 0.57–1.00)
Globulin, Total: 1.7 g/dL (ref 1.5–4.5)
Glucose: 100 mg/dL — ABNORMAL HIGH (ref 65–99)
Potassium: 5 mmol/L (ref 3.5–5.2)
Sodium: 141 mmol/L (ref 134–144)
Total Protein: 6 g/dL (ref 6.0–8.5)
eGFR: 38 mL/min/{1.73_m2} — ABNORMAL LOW (ref >59)

## 2021-02-19 LAB — CBC WITH DIFFERENTIAL/PLATELET
Basophils Absolute: 0.1 10*3/uL (ref 0.0–0.2)
Basos: 1 %
EOS (ABSOLUTE): 0.1 10*3/uL (ref 0.0–0.4)
Eos: 1 %
Hematocrit: 39 % (ref 34.0–46.6)
Hemoglobin: 12.9 g/dL (ref 11.1–15.9)
Immature Grans (Abs): 0 10*3/uL (ref 0.0–0.1)
Immature Granulocytes: 0 %
Lymphocytes Absolute: 1.6 10*3/uL (ref 0.7–3.1)
Lymphs: 18 %
MCH: 32.6 pg (ref 26.6–33.0)
MCHC: 33.1 g/dL (ref 31.5–35.7)
MCV: 99 fL — ABNORMAL HIGH (ref 79–97)
Monocytes Absolute: 0.7 10*3/uL (ref 0.1–0.9)
Monocytes: 8 %
Neutrophils Absolute: 6.3 10*3/uL (ref 1.4–7.0)
Neutrophils: 72 %
Platelets: 181 10*3/uL (ref 150–450)
RBC: 3.96 x10E6/uL (ref 3.77–5.28)
RDW: 12.5 % (ref 11.7–15.4)
WBC: 8.8 10*3/uL (ref 3.4–10.8)

## 2021-02-19 LAB — TSH: TSH: 1.93 u[IU]/mL (ref 0.450–4.500)

## 2021-03-04 ENCOUNTER — Ambulatory Visit (INDEPENDENT_AMBULATORY_CARE_PROVIDER_SITE_OTHER): Payer: Medicare Other

## 2021-03-04 DIAGNOSIS — I441 Atrioventricular block, second degree: Secondary | ICD-10-CM | POA: Diagnosis not present

## 2021-03-05 LAB — CUP PACEART REMOTE DEVICE CHECK
Battery Remaining Longevity: 117 mo
Battery Remaining Percentage: 95.5 %
Battery Voltage: 3.04 V
Brady Statistic AP VP Percent: 1 %
Brady Statistic AP VS Percent: 86 %
Brady Statistic AS VP Percent: 1 %
Brady Statistic AS VS Percent: 14 %
Brady Statistic RA Percent Paced: 85 %
Brady Statistic RV Percent Paced: 1 %
Date Time Interrogation Session: 20220531020017
Implantable Lead Implant Date: 20180524
Implantable Lead Implant Date: 20180524
Implantable Lead Location: 753859
Implantable Lead Location: 753860
Implantable Lead Model: 3830
Implantable Pulse Generator Implant Date: 20180524
Lead Channel Impedance Value: 460 Ohm
Lead Channel Impedance Value: 510 Ohm
Lead Channel Pacing Threshold Amplitude: 0.5 V
Lead Channel Pacing Threshold Amplitude: 0.75 V
Lead Channel Pacing Threshold Pulse Width: 0.5 ms
Lead Channel Pacing Threshold Pulse Width: 0.5 ms
Lead Channel Sensing Intrinsic Amplitude: 11.4 mV
Lead Channel Sensing Intrinsic Amplitude: 2.4 mV
Lead Channel Setting Pacing Amplitude: 2 V
Lead Channel Setting Pacing Amplitude: 2.5 V
Lead Channel Setting Pacing Pulse Width: 0.5 ms
Lead Channel Setting Sensing Sensitivity: 2 mV
Pulse Gen Model: 2272
Pulse Gen Serial Number: 8911338

## 2021-03-20 ENCOUNTER — Telehealth: Payer: Self-pay | Admitting: Internal Medicine

## 2021-03-20 DIAGNOSIS — R413 Other amnesia: Secondary | ICD-10-CM | POA: Diagnosis not present

## 2021-03-20 DIAGNOSIS — F0390 Unspecified dementia without behavioral disturbance: Secondary | ICD-10-CM | POA: Diagnosis not present

## 2021-03-20 NOTE — Telephone Encounter (Signed)
I spoke with the patient's daughter in law [name]- no DPR on file but she was present at the time of her office visit on 02/18/21 with Dr. Caryl Comes.  At the time of her visit, the patient was having issues with dizziness/ hypotension. Her BP in the office that day was 92/62.  Dr. Caryl Comes advised at that time he was going to:  1) STOP amiodarone 2) STOP zetia (ezetimibe) 3) STOP lisinopril  Per Kimberly Hood, the patient had an appointment with neurology today and her BP was 175/76 with an automatic cuff & then 162/72 with a manuel cuff. I inquired if the patient checks her BP at home. Per Kimberly Hood, the patient is in independent living, but does not check her BP at home. [name] advised she can call the nurses station tomorrow and check to see if the patient can have her BP checked there. I advised that we would like to have ~4-5 days of BP readings to see if the patient is consistently elevated now.  Today's BP reading was the first one she has had since seeing Dr. Caryl Comes. Kimberly Hood was afraid the reading was too high. I advised we would prefer for the SBP to not consistently be 162, but would rather her run a little higher than too low in order to decrease her risk for falls.  [name] voices understanding will speak with the patient's residence tomorrow and call [place] back with 4-5 days of BP readings when available.

## 2021-03-20 NOTE — Telephone Encounter (Signed)
Pt c/o BP issue: STAT if pt c/o blurred vision, one-sided weakness or slurred speech  1. What are your last 5 BP readings? 162/72 at neuro   2. Are you having any other symptoms (ex. Dizziness, headache, blurred vision, passed out)? No   3. What is your BP issue? Elevated at neuro visit today and concerned about being high after recently changing meds   Please call DIL [name] to discuss.

## 2021-03-26 NOTE — Progress Notes (Signed)
Remote pacemaker transmission.   

## 2021-03-27 ENCOUNTER — Telehealth: Payer: Self-pay | Admitting: Internal Medicine

## 2021-03-27 MED ORDER — LISINOPRIL 2.5 MG PO TABS: 2.5000 mg | ORAL_TABLET | Freq: Every day | ORAL | 3 refills | Status: DC

## 2021-03-27 NOTE — Telephone Encounter (Signed)
BP log   Mon 178/49  Tues 155/68   Wed  177/49  Thurs 157/72   Please call to advise on POC

## 2021-03-27 NOTE — Telephone Encounter (Signed)
I called and spoke with [name] (DIL). I have advised her of Thurmond Butts, PA's recommendations to: 1) restart lisinopril at 2.5 mg once daily 2) allow BP's to run sort of 202-542 range systolic  [name] is aware if the BP readings are still running >170, then we may need to increase her lisinopril to 5 mg once daily. I have asked [name] to have the patient get her BP checked once daily for 2 weeks, and then call with BP readings. Per Sharl Ma, the patient is in independent living and she has to call and remind her to go have this done. I have advised if a daily BP check is too much for her, then to please have it checked at least 3-4x/ week.  [name] advised she was a little concerned Dr. Caryl Comes stopped the patient's medications and was not going to follow up with her for a year. I have advised Sharl Ma, that I am unsure why Dr. Caryl Comes would push her follow up out so far, but if her BP's are no better in 2 weeks, then we can look to bring her in to discuss further.  [name] voices understanding and is agreeable.

## 2021-03-27 NOTE — Telephone Encounter (Signed)
With history of orthostasis she will need some degree of permissive hypertension.  With the discontinuation of lisinopril 20 mg daily she has been having BP readings in the 338V to 291B systolic.  In an effort to minimize the 166M systolic, please have the patient start lisinopril 2.5 mg daily, if needed could potentially go up to 5 mg daily to start.  Permissive blood pressure should likely be in the 140s to 150s, possibly 600K mmHg systolic.

## 2021-03-27 NOTE — Telephone Encounter (Signed)
Patient seen on 02/18/21 with Dr. Caryl Comes. BP was 92/62 at that time. Patient was advised by Dr. Caryl Comes at that visit to:  1) STOP amiodarone 2) STOP zetia (ezetimibe) 3) STOP lisinopril  Her daughter in law called on 03/20/21 stating the patient's BP was 175/76 (automatic cuff) & 162/72 (manuel cuff) at her neurology appointment that same day.  They had not checked the patient's BP between her 02/18/21 office visit with Dr. Caryl Comes & her 03/20/21 office visit with neurology.  I had asked the patient's daughter in law to please have the patient get her BP checked where she lives in [place] x 4-5 days and call back with the readings.  Kimberly Hood (DIL) has called today with those updated readings as stated below.  Dr. Caryl Comes is currently out of town.  Will forward to our in office PA to review/ advise in his absence.

## 2021-03-28 ENCOUNTER — Telehealth: Payer: Self-pay | Admitting: Physician Assistant

## 2021-03-28 ENCOUNTER — Other Ambulatory Visit: Payer: Self-pay

## 2021-03-28 ENCOUNTER — Inpatient Hospital Stay
Admission: EM | Admit: 2021-03-28 | Discharge: 2021-03-31 | DRG: 281 | Disposition: A | Discharge status: Skilled Nursing Facility | Payer: Medicare Other | Attending: Internal Medicine | Admitting: Internal Medicine

## 2021-03-28 ENCOUNTER — Observation Stay (HOSPITAL_COMMUNITY)
Admit: 2021-03-28 | Discharge: 2021-03-28 | Disposition: A | Discharge status: Home / Self Care | Payer: Medicare Other | Attending: Internal Medicine | Admitting: Internal Medicine

## 2021-03-28 ENCOUNTER — Emergency Department: Payer: Medicare Other

## 2021-03-28 DIAGNOSIS — Z90710 Acquired absence of both cervix and uterus: Secondary | ICD-10-CM

## 2021-03-28 DIAGNOSIS — F05 Delirium due to known physiological condition: Secondary | ICD-10-CM | POA: Diagnosis not present

## 2021-03-28 DIAGNOSIS — I350 Nonrheumatic aortic (valve) stenosis: Secondary | ICD-10-CM

## 2021-03-28 DIAGNOSIS — Z950 Presence of cardiac pacemaker: Secondary | ICD-10-CM

## 2021-03-28 DIAGNOSIS — I10 Essential (primary) hypertension: Secondary | ICD-10-CM | POA: Diagnosis not present

## 2021-03-28 DIAGNOSIS — Z79890 Hormone replacement therapy (postmenopausal): Secondary | ICD-10-CM

## 2021-03-28 DIAGNOSIS — Z79899 Other long term (current) drug therapy: Secondary | ICD-10-CM

## 2021-03-28 DIAGNOSIS — I340 Nonrheumatic mitral (valve) insufficiency: Secondary | ICD-10-CM

## 2021-03-28 DIAGNOSIS — I452 Bifascicular block: Secondary | ICD-10-CM | POA: Diagnosis present

## 2021-03-28 DIAGNOSIS — Z7901 Long term (current) use of anticoagulants: Secondary | ICD-10-CM

## 2021-03-28 DIAGNOSIS — I4891 Unspecified atrial fibrillation: Secondary | ICD-10-CM

## 2021-03-28 DIAGNOSIS — F0390 Unspecified dementia without behavioral disturbance: Secondary | ICD-10-CM | POA: Diagnosis present

## 2021-03-28 DIAGNOSIS — I480 Paroxysmal atrial fibrillation: Secondary | ICD-10-CM | POA: Diagnosis not present

## 2021-03-28 DIAGNOSIS — Z8249 Family history of ischemic heart disease and other diseases of the circulatory system: Secondary | ICD-10-CM

## 2021-03-28 DIAGNOSIS — I21A1 Myocardial infarction type 2: Secondary | ICD-10-CM | POA: Diagnosis present

## 2021-03-28 DIAGNOSIS — I441 Atrioventricular block, second degree: Secondary | ICD-10-CM | POA: Diagnosis present

## 2021-03-28 DIAGNOSIS — E785 Hyperlipidemia, unspecified: Secondary | ICD-10-CM | POA: Diagnosis present

## 2021-03-28 DIAGNOSIS — R7989 Other specified abnormal findings of blood chemistry: Secondary | ICD-10-CM | POA: Diagnosis not present

## 2021-03-28 DIAGNOSIS — R0789 Other chest pain: Secondary | ICD-10-CM | POA: Diagnosis not present

## 2021-03-28 DIAGNOSIS — R55 Syncope and collapse: Secondary | ICD-10-CM

## 2021-03-28 DIAGNOSIS — I959 Hypotension, unspecified: Secondary | ICD-10-CM | POA: Diagnosis not present

## 2021-03-28 DIAGNOSIS — I214 Non-ST elevation (NSTEMI) myocardial infarction: Secondary | ICD-10-CM | POA: Insufficient documentation

## 2021-03-28 DIAGNOSIS — E039 Hypothyroidism, unspecified: Secondary | ICD-10-CM | POA: Diagnosis present

## 2021-03-28 DIAGNOSIS — R079 Chest pain, unspecified: Secondary | ICD-10-CM | POA: Diagnosis not present

## 2021-03-28 DIAGNOSIS — J42 Unspecified chronic bronchitis: Secondary | ICD-10-CM | POA: Diagnosis present

## 2021-03-28 DIAGNOSIS — Z20822 Contact with and (suspected) exposure to covid-19: Secondary | ICD-10-CM | POA: Diagnosis not present

## 2021-03-28 DIAGNOSIS — I700 Atherosclerosis of aorta: Secondary | ICD-10-CM | POA: Diagnosis not present

## 2021-03-28 DIAGNOSIS — R778 Other specified abnormalities of plasma proteins: Secondary | ICD-10-CM

## 2021-03-28 DIAGNOSIS — N390 Urinary tract infection, site not specified: Secondary | ICD-10-CM | POA: Diagnosis present

## 2021-03-28 DIAGNOSIS — R918 Other nonspecific abnormal finding of lung field: Secondary | ICD-10-CM | POA: Diagnosis not present

## 2021-03-28 DIAGNOSIS — R001 Bradycardia, unspecified: Secondary | ICD-10-CM | POA: Diagnosis present

## 2021-03-28 DIAGNOSIS — J189 Pneumonia, unspecified organism: Secondary | ICD-10-CM

## 2021-03-28 DIAGNOSIS — I252 Old myocardial infarction: Secondary | ICD-10-CM

## 2021-03-28 DIAGNOSIS — Z8673 Personal history of transient ischemic attack (TIA), and cerebral infarction without residual deficits: Secondary | ICD-10-CM

## 2021-03-28 DIAGNOSIS — I248 Other forms of acute ischemic heart disease: Secondary | ICD-10-CM | POA: Diagnosis not present

## 2021-03-28 LAB — TROPONIN I (HIGH SENSITIVITY)
Troponin I (High Sensitivity): 116 ng/L (ref <18)
Troponin I (High Sensitivity): 252 ng/L (ref <18)
Troponin I (High Sensitivity): 333 ng/L (ref <18)
Troponin I (High Sensitivity): 282 ng/L (ref <18)

## 2021-03-28 LAB — COMPREHENSIVE METABOLIC PANEL
ALT: 13 U/L (ref 0–44)
AST: 19 U/L (ref 15–41)
Albumin: 3.5 g/dL (ref 3.5–5.0)
Alkaline Phosphatase: 102 U/L (ref 38–126)
Anion gap: 8 (ref 5–15)
BUN: 24 mg/dL — ABNORMAL HIGH (ref 8–23)
CO2: 27 mmol/L (ref 22–32)
Calcium: 9.1 mg/dL (ref 8.9–10.3)
Chloride: 104 mmol/L (ref 98–111)
Creatinine, Ser: 1.16 mg/dL — ABNORMAL HIGH (ref 0.44–1.00)
GFR, Estimated: 44 mL/min — ABNORMAL LOW (ref >60)
Glucose, Bld: 127 mg/dL — ABNORMAL HIGH (ref 70–99)
Potassium: 4.8 mmol/L (ref 3.5–5.1)
Sodium: 139 mmol/L (ref 135–145)
Total Bilirubin: 0.8 mg/dL (ref 0.3–1.2)
Total Protein: 6.8 g/dL (ref 6.5–8.1)

## 2021-03-28 LAB — APTT
aPTT: 30 seconds (ref 24–36)
aPTT: 53 seconds — ABNORMAL HIGH (ref 24–36)
aPTT: 45 seconds — ABNORMAL HIGH (ref 24–36)

## 2021-03-28 LAB — ECHOCARDIOGRAM COMPLETE
AR max vel: 0.92 cm2
AV Area VTI: 1.32 cm2
AV Area mean vel: 1 cm2
AV Mean grad: 16 mmHg
AV Peak grad: 24 mmHg
Ao pk vel: 2.45 m/s
Area-P 1/2: 2.63 cm2
Height: 59 in
S' Lateral: 2.15 cm
Weight: 1600 oz

## 2021-03-28 LAB — CBC WITH DIFFERENTIAL/PLATELET
Abs Immature Granulocytes: 0.07 10*3/uL (ref 0.00–0.07)
Basophils Absolute: 0.1 10*3/uL (ref 0.0–0.1)
Basophils Relative: 1 %
Eosinophils Absolute: 0.2 10*3/uL (ref 0.0–0.5)
Eosinophils Relative: 1 %
HCT: 37.7 % (ref 36.0–46.0)
Hemoglobin: 12.8 g/dL (ref 12.0–15.0)
Immature Granulocytes: 1 %
Lymphocytes Relative: 13 %
Lymphs Abs: 1.6 10*3/uL (ref 0.7–4.0)
MCH: 33.3 pg (ref 26.0–34.0)
MCHC: 34 g/dL (ref 30.0–36.0)
MCV: 98.2 fL (ref 80.0–100.0)
Monocytes Absolute: 1 10*3/uL (ref 0.1–1.0)
Monocytes Relative: 8 %
Neutro Abs: 9.8 10*3/uL — ABNORMAL HIGH (ref 1.7–7.7)
Neutrophils Relative %: 76 %
Platelets: 175 10*3/uL (ref 150–400)
RBC: 3.84 MIL/uL — ABNORMAL LOW (ref 3.87–5.11)
RDW: 15.1 % (ref 11.5–15.5)
WBC: 12.8 10*3/uL — ABNORMAL HIGH (ref 4.0–10.5)
nRBC: 0 % (ref 0.0–0.2)

## 2021-03-28 LAB — PROTIME-INR
INR: 1.2 (ref 0.8–1.2)
Prothrombin Time: 15.6 seconds — ABNORMAL HIGH (ref 11.4–15.2)

## 2021-03-28 LAB — HEPARIN LEVEL (UNFRACTIONATED): Heparin Unfractionated: >1.1 IU/mL — ABNORMAL HIGH (ref 0.30–0.70)

## 2021-03-28 MED ORDER — ACETAMINOPHEN 650 MG RE SUPP: 650.0000 mg | Freq: Four times a day (QID) | RECTAL | Status: AC | PRN

## 2021-03-28 MED ORDER — HYDRALAZINE HCL 20 MG/ML IJ SOLN
5.0000 mg | Freq: Four times a day (QID) | INTRAMUSCULAR | Status: DC | PRN
  Administered 2021-03-28: 5 mg via INTRAVENOUS
  Filled 2021-03-28: qty 1

## 2021-03-28 MED ORDER — SIMVASTATIN 20 MG PO TABS
40.0000 mg | ORAL_TABLET | Freq: Every day | ORAL | Status: DC
  Administered 2021-03-28 – 2021-03-30 (×2): 40 mg via ORAL
  Filled 2021-03-28 (×2): qty 2

## 2021-03-28 MED ORDER — LISINOPRIL 5 MG PO TABS: 2.5000 mg | ORAL_TABLET | Freq: Every day | ORAL | Status: DC

## 2021-03-28 MED ORDER — ONDANSETRON HCL 4 MG PO TABS: 4.0000 mg | ORAL_TABLET | Freq: Four times a day (QID) | ORAL | Status: DC | PRN

## 2021-03-28 MED ORDER — ONDANSETRON HCL 4 MG/2ML IJ SOLN: 4.0000 mg | Freq: Four times a day (QID) | INTRAMUSCULAR | Status: DC | PRN

## 2021-03-28 MED ORDER — HEPARIN (PORCINE) 25000 UT/250ML-% IV SOLN
550.0000 [IU]/h | INTRAVENOUS | Status: DC
  Administered 2021-03-28: 550 [IU]/h via INTRAVENOUS
  Filled 2021-03-28: qty 250

## 2021-03-28 MED ORDER — METOPROLOL TARTRATE 25 MG PO TABS
25.0000 mg | ORAL_TABLET | Freq: Two times a day (BID) | ORAL | Status: DC
  Administered 2021-03-28: 25 mg via ORAL
  Filled 2021-03-28 (×2): qty 1

## 2021-03-28 MED ORDER — VITAMIN D 25 MCG (1000 UNIT) PO TABS
1000.0000 [IU] | ORAL_TABLET | Freq: Every day | ORAL | Status: DC
  Administered 2021-03-28 – 2021-03-30 (×2): 1000 [IU] via ORAL
  Filled 2021-03-28 (×2): qty 1

## 2021-03-28 MED ORDER — LEVOTHYROXINE SODIUM 25 MCG PO TABS
25.0000 ug | ORAL_TABLET | Freq: Every day | ORAL | Status: DC
  Administered 2021-03-29 – 2021-03-31 (×3): 25 ug via ORAL
  Filled 2021-03-28 (×3): qty 1

## 2021-03-28 MED ORDER — DONEPEZIL HCL 5 MG PO TABS
10.0000 mg | ORAL_TABLET | Freq: Every day | ORAL | Status: DC
  Administered 2021-03-28 – 2021-03-30 (×2): 10 mg via ORAL
  Filled 2021-03-28 (×4): qty 2

## 2021-03-28 MED ORDER — SODIUM CHLORIDE 0.9 % IV SOLN: Freq: Once | INTRAVENOUS | Status: AC

## 2021-03-28 MED ORDER — LISINOPRIL 5 MG PO TABS
2.5000 mg | ORAL_TABLET | Freq: Every day | ORAL | Status: DC
  Administered 2021-03-28: 2.5 mg via ORAL
  Filled 2021-03-28: qty 1

## 2021-03-28 MED ORDER — HEPARIN BOLUS VIA INFUSION
1350.0000 [IU] | Freq: Once | INTRAVENOUS | Status: DC
  Filled 2021-03-28: qty 1350

## 2021-03-28 MED ORDER — ACETAMINOPHEN 325 MG PO TABS: 650.0000 mg | ORAL_TABLET | Freq: Four times a day (QID) | ORAL | Status: AC | PRN

## 2021-03-28 NOTE — Progress Notes (Signed)
*  PRELIMINARY RESULTS* Echocardiogram 2D Echocardiogram has been performed.  Sherrie Sport 03/28/2021, 1:11 PM

## 2021-03-28 NOTE — Consult Note (Deleted)
ANTICOAGULATION CONSULT NOTE - Initial Consult  Pharmacy Consult for Heparin Infusion Indication: chest pain/ACS  No Known Allergies  Patient Measurements: Height: 4\' 9"  (144.8 cm) Weight: 44.9 kg (99 lb) IBW/kg (Calculated) : 38.6 Heparin Dosing Weight: 45.4 kg  Vital Signs: Temp: 97.5 F (36.4 C) (06/24 1815) Temp Source: Oral (06/24 1815) BP: 114/45 (06/24 1815) Pulse Rate: 65 (06/24 1815)  Labs: Recent Labs    03/28/21 0833 03/28/21 1041 03/28/21 1221 03/28/21 1442 03/28/21 1443 03/28/21 2103  HGB 12.8  --   --   --   --   --   HCT 37.7  --   --   --   --   --   PLT 175  --   --   --   --   --   APTT  --   --  30  --  53* 45*  LABPROT  --   --  15.6*  --   --   --   INR  --   --  1.2  --   --   --   HEPARINUNFRC  --   --  >1.10*  --   --   --   CREATININE 1.16*  --   --   --   --   --   TROPONINIHS 116* 252*  --  333*  --   --      Estimated Creatinine Clearance: 18.5 mL/min (A) (by C-G formula based on SCr of 1.16 mg/dL (H)).   Medical History: Past Medical History:  Diagnosis Date   Heart attack ([place])    Hypertension    MI (myocardial infarction) ([place]) 2009   Presence of permanent cardiac pacemaker 02/24/2017   Stroke [facility])     Medications:  Scheduled:   cholecalciferol  1,000 Units Oral QHS   donepezil  10 mg Oral QHS   [START ON 03/29/2021] levothyroxine  25 mcg Oral Q0600   metoprolol tartrate  25 mg Oral BID   simvastatin  40 mg Oral QHS   Infusions:   heparin 550 Units/hr (03/28/21 1318)   PRN: acetaminophen **OR** acetaminophen, hydrALAZINE, ondansetron **OR** ondansetron (ZOFRAN) IV Anti-infectives (From admission, onward)    None       Assessment: Pharmacy has been consulted to initiate heparin infusion in 87yo female with history of Atrial fibrillation(taking apixaban PTA) and coronary disease with pacemaker. Presented to ED with chest pain that started around midnight on 03/28/21. Has troponin levels of 116 and 252. Patient's  last Apixaban dose was yesterday, 03/27/21@2000 . Baseline labs: HL>1.10, aPTT 30sec, INR 1.2, Plts 175, Hgb 12.8  Baseline labs: HL>1.10, aPTT 30sec,  6/24 2103 aPTT = 45s    Goal of Therapy:  Heparin level 0.3-0.7 units/ml aPTT 66-102 seconds Monitor platelets by anticoagulation protocol: Yes   Plan:  Heparin subtherapeutic with aPTT lower than baseline level, confirmed with nurse no interruptions in therapy Will order heparin bolus of 1350 units x 1 and increase infusion to 700 units/hr Check aPTT level in 8 hours and HL daily until both levels correlate, then will transition to HL dosing. Continue to monitor H&H and platelets  Dorothe Pea, PharmD, BCPS  03/28/2021,9:39 PM

## 2021-03-28 NOTE — ED Notes (Signed)
Pharm confirms no bolus needed on Heparin gtt as pt took her eliquis last night. See MAR messages.

## 2021-03-28 NOTE — ED Notes (Signed)
Results of interrogation per [place] staff: 150-160 Afib RVR for about 4 hours and ended around 6:30am. Attending provider and cardiology notified via secure chat.

## 2021-03-28 NOTE — Telephone Encounter (Signed)
Spoke with [place] over at [employer] and they did receive transmission. Provided him with our fax number and requested they fax over to [place].

## 2021-03-28 NOTE — ED Notes (Signed)
Pt given food tray as Ends from cards verbal okay for pt to eat today.

## 2021-03-28 NOTE — ED Notes (Signed)
Pt received her dinner tray from dietary staff.

## 2021-03-28 NOTE — ED Notes (Signed)
Attending [facility] A notified via secure chat of pt's HTN.

## 2021-03-28 NOTE — Consult Note (Signed)
ANTICOAGULATION CONSULT NOTE - Initial Consult  Pharmacy Consult for Heparin Infusion Indication: chest pain/ACS  No Known Allergies  Patient Measurements: Height: 4\' 9"  (144.8 cm) Weight: 44.9 kg (99 lb) IBW/kg (Calculated) : 38.6 Heparin Dosing Weight: 45.4 kg  Vital Signs: Temp: 97.5 F (36.4 C) (06/24 1815) Temp Source: Oral (06/24 1815) BP: 114/45 (06/24 1815) Pulse Rate: 65 (06/24 1815)  Labs: Recent Labs    03/28/21 0833 03/28/21 1041 03/28/21 1221 03/28/21 1442 03/28/21 1443 03/28/21 2103  HGB 12.8  --   --   --   --   --   HCT 37.7  --   --   --   --   --   PLT 175  --   --   --   --   --   APTT  --   --  30  --  53* 45*  LABPROT  --   --  15.6*  --   --   --   INR  --   --  1.2  --   --   --   HEPARINUNFRC  --   --  >1.10*  --   --   --   CREATININE 1.16*  --   --   --   --   --   TROPONINIHS 116* 252*  --  333*  --   --      Estimated Creatinine Clearance: 18.5 mL/min (A) (by C-G formula based on SCr of 1.16 mg/dL (H)).   Medical History: Past Medical History:  Diagnosis Date   Heart attack ([place])    Hypertension    MI (myocardial infarction) ([place]) 2009   Presence of permanent cardiac pacemaker 02/24/2017   Stroke [facility])     Medications:  Scheduled:   cholecalciferol  1,000 Units Oral QHS   donepezil  10 mg Oral QHS   [START ON 03/29/2021] levothyroxine  25 mcg Oral Q0600   metoprolol tartrate  25 mg Oral BID   simvastatin  40 mg Oral QHS   Infusions:   heparin 550 Units/hr (03/28/21 1318)   PRN: acetaminophen **OR** acetaminophen, hydrALAZINE, ondansetron **OR** ondansetron (ZOFRAN) IV Anti-infectives (From admission, onward)    None       Assessment: Pharmacy has been consulted to initiate heparin infusion in 87yo female with history of Atrial fibrillation(taking apixaban PTA) and coronary disease with pacemaker. Presented to ED with chest pain that started around midnight on 03/28/21. Has troponin levels of 116 and 252. Patient's  last Apixaban dose was yesterday, 03/27/21@2000 . Baseline labs: HL>1.10, aPTT 30sec, INR 1.2, Plts 175, Hgb 12.8  Baseline labs: HL>1.10, aPTT 30sec,  6/24 1443 aPTT = 53s (only 1.5 hr on infusion) 6/24 2103 aPTT = 45s    Goal of Therapy:  Heparin level 0.3-0.7 units/ml aPTT 66-102 seconds Monitor platelets by anticoagulation protocol: Yes   Plan:  Heparin subtherapeutic with aPTT lower than baseline level, confirmed with nurse patient pulled out IV access -- unknown duration.  Resume heparin at previous rate once IV access regained.  Check aPTT level in 8 hours following resuming and HL daily until both levels correlate, then will transition to HL dosing. Continue to monitor H&H and platelets  Dorothe Pea, PharmD, BCPS  03/28/2021,10:21 PM

## 2021-03-28 NOTE — Telephone Encounter (Signed)
Kimberly Hood, the patient's DIL, called the office today stating the patient called her daughter complaining of chest pain this morning. Sharl Ma is currently out of town, but the patient was taken to [facility]. They were called by the ER MD and told her troponin was elevated and they questioned the family about the plans for the patient's course of care. The patient is requesting to go home, but the ER MD has recommended another troponin, heparin, & ? Cath.  I have advised Kimberly Hood that I cannot advise regarding what they should decide regarding the patient's care. She is asking if the patient should be on heparin for 2 days, then go home or what would be best.  I have advised Kimberly Hood to wait for the 2nd troponin to result to see which way this is trending and then if the MD calls them back, to ask them the questions they are asking me, but to also request a cardiology consult prior to discharge if this has not already been done.  Kimberly Hood voices understanding and is agreeable.

## 2021-03-28 NOTE — ED Provider Notes (Signed)
[facility] Emergency Department Provider Note  ____________________________________________   Event Date/Time   First MD Initiated Contact with Patient 03/28/21 [phone]     (approximate)  I have reviewed the triage vital signs and the nursing notes.   HISTORY  Chief Complaint Chest Pain    HPI Kimberly Hood is a 87 y.o. female with hypertension, coronary disease with pacemaker, paroxysmal A. fib on Eliquis who comes in for chest pain.  Patient reports having chest pain that started around midnight.  She states that she was already awake and started to develop the pain.  She describes it as a pressure in the center of her chest, constant, moderate, nothing made it better, nothing made it worse.  She states that it went away around 4 AM.  She lives in a facility and had called her daughter stating that she wanted to get a follow-up appointment with her cardiologist when her daughter asked her why she found this out and they were instructed to come to the emergency room by the facility.  She denies any shortness of breath, chest pain.  Had COVID about 1 month ago.  Is been compliant with her Eliquis.  Feels at her baseline self at this time          Past Medical History:  Diagnosis Date   Heart attack [facility])    Hypertension    MI (myocardial infarction) ([place]) 2009   Presence of permanent cardiac pacemaker 02/24/2017   Stroke [facility])     Patient Active Problem List   Diagnosis Date Noted   Full incontinence of feces 01/09/2021   Primary hypertension 10/10/2020   Annual physical exam 08/01/2020   Decreased hearing of both ears 08/01/2020   At moderate risk for fall 08/01/2020   Need for influenza vaccination 08/01/2020   PAF (paroxysmal atrial fibrillation) ([place]) 03/15/2020   Acquired hypothyroidism 03/15/2020   Memory change 03/15/2020   Seasonal allergic rhinitis due to pollen 03/15/2020   Rotator cuff arthropathy of right shoulder 03/15/2020    Mobitz type 2 second degree atrioventricular block 02/25/2017    Past Surgical History:  Procedure Laterality Date   ABDOMINAL HYSTERECTOMY     APPENDECTOMY     PACEMAKER IMPLANT N/A 02/25/2017   Procedure: Pacemaker Implant;  Surgeon: Evans Lance, MD;  Location: [place] [facility] LAB;  Service: Cardiovascular;  Laterality: N/A;   TONSILLECTOMY      Prior to Admission medications   Medication Sig Start Date End Date Taking? Authorizing Provider  apixaban (ELIQUIS) 2.5 MG TABS tablet Take 1 tablet (2.5 mg total) by mouth 2 (two) times daily. 11/11/20   Deboraha Sprang, MD  donepezil (ARICEPT) 10 MG tablet Take 10 mg by mouth daily. 10/15/20   [provider]  levothyroxine (SYNTHROID) 25 MCG tablet TAKE ONE TABLET BY MOUTH EVERY DAY BEFORE BREAKFAST 02/13/21   Cletis Athens, MD  lisinopril (ZESTRIL) 2.5 MG tablet Take 1 tablet (2.5 mg total) by mouth daily. 03/27/21 06/25/21  Deboraha Sprang, MD  potassium chloride SA (KLOR-CON) 20 MEQ tablet TAKE ONE TABLET BY MOUTH EVERY DAY 12/17/20   Cletis Athens, MD  simvastatin (ZOCOR) 40 MG tablet TAKE ONE TABLET AT BEDTIME 01/16/21   Cletis Athens, MD    Allergies Patient has no known allergies.  Family History  Problem Relation Age of Onset   Diabetes Mother    Heart attack Mother    Heart attack Father    Brain cancer Sister  Hypertension Sister     Social History Social History   Tobacco Use   Smoking status: Never   Smokeless tobacco: Never  Vaping Use   Vaping Use: Never used  Substance Use Topics   Alcohol use: No   Drug use: No      Review of Systems Constitutional: No fever/chills Eyes: No visual changes. ENT: No sore throat. Cardiovascular: Positive chest pain Respiratory: Denies shortness of breath. Gastrointestinal: No abdominal pain.  No nausea, no vomiting.  No diarrhea.  No constipation. Genitourinary: Negative for dysuria. Musculoskeletal: Negative for back pain. Skin: Negative for  rash. Neurological: Negative for headaches, focal weakness or numbness. All other ROS negative ____________________________________________   PHYSICAL EXAM:  VITAL SIGNS: ED Triage Vitals  Enc Vitals Group     BP --      Pulse Rate 03/28/21 0821 62     Resp 03/28/21 0821 15     Temp 03/28/21 0821 (!) 97 F (36.1 C)     Temp Source 03/28/21 0821 Axillary     SpO2 03/28/21 0821 100 %     Weight 03/28/21 0826 100 lb (45.4 kg)     Height 03/28/21 0826 4\' 11"  (1.499 m)     Head Circumference --      Peak Flow --      Pain Score 03/28/21 0823 0     Pain Loc --      Pain Edu? --      Excl. in [place]? --     Constitutional: Alert and oriented. Well appearing and in no acute distress. Eyes: Conjunctivae are normal. EOMI. Head: Atraumatic. Nose: No congestion/rhinnorhea. Mouth/Throat: Mucous membranes are moist.   Neck: No stridor. Trachea Midline. FROM Cardiovascular: Normal rate, regular rhythm. Grossly normal heart sounds.  Good peripheral circulation.  Pacemaker palpated on the left chest Respiratory: Normal respiratory effort.  No retractions. Lungs CTAB. Gastrointestinal: Soft and nontender. No distention. No abdominal bruits.  Musculoskeletal: No lower extremity tenderness nor edema.  No joint effusions. Neurologic:  Normal speech and language. No gross focal neurologic deficits are appreciated.  Skin:  Skin is warm, dry and intact. No rash noted. Psychiatric: Mood and affect are normal. Speech and behavior are normal. GU: Deferred   ____________________________________________   LABS (all labs ordered are listed, but only abnormal results are displayed)  Labs Reviewed  CBC WITH DIFFERENTIAL/PLATELET - Abnormal; Notable for the following components:      Result Value   WBC 12.8 (*)    RBC 3.84 (*)    Neutro Abs 9.8 (*)    All other components within normal limits  COMPREHENSIVE METABOLIC PANEL - Abnormal; Notable for the following components:   Glucose, Bld 127 (*)     BUN 24 (*)    Creatinine, Ser 1.16 (*)    GFR, Estimated 44 (*)    All other components within normal limits  TROPONIN I (HIGH SENSITIVITY) - Abnormal; Notable for the following components:   Troponin I (High Sensitivity) 116 (*)    All other components within normal limits   ____________________________________________   ED ECG REPORT I, Vanessa Ashford, the attending physician, personally viewed and interpreted this ECG.  Normal sinus rate 61, no ST elevation, no T wave versions, normal intervals ____________________________________________  RADIOLOGY Robert Bellow, personally viewed and evaluated these images (plain radiographs) as part of my medical decision making, as well as reviewing the written report by the radiologist.  ED MD interpretation: Some patchiness bilaterally  Official radiology  report(s): DG Chest 2 View  Result Date: 03/28/2021 CLINICAL DATA:  87 year old female with history of chest pain. EXAM: CHEST - 2 VIEW COMPARISON:  Chest x-ray 05/23/2018. FINDINGS: Scattered areas of interstitial prominence and peribronchial cuffing are again noted throughout the lungs bilaterally, similar to prior studies. No acute consolidative airspace disease. No pleural effusions. No pneumothorax. No definite suspicious appearing pulmonary nodules or masses are noted. No evidence of pulmonary edema. Heart size is normal. Upper mediastinal contours are within normal limits. Atherosclerotic calcifications in the thoracic aorta. Severe calcifications of the mitral annulus. Left-sided pacemaker device in place with lead tips projecting over the expected location of the right atrium and right ventricle. IMPRESSION: 1. Chronic changes of interstitial prominence and peribronchial cuffing suggesting probable chronic bronchitis. The possibility of interstitial lung disease is not entirely excluded. If there is clinical concern for interstitial lung disease, further evaluation with high-resolution  chest CT could be considered. 2. Aortic atherosclerosis. Electronically Signed   By: Vinnie Langton M.D.   On: 03/28/2021 09:25    ____________________________________________   PROCEDURES  Procedure(s) performed (including Critical Care):  Procedures   ____________________________________________   INITIAL IMPRESSION / ASSESSMENT AND PLAN / ED COURSE   Kimberly Hood was evaluated in Emergency Department on 03/28/2021 for the symptoms described in the history of present illness. She was evaluated in the context of the global COVID-19 pandemic, which necessitated consideration that the patient might be at risk for infection with the SARS-CoV-2 virus that causes COVID-19. Institutional protocols and algorithms that pertain to the evaluation of patients at risk for COVID-19 are in a state of rapid change based on information released by regulatory bodies including the CDC and federal and state organizations. These policies and algorithms were followed during the patient's care in the ED.    Most Likely DDx:  -ACS given her age   DDx that was also considered d/t potential to cause harm, but was found less likely based on history and physical (as detailed above): -PNA (no fevers, cough but CXR to evaluate) -PNX (reassured with equal b/l breath sounds, CXR to evaluate) -Symptomatic anemia (will get H&H) -Pulmonary embolism as no sob at rest, not pleuritic in nature, no hypoxia and patient is on Eliquis -Aortic Dissection as no tearing pain and no radiation to the mid back, pulses equal -Pericarditis no rub on exam, EKG changes or hx to suggest dx -Tamponade (no notable SOB, tachycardic, hypotensive) -Esophageal rupture (no h/o diffuse vomitting/no crepitus)  Initial cardiac marker is elevated into the 100s.  She is chest pain-free at this time.  I did message the cardiology team to let them know.  We will get patient admitted to the hospital for potential NSTEMI and further  evaluation with echo, repeat troponins.  Her chest x-ray was concerning for some chronic changes that could be seen with chronic bronchitis versus interstitial lung disease.  Patient denies any shortness of breath and is not hypoxic therefore I think he can hold off on CT scan at this time.  Suspect more likely chronic.  Will discuss hospital team for admission       ____________________________________________   FINAL CLINICAL IMPRESSION(S) / ED DIAGNOSES   Final diagnoses:  Chest pain, unspecified type  Elevated troponin     MEDICATIONS GIVEN DURING THIS VISIT:  Medications - No data to display   ED Discharge Orders     None        Note:  This document was prepared using Dragon voice recognition  software and may include unintentional dictation errors.    Vanessa Togiak, MD 03/28/21 [phone]

## 2021-03-28 NOTE — ED Notes (Signed)
This NT assisted pt to ambulate to toilet and back. Family member in room.

## 2021-03-28 NOTE — Telephone Encounter (Signed)
Report received and placed on your desk for review.

## 2021-03-28 NOTE — ED Notes (Signed)
MD Jari Pigg notified about troponin 116

## 2021-03-28 NOTE — Consult Note (Signed)
Cardiology Consultation:   Patient ID: Kimberly Hood; [id number]; May 01, 1926   Admit date: 03/28/2021 Date of Consult: 03/28/2021  Primary Care Provider: Cletis Athens, MD Primary Cardiologist: Kimberly Hood Electrophysiologist:  Kimberly Hood   Patient Profile:   Kimberly Hood is a 87 y.o. female with a hx of symptomatic bradycardia with bifascicular block and syncope status post [employer] PPM implanted in 02/2017, A. fib on Eliquis, systolic dysfunction with prior EF of 40 to 45% by echo in 2013, CVA, CKD stage III, hypothyroidism, aortic stenosis, mitral regurgitation, orthostatic hypotension who is being seen today for the evaluation of chest pain with elevated troponin at the request of Kimberly Hood.  History of Present Illness:   Kimberly Hood has been followed by Dr. Caryl Hood for symptomatic bradycardia with bifascicular block and syncope status post A2 medical PPM in 2018.  Prior notes from 7169 described "embolic phenomena and STEMI" with further details being unclear.  No prior ischemic evaluation available for review.  Most recent echo from 02/2017 showed an EF of 60 to 65%, no regional wall motion abnormalities, grade 1 diastolic dysfunction, mild aortic stenosis with a mean gradient of 17 mmHg, moderate mitral vegetation, moderately dilated left atrium, normal RV systolic function and PASP.  She was last seen in the office in 02/2021 and was doing well from a cardiac perspective.  Due to orthostasis, lisinopril was discontinued.  With this, her BP became elevated into the 678L systolic.  Given this, it was recommended she start lower dose lisinopril 2.5 mg on 6/23.  While laying in bed this morning she developed sudden onset of substernal chest pressure with associated shortness of breath.  Symptoms lasted for 4 hours followed by spontaneous resolution without intervention.  She has never had symptoms like this before.  No associated palpitations, dizziness, presyncope, or  syncope.  No lower extremity swelling orthopnea, falls, hematochezia, or melena.  Due to symptoms, she presented to [facility] where she was found to have stable vital signs with BP elevated into the 150s to 180s at times.  EKG demonstrated NSR, 61 bpm, possible prior anterior infarct, no acute ST-T changes.  Chest x-ray consistent with chronic bronchitis with possible interstitial lung disease as well as aortic atherosclerosis.  Initial high-sensitivity troponin 116 with a delta troponin of 252 currently trending to 333.  Potassium 4.8, BUN 24, serum creatinine 1.16, AST/ALT normal, albumin 3.5, WBC 12.8, Hgb 12.8, PLT 175.  In the ED she received PTA lisinopril 2.5 mg and was started on a heparin drip.  Since arrival she has been asymptomatic.  Device interrogation upon cardiology consult demonstrated a 4-hour episode of A. fib with RVR this morning that coincided with the timing of the patient's symptoms with ventricular rates into the 150s to 160s bpm that spontaneously converted to sinus rhythm.    Past Medical History:  Diagnosis Date   Heart attack [facility])    Hypertension    MI (myocardial infarction) ([place]) 2009   Presence of permanent cardiac pacemaker 02/24/2017   Stroke [facility])     Past Surgical History:  Procedure Laterality Date   ABDOMINAL HYSTERECTOMY     APPENDECTOMY     PACEMAKER IMPLANT N/A 02/25/2017   Procedure: Pacemaker Implant;  Surgeon: Evans Lance, MD;  Location: [place] CV LAB;  Service: Cardiovascular;  Laterality: N/A;   TONSILLECTOMY       Home Meds: Prior to Admission medications   Medication Sig Start Date End Date Taking? Authorizing Provider  apixaban (ELIQUIS) 2.5 MG TABS tablet Take 1 tablet (2.5 mg total) by mouth 2 (two) times daily. 11/11/20  Yes Deboraha Sprang, MD  cholecalciferol (VITAMIN D3) 25 MCG (1000 UNIT) tablet Take 1,000 Units by mouth at bedtime.   Yes [provider]  donepezil (ARICEPT) 10 MG tablet Take 10 mg by mouth at bedtime.    Yes [provider]  levothyroxine (SYNTHROID) 25 MCG tablet TAKE ONE TABLET BY MOUTH EVERY DAY BEFORE BREAKFAST Patient taking differently: Take 25 mcg by mouth daily before breakfast. 02/13/21  Yes Masoud, Viann Shove, MD  lisinopril (ZESTRIL) 2.5 MG tablet Take 1 tablet (2.5 mg total) by mouth daily. Patient taking differently: Take 2.5 mg by mouth at bedtime. 03/27/21 06/25/21 Yes Deboraha Sprang, MD  potassium chloride SA (KLOR-CON) 20 MEQ tablet TAKE ONE TABLET BY MOUTH EVERY DAY Patient taking differently: Take 20 mEq by mouth daily. 12/17/20  Yes Masoud, Viann Shove, MD  simvastatin (ZOCOR) 40 MG tablet TAKE ONE TABLET AT BEDTIME Patient taking differently: Take 40 mg by mouth at bedtime. 01/16/21  Yes Kimberly Athens, MD    Inpatient Medications: Scheduled Meds:  cholecalciferol  1,000 Units Oral QHS   donepezil  10 mg Oral QHS   [START ON 03/29/2021] levothyroxine  25 mcg Oral Q0600   lisinopril  2.5 mg Oral QHS   simvastatin  40 mg Oral QHS   Continuous Infusions:  heparin     PRN Meds: acetaminophen **OR** acetaminophen, ondansetron **OR** ondansetron (ZOFRAN) IV  Allergies:  No Known Allergies  Social History:   Social History   Socioeconomic History   Marital status: Widowed    Spouse name: Not on file   Number of children: Not on file   Years of education: Not on file   Highest education level: Not on file  Occupational History   Not on file  Tobacco Use   Smoking status: Never   Smokeless tobacco: Never  Vaping Use   Vaping Use: Never used  Substance and Sexual Activity   Alcohol use: No   Drug use: No   Sexual activity: Not on file  Other Topics Concern   Not on file  Social History Narrative   Not on file   Social Determinants of Health   Financial Resource Strain: Not on file  Food Insecurity: Not on file  Transportation Needs: Not on file  Physical Activity: Not on file  Stress: Not on file  Social Connections: Not on file  Intimate Partner  Violence: Not on file     Family History:   Family History  Problem Relation Age of Onset   Diabetes Mother    Heart attack Mother    Heart attack Father    Brain cancer Sister    Hypertension Sister     ROS:  Review of Systems  Constitutional:  Positive for malaise/fatigue. Negative for chills, diaphoresis, fever and weight loss.  HENT:  Negative for congestion.   Eyes:  Negative for discharge and redness.  Respiratory:  Negative for cough, sputum production, shortness of breath and wheezing.   Cardiovascular:  Positive for chest pain. Negative for palpitations, orthopnea, claudication, leg swelling and PND.  Gastrointestinal:  Negative for abdominal pain, heartburn, nausea and vomiting.  Musculoskeletal:  Negative for falls and myalgias.  Skin:  Negative for rash.  Neurological:  Positive for weakness. Negative for dizziness, tingling, tremors, sensory change, speech change, focal weakness and loss of consciousness.  Endo/Heme/Allergies:  Does not bruise/bleed easily.  Psychiatric/Behavioral:  Negative  for substance abuse. The patient is not nervous/anxious.   All other systems reviewed and are negative.    Physical Exam/Data:   Vitals:   03/28/21 1030 03/28/21 1100 03/28/21 1130 03/28/21 1218  BP: (!) 161/57 (!) 170/65 (!) 175/61 (!) 181/56  Pulse: (!) 59 (!) 59 60   Resp: 16 18 17    Temp:      TempSrc:      SpO2: 97% 98% 98%   Weight:      Height:       No intake or output data in the 24 hours ending 03/28/21 1303 Filed Weights   03/28/21 0826  Weight: 45.4 kg   Body mass index is 20.2 kg/m.   Physical Exam: General: Frail and elderly appearing, in no acute distress. Head: Normocephalic, atraumatic, sclera non-icteric, no xanthomas, nares without discharge.  Neck: Negative for carotid bruits. JVD not elevated. Lungs: Clear bilaterally to auscultation without wheezes, rales, or rhonchi. Breathing is unlabored. Heart: RRR with S1 S2. II/VI systolic murmur heard  throughout, no rubs, or gallops appreciated. Abdomen: Soft, non-tender, non-distended with normoactive bowel sounds. No hepatomegaly. No rebound/guarding. No obvious abdominal masses. Msk:  Strength and tone appear normal for age. Extremities: No clubbing or cyanosis. No edema. Distal pedal pulses are 2+ and equal bilaterally. Neuro: Alert and oriented X 3. No facial asymmetry. No focal deficit. Moves all extremities spontaneously. Psych:  Responds to questions appropriately with a normal affect.   EKG:  The EKG was personally reviewed and demonstrates: NSR, 61 bpm, possible prior anterior infarct, no acute ST-T changes Telemetry:  Telemetry was personally reviewed and demonstrates: SR  Weights: [employer]   03/28/21 0826  Weight: 45.4 kg    Relevant CV Studies:  2D echo 03/28/2021: Pending __________  2D echo 02/2017: - Left ventricle: The cavity size was normal. Systolic function was    normal. The estimated ejection fraction was in the range of 60%    to 65%. Wall motion was normal; there were no regional wall    motion abnormalities. Doppler parameters are consistent with    abnormal left ventricular relaxation (grade 1 diastolic    dysfunction).  - Aortic valve: Transvalvular velocity was increased. There was    mild stenosis. Mean velocity (S): 188 cm/s. Mean gradient (S): 17    mm Hg.  - Mitral valve: Calcified annulus. There was moderate    regurgitation.  - Left atrium: The atrium was moderately dilated.  - Right ventricle: Systolic function was normal.  - Pulmonary arteries: Systolic pressure was within the normal    range.   Laboratory Data:  Chemistry Recent Labs  Lab 03/28/21 0833  NA 139  K 4.8  CL 104  CO2 27  GLUCOSE 127*  BUN 24*  CREATININE 1.16*  CALCIUM 9.1  GFRNONAA 44*  ANIONGAP 8    Recent Labs  Lab 03/28/21 0833  PROT 6.8  ALBUMIN 3.5  AST 19  ALT 13  ALKPHOS 102  BILITOT 0.8   Hematology Recent Labs  Lab 03/28/21 0833   WBC 12.8*  RBC 3.84*  HGB 12.8  HCT 37.7  MCV 98.2  MCH 33.3  MCHC 34.0  RDW 15.1  PLT 175   Cardiac EnzymesNo results for input(s): TROPONINI in the last 168 hours. No results for input(s): TROPIPOC in the last 168 hours.  BNPNo results for input(s): BNP, PROBNP in the last 168 hours.  DDimer No results for input(s): DDIMER in the last 168 hours.  Radiology/Studies:  [employer]  Chest 2 View  Result Date: 03/28/2021 IMPRESSION: 1. Chronic changes of interstitial prominence and peribronchial cuffing suggesting probable chronic bronchitis. The possibility of interstitial lung disease is not entirely excluded. If there is clinical concern for interstitial lung disease, further evaluation with high-resolution chest CT could be considered. 2. Aortic atherosclerosis. Electronically Signed   By: Vinnie Langton M.D.   On: 03/28/2021 09:25    Assessment and Plan:   1. Demand ischemia: -Currently chest pain free -Patient with 4 hours of chest pain this morning leading to her presentation, device interrogation today showed she had a 4 hour episode of Afib with RVR with ventricular rates in the 150s to 160s bpm with timing coinciding with her symptoms -Suspect her symptoms are related to A fib with RVR -Elevated troponin is consistent with demand ischemia in the setting of Afib with RVR -HS-Tn has trended to 252, continue to cycle to peak -Agree with heparin gtt for now -Check echo -Options of management were explained to her in detail, she prefers conservative management at this time with plans to reassess this as her workup continues with echo and device interrogation   2. PAF: -Maintaining sinus rhythm -Device interrogation today showed she had a 4 hour episode of Afib with RVR with ventricular rates in the 150s to 160s bpm this morning -Stop lisinopril  -Start Lopressor 25 mg bid -Eliquis on hold until we are certain inpatient invasive procedures will not be undertaken -Heparin gtt in place  of Eliquis for now, prior to discharge, recommend resuming [place] -CHADS2VASc at least 7 (HTN, age x 2, CVA x 2, vascular disease, sex category)  3. Syncope with bifascicular block and symptomatic bradycardia: -Status post SJM PPM -Device interrogated in the ED as above  4. Aortic stenosis/mitral regurgitation: -Echo pending  5. HTN with history of orthostasis: -Hold lisinopril  -Start Lopressor as above  6.  Systolic dysfunction: -Most recent EF preserved by echo in 2018 -Echo this admission pending  -She appears euvolemic and well compensated -Metoprolol as above   For questions or updates, please contact [place] Please consult [email or web address] for contact info under Cardiology/STEMI.   Signed, Christell Faith, PA-C [place] Pager: [phone] 03/28/2021, 1:03 PM

## 2021-03-28 NOTE — Consult Note (Signed)
ANTICOAGULATION CONSULT NOTE - Initial Consult  Pharmacy Consult for Heparin Infusion Indication: chest pain/ACS  No Known Allergies  Patient Measurements: Height: 4\' 11"  (149.9 cm) Weight: 45.4 kg (100 lb) IBW/kg (Calculated) : 43.2 Heparin Dosing Weight: 45.4 kg  Vital Signs: Temp: 97 F (36.1 C) (06/24 0821) Temp Source: Axillary (06/24 0821) BP: 148/50 (06/24 1300) Pulse Rate: 59 (06/24 1300)  Labs: Recent Labs    03/28/21 0833 03/28/21 1041  HGB 12.8  --   HCT 37.7  --   PLT 175  --   CREATININE 1.16*  --   TROPONINIHS 116* 252*    Estimated Creatinine Clearance: 20.7 mL/min (A) (by C-G formula based on SCr of 1.16 mg/dL (H)).   Medical History: Past Medical History:  Diagnosis Date   Heart attack ([place])    Hypertension    MI (myocardial infarction) ([place]) 2009   Presence of permanent cardiac pacemaker 02/24/2017   Stroke [facility])     Medications:  Scheduled:   cholecalciferol  1,000 Units Oral QHS   donepezil  10 mg Oral QHS   [START ON 03/29/2021] levothyroxine  25 mcg Oral Q0600   lisinopril  2.5 mg Oral QHS   simvastatin  40 mg Oral QHS   Infusions:   heparin 550 Units/hr (03/28/21 1318)   PRN: acetaminophen **OR** acetaminophen, ondansetron **OR** ondansetron (ZOFRAN) IV Anti-infectives (From admission, onward)    None       Assessment: Pharmacy has been consulted to initiate heparin infusion in 87yo female with history of Atrial fibrillation(taking apixaban PTA) and coronary disease with pacemaker. Presented to ED with chest pain that started around midnight on 03/28/21. Has troponin levels of 116 and 252. Patient's last Apixaban dose was yesterday, 03/27/21@2000 . Baseline labs: HL>1.10, aPTT 30sec, INR 1.2, Plts 175, Hgb 12.8  Goal of Therapy:  Heparin level 0.3-0.7 units/ml aPTT 66-102 seconds Monitor platelets by anticoagulation protocol: Yes   Plan:  Will avoid bolus since patient took last dose of Apixaban last night. Start heparin  infusion at 550 units/hr Check aPTT level in 8 hours and HL daily until both levels correlate, then will transition to HL dosing. Continue to monitor H&H and platelets  Maira Christon A Varnika Butz 03/28/2021,1:38 PM

## 2021-03-28 NOTE — Progress Notes (Signed)
Pt BP upon arrival to the floor 114/45. Recheck 111/44. MD notified and night nurse made aware. No new orders at this time. Daughter in law at bedside. Pt alert and oriented x 4. Pt continues on heparin drip.

## 2021-03-28 NOTE — Telephone Encounter (Signed)
Patient's daughter in law is calling stating patient is in the Emergency Department and needs to discuss some questions she is being asked. Please call to discuss.

## 2021-03-28 NOTE — Telephone Encounter (Signed)
Report reviewed in the ED.  Please see consult note.

## 2021-03-28 NOTE — H&P (Signed)
History and Physical   Kimberly Hood QZR:[id number] DOB: 1925-12-20 DOA: 03/28/2021  PCP: Cletis Athens, MD  Outpatient Specialists: Dr. Caryl Comes, [facility] Patient coming from: [facility], [place] [facility]  I have personally briefly reviewed patient's old medical records in [place].  Chief Concern: chest pain  HPI: Kimberly Hood is a 87 y.o. female with medical history significant for atrial fibrillation, hypertension, history of stroke, presence of permanent cardiac pacemaker, history of abdominal hysterectomy, presents to the emergency department from [facility] living facility for chief concerns of chest pain.  Chest pain approximately midnight, lasted approximately 4 hours. She reports that this woke her up from sleep.  At bedside she denies shortness of breath.  She denies fever, nausea, vomiting.  Her cardiology took her off of several medications.  No follow-up visit has been scheduled after the medications were discontinued.  However as patient's have been having elevated blood pressure since her medications were discontinued, low-dose lisinopril was resumed.  At bedside patient was able to tell me her full name, her age, identify her granddaughter at bedside, and she knows she is in the hospital.  Social history: From [facility].  She denies tobacco, EtOH, recreational drug use.  Vaccination history: Patient is vaccinated for COVID-19, 2 doses  ROS: Constitutional: no weight change, no fever ENT/Mouth: no sore throat, no rhinorrhea Eyes: no eye pain, no vision changes Cardiovascular: + chest pain, no dyspnea,  no edema, no palpitations Respiratory: no cough, no sputum, no wheezing Gastrointestinal: no nausea, no vomiting, no diarrhea, no constipation Genitourinary: no urinary incontinence, no dysuria, no hematuria Musculoskeletal: no arthralgias, no myalgias Skin: no skin lesions, no pruritus, Neuro:  + weakness, no loss of consciousness, no syncope Psych: no anxiety, no depression, + decrease appetite Heme/Lymph: no bruising, no bleeding  ED Course: Discussed with ED provider, patient requiring hospitalization for NSTEMI.  Vitals in the emergency department was remarkable for temperature 97.0, respiration rate of 16, heart rate 62, blood pressure 158/61, SPO2 100% on room air.  Labs in the emergency department was remarkable for elevated high-sensitivity troponin of 116, BUN 24, serum creatinine of 1.16, nonfasting blood glucose 127, WBC 12.8, hemoglobin 12.8, platelets 175, EGFR 44.  Assessment/Plan  Principal Problem:   NSTEMI (non-ST elevated myocardial infarction) ([place]) Active Problems:   Mobitz type 2 second degree atrioventricular block   PAF (paroxysmal atrial fibrillation) (HCC)   Acquired hypothyroidism   Primary hypertension   Chest pain   # NSTEMI - Second troponin was elevated at 252 -Extensive discussion with son and daughter-in-law over the phone and they would like cardiology evaluation - Cardiology consulted by myself via secure chat, appreciate further recommendation - Heparin GTT initiated - Complete echo ordered  # History of atrial fibrillation and CVA - Previously on warfarin - Previously on amiodarone 200 mg tablet, 1/2 tablet every day, - Patient is currently on Eliquis 2.5 mg twice daily - This is being held at this time as patient has been started on heparin gtt. - A.m. team to resume Eliquis if and when appropriate  # History of sinus bradycardia status post [place] pacemaker - Cardiac device interrogation - Outpatient follow-up  # History of hypertension-resumed home lisinopril  # Hyperlipidemia-simvastatin 40 mg nightly resumed  # Hypothyroid-levothyroxine 25 mcg q. a.m. resumed  # Dementia-donepezil 100 mg nightly resumed  Chart reviewed.   Outpatient cardiology visit on 02/18/2021: Eliquis 2.5 mg p.o. twice daily was resumed,  amiodarone was discontinued, Zetia was  discontinued, lisinopril was discontinued due to hypotension  DVT prophylaxis: Heparin GTT Code Status: Full code Diet: N.p.o. pending cardiology evaluation Family Communication: [name] and Elizette Shek Disposition Plan: Pending clinical course Consults called: Cardiology Admission status: Progressive cardiac, observation 24 hours telemetry  Past Medical History:  Diagnosis Date   Heart attack ([place])    Hypertension    MI (myocardial infarction) ([place]) 2009   Presence of permanent cardiac pacemaker 02/24/2017   Stroke [facility])    Past Surgical History:  Procedure Laterality Date   ABDOMINAL HYSTERECTOMY     APPENDECTOMY     PACEMAKER IMPLANT N/A 02/25/2017   Procedure: Pacemaker Implant;  Surgeon: Evans Lance, MD;  Location: [facility];  Service: Cardiovascular;  Laterality: N/A;   TONSILLECTOMY     Social History:  reports that she has never smoked. She has never used smokeless tobacco. She reports that she does not drink alcohol and does not use drugs.  No Known Allergies Family History  Problem Relation Age of Onset   Diabetes Mother    Heart attack Mother    Heart attack Father    Brain cancer Sister    Hypertension Sister    Family history: Family history reviewed and not pertinent  Prior to Admission medications   Medication Sig Start Date End Date Taking? Authorizing Provider  apixaban (ELIQUIS) 2.5 MG TABS tablet Take 1 tablet (2.5 mg total) by mouth 2 (two) times daily. 11/11/20   Deboraha Sprang, MD  donepezil (ARICEPT) 10 MG tablet Take 10 mg by mouth daily. 10/15/20   [provider]  levothyroxine (SYNTHROID) 25 MCG tablet TAKE ONE TABLET BY MOUTH EVERY DAY BEFORE BREAKFAST 02/13/21   Cletis Athens, MD  lisinopril (ZESTRIL) 2.5 MG tablet Take 1 tablet (2.5 mg total) by mouth daily. 03/27/21 06/25/21  Deboraha Sprang, MD  potassium chloride SA (KLOR-CON) 20 MEQ tablet TAKE ONE TABLET BY MOUTH EVERY DAY 12/17/20    Cletis Athens, MD  simvastatin (ZOCOR) 40 MG tablet TAKE ONE TABLET AT BEDTIME 01/16/21   Cletis Athens, MD   Physical Exam: Vitals:   03/28/21 0826 03/28/21 1000 03/28/21 1030 03/28/21 1100  BP:  (!) 152/58 (!) 161/57 (!) 170/65  Pulse:  60 (!) 59 (!) 59  Resp:  _0 Temp:      TempSrc:      SpO2:  97% 97% 98%  Weight: 45.4 kg     Height: 4' 11" (1.499 m)      Constitutional: appears age-appropriate, NAD, calm, comfortable Eyes: PERRL, lids and conjunctivae normal ENMT: Mucous membranes are moist. Posterior pharynx clear of any exudate or lesions. Age-appropriate dentition.  Mild hearing loss Neck: normal, supple, no masses, no thyromegaly Respiratory: clear to auscultation bilaterally, no wheezing, no crackles. Normal respiratory effort. No accessory muscle use.  Cardiovascular: Regular rate and rhythm. + no murmurs in the left lateral upper sternum border. No extremity edema. 2+ pedal pulses. No carotid bruits.  Abdomen: no tenderness, no masses palpated, no hepatosplenomegaly. Bowel sounds positive.  Musculoskeletal: no clubbing / cyanosis. No joint deformity upper and lower extremities. Good ROM, no contractures, no atrophy. Normal muscle tone.  Skin: no rashes, lesions, ulcers. No induration Neurologic: Sensation intact. Strength 5/5 in all 4.  Psychiatric: Normal judgment and insight. Alert and oriented x 3. Normal mood.   EKG: independently reviewed, Atrial paced/sinus rhythm with rate of 69, QTc 433  Chest x-ray on Admission: I personally reviewed and I agree with radiologist reading  as below.  DG Chest 2 View  Result Date: 03/28/2021 CLINICAL DATA:  87 year old female with history of chest pain. EXAM: CHEST - 2 VIEW COMPARISON:  Chest x-ray 05/23/2018. FINDINGS: Scattered areas of interstitial prominence and peribronchial cuffing are again noted throughout the lungs bilaterally, similar to prior studies. No acute consolidative airspace disease. No pleural effusions. No  pneumothorax. No definite suspicious appearing pulmonary nodules or masses are noted. No evidence of pulmonary edema. Heart size is normal. Upper mediastinal contours are within normal limits. Atherosclerotic calcifications in the thoracic aorta. Severe calcifications of the mitral annulus. Left-sided pacemaker device in place with lead tips projecting over the expected location of the right atrium and right ventricle. IMPRESSION: 1. Chronic changes of interstitial prominence and peribronchial cuffing suggesting probable chronic bronchitis. The possibility of interstitial lung disease is not entirely excluded. If there is clinical concern for interstitial lung disease, further evaluation with high-resolution chest CT could be considered. 2. Aortic atherosclerosis. Electronically Signed   By: Vinnie Langton M.D.   On: 03/28/2021 09:25    Labs on Admission: I have personally reviewed following labs  CBC: Recent Labs  Lab 03/28/21 0833  WBC 12.8*  NEUTROABS 9.8*  HGB 12.8  HCT 37.7  MCV 98.2  PLT 696   Basic Metabolic Panel: Recent Labs  Lab 03/28/21 0833  NA 139  K 4.8  CL 104  CO2 27  GLUCOSE 127*  BUN 24*  CREATININE 1.16*  CALCIUM 9.1   GFR: Estimated Creatinine Clearance: 20.7 mL/min (A) (by C-G formula based on SCr of 1.16 mg/dL (H)).  Liver Function Tests: Recent Labs  Lab 03/28/21 0833  AST 19  ALT 13  ALKPHOS 102  BILITOT 0.8  PROT 6.8  ALBUMIN 3.5   Urine analysis:    Component Value Date/Time   COLORURINE Yellow 08/23/2012 1336   APPEARANCEUR Cloudy 08/23/2012 1336   LABSPEC 1.017 08/23/2012 1336   PHURINE 8.0 08/23/2012 1336   GLUCOSEU Negative 08/23/2012 1336   HGBUR Negative 08/23/2012 1336   BILIRUBINUR Negative 08/23/2012 1336   KETONESUR Trace 08/23/2012 1336   PROTEINUR Negative 08/23/2012 1336   NITRITE Negative 08/23/2012 1336   LEUKOCYTESUR Trace 08/23/2012 1336   Dr. Tobie Poet [facility]  If 7PM-7AM, please contact overnight-coverage  provider If 7AM-7PM, please contact day coverage provider [email or web address]  03/28/2021, 11:39 AM

## 2021-03-28 NOTE — Telephone Encounter (Signed)
Called and spoke with Zack at [facility]. They have not received transmission and inquired if device was allowed to warm up for 2 minutes before trying to use. Then once the star lights up it is ready to do transmission. Sent secure message and text to provider with this information.

## 2021-03-28 NOTE — ED Triage Notes (Signed)
Pt arrives via [facility] with granddaughter at bedside. Pt lives at [facility] in independent living. Pt reported CP this am lasting from 12am-4am. Granddaughter reports pt has been having HTN for the last week and was put back on lisinopril yesterday (6/23). EMS VS: 126/62; HR 66; SpO2 98% RA. Pt denying CP at this time; VS WNL; pt in NAD at this time.

## 2021-03-28 NOTE — ED Notes (Signed)
Cardiologist at bedside. Heparin gtt started; no bolus currently listed for gtt; messaged pharm to notify/confirm.

## 2021-03-29 DIAGNOSIS — F0390 Unspecified dementia without behavioral disturbance: Secondary | ICD-10-CM | POA: Diagnosis present

## 2021-03-29 DIAGNOSIS — I517 Cardiomegaly: Secondary | ICD-10-CM | POA: Diagnosis not present

## 2021-03-29 DIAGNOSIS — M6281 Muscle weakness (generalized): Secondary | ICD-10-CM | POA: Diagnosis not present

## 2021-03-29 DIAGNOSIS — R001 Bradycardia, unspecified: Secondary | ICD-10-CM | POA: Diagnosis present

## 2021-03-29 DIAGNOSIS — I4891 Unspecified atrial fibrillation: Secondary | ICD-10-CM | POA: Diagnosis not present

## 2021-03-29 DIAGNOSIS — E785 Hyperlipidemia, unspecified: Secondary | ICD-10-CM | POA: Diagnosis present

## 2021-03-29 DIAGNOSIS — R2689 Other abnormalities of gait and mobility: Secondary | ICD-10-CM | POA: Diagnosis not present

## 2021-03-29 DIAGNOSIS — Z7901 Long term (current) use of anticoagulants: Secondary | ICD-10-CM | POA: Diagnosis not present

## 2021-03-29 DIAGNOSIS — I452 Bifascicular block: Secondary | ICD-10-CM | POA: Diagnosis present

## 2021-03-29 DIAGNOSIS — I441 Atrioventricular block, second degree: Secondary | ICD-10-CM | POA: Diagnosis present

## 2021-03-29 DIAGNOSIS — Z90710 Acquired absence of both cervix and uterus: Secondary | ICD-10-CM | POA: Diagnosis not present

## 2021-03-29 DIAGNOSIS — I252 Old myocardial infarction: Secondary | ICD-10-CM | POA: Diagnosis not present

## 2021-03-29 DIAGNOSIS — J42 Unspecified chronic bronchitis: Secondary | ICD-10-CM | POA: Diagnosis present

## 2021-03-29 DIAGNOSIS — I25119 Atherosclerotic heart disease of native coronary artery with unspecified angina pectoris: Secondary | ICD-10-CM | POA: Diagnosis not present

## 2021-03-29 DIAGNOSIS — I21A1 Myocardial infarction type 2: Secondary | ICD-10-CM | POA: Diagnosis present

## 2021-03-29 DIAGNOSIS — N390 Urinary tract infection, site not specified: Secondary | ICD-10-CM | POA: Diagnosis present

## 2021-03-29 DIAGNOSIS — Z20822 Contact with and (suspected) exposure to covid-19: Secondary | ICD-10-CM | POA: Diagnosis present

## 2021-03-29 DIAGNOSIS — J189 Pneumonia, unspecified organism: Secondary | ICD-10-CM | POA: Diagnosis not present

## 2021-03-29 DIAGNOSIS — Z950 Presence of cardiac pacemaker: Secondary | ICD-10-CM | POA: Diagnosis not present

## 2021-03-29 DIAGNOSIS — R778 Other specified abnormalities of plasma proteins: Secondary | ICD-10-CM | POA: Diagnosis not present

## 2021-03-29 DIAGNOSIS — Z8673 Personal history of transient ischemic attack (TIA), and cerebral infarction without residual deficits: Secondary | ICD-10-CM | POA: Diagnosis not present

## 2021-03-29 DIAGNOSIS — Z79899 Other long term (current) drug therapy: Secondary | ICD-10-CM | POA: Diagnosis not present

## 2021-03-29 DIAGNOSIS — I480 Paroxysmal atrial fibrillation: Secondary | ICD-10-CM | POA: Diagnosis present

## 2021-03-29 DIAGNOSIS — F05 Delirium due to known physiological condition: Secondary | ICD-10-CM | POA: Diagnosis present

## 2021-03-29 DIAGNOSIS — Z8249 Family history of ischemic heart disease and other diseases of the circulatory system: Secondary | ICD-10-CM | POA: Diagnosis not present

## 2021-03-29 DIAGNOSIS — I10 Essential (primary) hypertension: Secondary | ICD-10-CM | POA: Diagnosis present

## 2021-03-29 DIAGNOSIS — R1312 Dysphagia, oropharyngeal phase: Secondary | ICD-10-CM | POA: Diagnosis not present

## 2021-03-29 DIAGNOSIS — J9811 Atelectasis: Secondary | ICD-10-CM | POA: Diagnosis not present

## 2021-03-29 DIAGNOSIS — R262 Difficulty in walking, not elsewhere classified: Secondary | ICD-10-CM | POA: Diagnosis not present

## 2021-03-29 DIAGNOSIS — R0789 Other chest pain: Secondary | ICD-10-CM | POA: Diagnosis not present

## 2021-03-29 DIAGNOSIS — I248 Other forms of acute ischemic heart disease: Secondary | ICD-10-CM | POA: Diagnosis not present

## 2021-03-29 DIAGNOSIS — Z79890 Hormone replacement therapy (postmenopausal): Secondary | ICD-10-CM | POA: Diagnosis not present

## 2021-03-29 DIAGNOSIS — E039 Hypothyroidism, unspecified: Secondary | ICD-10-CM | POA: Diagnosis present

## 2021-03-29 LAB — BASIC METABOLIC PANEL
Anion gap: 8 (ref 5–15)
BUN: 26 mg/dL — ABNORMAL HIGH (ref 8–23)
CO2: 23 mmol/L (ref 22–32)
Calcium: 8.6 mg/dL — ABNORMAL LOW (ref 8.9–10.3)
Chloride: 106 mmol/L (ref 98–111)
Creatinine, Ser: 1.1 mg/dL — ABNORMAL HIGH (ref 0.44–1.00)
GFR, Estimated: 47 mL/min — ABNORMAL LOW (ref >60)
Glucose, Bld: 95 mg/dL (ref 70–99)
Potassium: 4.1 mmol/L (ref 3.5–5.1)
Sodium: 137 mmol/L (ref 135–145)

## 2021-03-29 LAB — CBC
HCT: 36.2 % (ref 36.0–46.0)
Hemoglobin: 11.9 g/dL — ABNORMAL LOW (ref 12.0–15.0)
MCH: 32.3 pg (ref 26.0–34.0)
MCHC: 32.9 g/dL (ref 30.0–36.0)
MCV: 98.4 fL (ref 80.0–100.0)
Platelets: 164 10*3/uL (ref 150–400)
RBC: 3.68 MIL/uL — ABNORMAL LOW (ref 3.87–5.11)
RDW: 14.8 % (ref 11.5–15.5)
WBC: 10.5 10*3/uL (ref 4.0–10.5)
nRBC: 0 % (ref 0.0–0.2)

## 2021-03-29 LAB — APTT: aPTT: 75 seconds — ABNORMAL HIGH (ref 24–36)

## 2021-03-29 LAB — TROPONIN I (HIGH SENSITIVITY): Troponin I (High Sensitivity): 245 ng/L (ref <18)

## 2021-03-29 LAB — HEPARIN LEVEL (UNFRACTIONATED): Heparin Unfractionated: >1.1 IU/mL — ABNORMAL HIGH (ref 0.30–0.70)

## 2021-03-29 LAB — SARS CORONAVIRUS 2 (TAT 6-24 HRS): SARS Coronavirus 2: NEGATIVE

## 2021-03-29 MED ORDER — APIXABAN 2.5 MG PO TABS
2.5000 mg | ORAL_TABLET | Freq: Two times a day (BID) | ORAL | Status: DC
  Administered 2021-03-29 – 2021-03-31 (×4): 2.5 mg via ORAL
  Filled 2021-03-29 (×4): qty 1

## 2021-03-29 MED ORDER — METOPROLOL TARTRATE 25 MG PO TABS
12.5000 mg | ORAL_TABLET | Freq: Two times a day (BID) | ORAL | Status: DC
  Administered 2021-03-30 – 2021-03-31 (×3): 12.5 mg via ORAL
  Filled 2021-03-29 (×3): qty 1

## 2021-03-29 MED ORDER — GUAIFENESIN-DM 100-10 MG/5ML PO SYRP
5.0000 mL | ORAL_SOLUTION | ORAL | Status: DC | PRN
  Administered 2021-03-29: 5 mL via ORAL
  Filled 2021-03-29: qty 5

## 2021-03-29 MED ORDER — AMLODIPINE BESYLATE 5 MG PO TABS
5.0000 mg | ORAL_TABLET | Freq: Every day | ORAL | Status: DC
  Administered 2021-03-29 – 2021-03-31 (×3): 5 mg via ORAL
  Filled 2021-03-29 (×3): qty 1

## 2021-03-29 MED ORDER — QUETIAPINE FUMARATE 25 MG PO TABS: 12.5000 mg | ORAL_TABLET | Freq: Every day | ORAL | Status: DC

## 2021-03-29 NOTE — Consult Note (Signed)
ANTICOAGULATION CONSULT NOTE  Pharmacy Consult for Heparin Infusion Indication: chest pain/ACS  No Known Allergies  Patient Measurements: Height: 4\' 9"  (144.8 cm) Weight: 44.9 kg (99 lb) IBW/kg (Calculated) : 38.6 Heparin Dosing Weight: 45.4 kg  Vital Signs: Temp: 98 F (36.7 C) (06/25 0730) Temp Source: Oral (06/25 0730) BP: 152/63 (06/25 0730) Pulse Rate: 63 (06/25 0730)  Labs: Recent Labs    03/28/21 0833 03/28/21 1041 03/28/21 1221 03/28/21 1221 03/28/21 1442 03/28/21 1443 03/28/21 2103 03/28/21 2221 03/29/21 0527 03/29/21 0657  HGB 12.8  --   --   --   --   --   --   --  11.9*  --   HCT 37.7  --   --   --   --   --   --   --  36.2  --   PLT 175  --   --   --   --   --   --   --  164  --   APTT  --   --  30   < >  --  53* 45*  --   --  75*  LABPROT  --   --  15.6*  --   --   --   --   --   --   --   INR  --   --  1.2  --   --   --   --   --   --   --   HEPARINUNFRC  --   --  >1.10*  --   --   --   --   --   --  >1.10*  CREATININE 1.16*  --   --   --   --   --   --   --  1.10*  --   TROPONINIHS 116* 252*  --   --  333*  --   --  282*  --   --    < > = values in this interval not displayed.     Estimated Creatinine Clearance: 19.5 mL/min (A) (by C-G formula based on SCr of 1.1 mg/dL (H)).   Medical History: Past Medical History:  Diagnosis Date   Heart attack ([place])    Hypertension    MI (myocardial infarction) ([place]) 2009   Presence of permanent cardiac pacemaker 02/24/2017   Stroke [facility])      Assessment: Pharmacy has been consulted to initiate heparin infusion in 87yo female with history of Atrial fibrillation(taking apixaban PTA) and coronary disease with pacemaker. Presented to ED with chest pain that started around midnight on 03/28/21. Has troponin levels of 116 and 252. Patient's last Apixaban dose was 03/27/21 at 2000.  6/24 1443 aPTT 53 s (only 1.5 hr on infusion) 6/24 2103 aPTT 45 s 6/25 0657 aPTT 75 s    Goal of Therapy:  Heparin level  0.3-0.7 units/ml aPTT 66-102 seconds Monitor platelets by anticoagulation protocol: Yes   Plan:  aPTT therapeutic x 1 Continue heparin drip at 550 units/hr Recheck aPTT at 1500 Continue to monitor H&H and platelets  Tawnya Crook, PharmD Clinical Pharmacist 03/29/2021,8:16 AM

## 2021-03-29 NOTE — Progress Notes (Signed)
Progress Note  Patient Name: Kimberly Hood Date of Encounter: 03/29/2021  [facility] Cardiologist: None Dr. Caryl Comes  Subjective   Denies chest pain or shortness of breath, working with physical therapy.  POA/daughter-in-law in room with patient.  Inpatient Medications    Scheduled Meds:  amLODipine  5 mg Oral Daily   apixaban  2.5 mg Oral BID   cholecalciferol  1,000 Units Oral QHS   donepezil  10 mg Oral QHS   levothyroxine  25 mcg Oral Q0600   metoprolol tartrate  12.5 mg Oral BID   simvastatin  40 mg Oral QHS   Continuous Infusions:  PRN Meds: acetaminophen **OR** acetaminophen, guaiFENesin-dextromethorphan, hydrALAZINE, ondansetron **OR** ondansetron (ZOFRAN) IV   Vital Signs    Vitals:   03/28/21 1815 03/29/21 0344 03/29/21 0730 03/29/21 1141  BP: (!) 114/45 (!) 135/53 (!) 152/63 (!) 141/58  Pulse: 65 (!) 59 63 (!) 59  Resp: 20 14 16 16   Temp: (!) 97.5 F (36.4 C) (!) 97.4 F (36.3 C) 98 F (36.7 C) 98.3 F (36.8 C)  TempSrc: Oral  Oral   SpO2: 100% 99% 97% 98%  Weight: 44.9 kg     Height: 4\' 9"  (1.448 m)      No intake or output data in the 24 hours ending 03/29/21 1407 Last 3 Weights 03/28/2021 03/28/2021 02/18/2021  Weight (lbs) 99 lb 100 lb 100 lb  Weight (kg) 44.906 kg 45.36 kg 45.36 kg      Telemetry    Atrial paced rhythm- Personally Reviewed  ECG     - Personally Reviewed  Physical Exam   GEN: No acute distress, appears frail Neck: No JVD Cardiac: RRR, 2/6 systolic murmur Respiratory: Clear to auscultation bilaterally. GI: Soft, nontender, non-distended  MS: No edema; No deformity. Neuro:  Nonfocal  Psych: Normal affect   Labs    High Sensitivity Troponin:   Recent Labs  Lab 03/28/21 0833 03/28/21 1041 03/28/21 1442 03/28/21 2221 03/29/21 0527  TROPONINIHS 116* 252* 333* 282* 245*      Chemistry Recent Labs  Lab 03/28/21 0833 03/29/21 0527  NA 139 137  K 4.8 4.1  CL 104 106  CO2 27 23  GLUCOSE 127* 95   BUN 24* 26*  CREATININE 1.16* 1.10*  CALCIUM 9.1 8.6*  PROT 6.8  --   ALBUMIN 3.5  --   AST 19  --   ALT 13  --   ALKPHOS 102  --   BILITOT 0.8  --   GFRNONAA 44* 47*  ANIONGAP 8 8     Hematology Recent Labs  Lab 03/28/21 0833 03/29/21 0527  WBC 12.8* 10.5  RBC 3.84* 3.68*  HGB 12.8 11.9*  HCT 37.7 36.2  MCV 98.2 98.4  MCH 33.3 32.3  MCHC 34.0 32.9  RDW 15.1 14.8  PLT 175 164    BNPNo results for input(s): BNP, PROBNP in the last 168 hours.   DDimer No results for input(s): DDIMER in the last 168 hours.   Radiology    DG Chest 2 View  Result Date: 03/28/2021 CLINICAL DATA:  87 year old female with history of chest pain. EXAM: CHEST - 2 VIEW COMPARISON:  Chest x-ray 05/23/2018. FINDINGS: Scattered areas of interstitial prominence and peribronchial cuffing are again noted throughout the lungs bilaterally, similar to prior studies. No acute consolidative airspace disease. No pleural effusions. No pneumothorax. No definite suspicious appearing pulmonary nodules or masses are noted. No evidence of pulmonary edema. Heart size is normal. Upper mediastinal contours are  within normal limits. Atherosclerotic calcifications in the thoracic aorta. Severe calcifications of the mitral annulus. Left-sided pacemaker device in place with lead tips projecting over the expected location of the right atrium and right ventricle. IMPRESSION: 1. Chronic changes of interstitial prominence and peribronchial cuffing suggesting probable chronic bronchitis. The possibility of interstitial lung disease is not entirely excluded. If there is clinical concern for interstitial lung disease, further evaluation with high-resolution chest CT could be considered. 2. Aortic atherosclerosis. Electronically Signed   By: Vinnie Langton M.D.   On: 03/28/2021 09:25   ECHOCARDIOGRAM COMPLETE  Result Date: 03/28/2021    ECHOCARDIOGRAM REPORT   Patient Name:   Kimberly Hood Date of Exam: 03/28/2021 Medical  Rec #:  [id number]              Height:       59.0 in Accession #:    [id number]             Weight:       100.0 lb Date of Birth:  19-Sep-1926              BSA:          1.374 m Patient Age:    87 years               BP:           181/56 mmHg Patient Gender: F                      HR:           60 bpm. Exam Location:  [facility] Procedure: 2D Echo, Cardiac Doppler and Color Doppler Indications:     Chest pain R07.9  History:         Patient has prior history of Echocardiogram examinations, most                  recent 02/19/2017. Previous Myocardial Infarction, Pacemaker;                  Risk Factors:Hypertension. Heart attack.  Sonographer:     Sherrie Sport RDCS (AE) Referring Phys:  [id number] AMY N [facility] Diagnosing Phys: Nelva Bush MD  Sonographer Comments: Image acquisition challenging due to patient body habitus. IMPRESSIONS  1. Left ventricular ejection fraction, by estimation, is 65 to 70%. The left ventricle has normal function. The left ventricle has no regional wall motion abnormalities. There is mild left ventricular hypertrophy. Left ventricular diastolic parameters are consistent with Grade I diastolic dysfunction (impaired relaxation). Elevated left atrial pressure.  2. Right ventricular systolic function is normal. The right ventricular size is normal. Tricuspid regurgitation signal is inadequate for assessing PA pressure.  3. Left atrial size was mildly dilated.  4. The mitral valve is degenerative. Moderate mitral valve regurgitation. No evidence of mitral stenosis. Severe mitral annular calcification.  5. The aortic valve has an indeterminant number of cusps. There is mild calcification of the aortic valve. There is moderate thickening of the aortic valve. Aortic valve regurgitation is trivial. Mild to moderate aortic valve stenosis. Aortic valve area, by VTI measures 1.32 cm. Aortic valve mean gradient measures 16.0 mmHg. FINDINGS  Left Ventricle: Left ventricular ejection fraction, by estimation, is 65  to 70%. The left ventricle has normal function. The left ventricle has no regional wall motion abnormalities. The left ventricular internal cavity size was normal in size. There is  mild left ventricular hypertrophy. Left ventricular diastolic parameters are consistent with  Grade I diastolic dysfunction (impaired relaxation). Elevated left atrial pressure. Right Ventricle: The right ventricular size is normal. Right vetricular wall thickness was not well visualized. Right ventricular systolic function is normal. Tricuspid regurgitation signal is inadequate for assessing PA pressure. Left Atrium: Left atrial size was mildly dilated. Right Atrium: Right atrial size was normal in size. Pericardium: The pericardium was not well visualized. Mitral Valve: The mitral valve is degenerative in appearance. There is mild thickening of the mitral valve leaflet(s). There is mild calcification of the mitral valve leaflet(s). Severe mitral annular calcification. Moderate mitral valve regurgitation. No evidence of mitral valve stenosis. Tricuspid Valve: The tricuspid valve is not well visualized. Tricuspid valve regurgitation is trivial. Aortic Valve: The aortic valve has an indeterminant number of cusps. There is mild calcification of the aortic valve. There is moderate thickening of the aortic valve. Aortic valve regurgitation is trivial. Mild to moderate aortic stenosis is present. Aortic valve mean gradient measures 16.0 mmHg. Aortic valve peak gradient measures 24.0 mmHg. Aortic valve area, by VTI measures 1.32 cm. Pulmonic Valve: The pulmonic valve was not well visualized. Pulmonic valve regurgitation is not visualized. No evidence of pulmonic stenosis. Aorta: The aortic root is normal in size and structure. Pulmonary Artery: The pulmonary artery is not well seen. Venous: The inferior vena cava was not well visualized. IAS/Shunts: The interatrial septum was not well visualized.  LEFT VENTRICLE PLAX 2D LVIDd:         3.87 cm   Diastology LVIDs:         2.15 cm  LV e' medial:    3.81 cm/s LV PW:         1.00 cm  LV E/e' medial:  20.4 LV IVS:        0.86 cm  LV e' lateral:   4.35 cm/s LVOT diam:     2.00 cm  LV E/e' lateral: 17.8 LV SV:         62 LV SV Index:   45 LVOT Area:     3.14 cm  RIGHT VENTRICLE RV Basal diam:  2.49 cm RV S prime:     13.70 cm/s TAPSE (M-mode): 2.9 cm LEFT ATRIUM           Index       RIGHT ATRIUM           Index LA diam:      4.50 cm 3.28 cm/m  RA Area:     17.10 cm LA Vol (A2C): 38.2 ml 27.81 ml/m RA Volume:   41.90 ml  30.50 ml/m LA Vol (A4C): 44.4 ml 32.32 ml/m  AORTIC VALVE                    PULMONIC VALVE AV Area (Vmax):    0.92 cm     PV Vmax:        0.75 m/s AV Area (Vmean):   1.00 cm     PV Peak grad:   2.2 mmHg AV Area (VTI):     1.32 cm     RVOT Peak grad: 3 mmHg AV Vmax:           245.00 cm/s AV Vmean:          162.667 cm/s AV VTI:            0.468 m AV Peak Grad:      24.0 mmHg AV Mean Grad:      16.0 mmHg LVOT Vmax:  71.80 cm/s LVOT Vmean:        51.800 cm/s LVOT VTI:          0.196 m LVOT/AV VTI ratio: 0.42  AORTA Ao Root diam: 2.80 cm MITRAL VALVE MV Area (PHT): 2.63 cm    SHUNTS MV Decel Time: 288 msec    Systemic VTI:  0.20 m MV E velocity: 77.60 cm/s  Systemic Diam: 2.00 cm MV A velocity: 86.50 cm/s MV E/A ratio:  0.90 Nelva Bush MD Electronically signed by Nelva Bush MD Signature Date/Time: 03/28/2021/5:25:50 PM    Final     Cardiac Studies   Echo 03/28/2021 1. Left ventricular ejection fraction, by estimation, is 65 to 70%. The  left ventricle has normal function. The left ventricle has no regional  wall motion abnormalities. There is mild left ventricular hypertrophy.  Left ventricular diastolic parameters  are consistent with Grade I diastolic dysfunction (impaired relaxation).  Elevated left atrial pressure.   2. Right ventricular systolic function is normal. The right ventricular  size is normal. Tricuspid regurgitation signal is inadequate for  assessing  PA pressure.   3. Left atrial size was mildly dilated.   4. The mitral valve is degenerative. Moderate mitral valve regurgitation.  No evidence of mitral stenosis. Severe mitral annular calcification.   5. The aortic valve has an indeterminant number of cusps. There is mild  calcification of the aortic valve. There is moderate thickening of the  aortic valve. Aortic valve regurgitation is trivial. Mild to moderate  aortic valve stenosis. Aortic valve  area, by VTI measures 1.32 cm. Aortic valve mean gradient measures 16.0  mmHg.   Patient Profile     87 y.o. female with history of bradycardia s/p pacemaker in 2018, paroxysmal A. fib on Eliquis, CVA, CKD stage III presenting with chest pain secondary to episode of atrial fibrillation.  Also being seen for elevated troponins.  Assessment & Plan    Paroxysmal atrial fibrillation -No further episodes on low-dose beta-blocker -Continue Lopressor 12.5 mg twice daily, continue Eliquis -Echo with preserved ejection fraction -If A. fib recurrence, recommend follow-up as outpatient with primary cardiology/EP Dr. Caryl Comes regarding restarting amiodarone.  Otherwise continue beta-blocker for now.  2.  Hypertension -Lopressor, amlodipine started -Low threshold to stop BP meds and let BP run higher due to fall risk, frailty.  3.  Elevated troponins -Secondary to demand supply mismatch -No additional ischemic work-up planned.   Currently working with physical therapy, continue cardiac medications as prescribed.  No additional cardiac testing planned this admission.  May need mobility device due to frailty.  Assessment by physical therapy underway.  Please let us know if additional input is needed.  Cardiology will sign off.   Total encounter time 35 minutes  Greater than 50% was spent in counseling and coordination of care with the patient     Signed, Kate Sable, MD  03/29/2021, 2:07 PM

## 2021-03-29 NOTE — Progress Notes (Signed)
[facility] Progress Note  Patient: Kimberly Hood    WNI:[id number]  DOA: 03/28/2021     Date of Service: the patient was seen and examined on 03/29/2021  Chief Complaint  Patient presents with   Chest Pain   Brief hospital course: Kimberly Hood is a 87 y.o. female with medical history significant for atrial fibrillation, hypertension, history of stroke, presence of permanent cardiac pacemaker, history of abdominal hysterectomy, presents to the emergency department from [facility] living facility for chief concerns of chest pain. Chest pain approximately midnight, lasted approximately 4 hours. She reports that this woke her up from sleep.  At bedside she denies shortness of breath.  She denies fever, nausea, vomiting. Her cardiology took her off of several medications.  No follow-up visit has been scheduled after the medications were discontinued.  However as patient's have been having elevated blood pressure since her medications were discontinued, low-dose lisinopril was resumed. Cardiology consulted and patient was admitted for further management as below.   Assessment and Plan: Principal Problem:   NSTEMI (non-ST elevated myocardial infarction) [facility]) Active Problems:   Mobitz type 2 second degree atrioventricular block   PAF (paroxysmal atrial fibrillation) ([place])   Acquired hypothyroidism   Primary hypertension   Chest pain   # NSTEMI, most likely demand ischemia secondary to A. fib with RVR Troponin peaked 333--282-245 gradually trended down S/p heparin IV infusion TTE shows EF 65 to 38%, grade 1 diastolic dysfunction, no wall motion abnormality Cardiology consulted, s/p heparin IV infusion, if patient will have persistent chest pain without A. fib then cardiac ischemic work-up will be considered Follow cardiology for further recommendation     # History of atrial fibrillation and CVA - Previously on warfarin - Previously on amiodarone 200 mg tablet, 1/2  tablet every day, - Patient is currently on Eliquis 2.5 mg twice daily - Heparin infusion was discontinued, resumed Eliquis by cardiology  Patient had bradycardia, metoprolol 25 mg twice daily was started, dose decreased to 12.5 mg p.o. twice daily, a.m. dose was skipped on 6/25 due to bradycardia    # History of sinus bradycardia status post [facility] pacemaker - Cardiac device interrogation - Outpatient follow-up  # History of hypertension-resumed home lisinopril Started amlodipine 5 mg daily Started metoprolol 12.5 mg p.o. twice daily Use hydralazine as needed  # Hyperlipidemia-simvastatin 40 mg nightly resumed  # Hypothyroid-levothyroxine 25 mcg q. a.m. resumed   # Dementia-donepezil 100 mg nightly resumed Started Seroquel 12.5 mg p.o. nightly for sundowning.  Body mass index is 21.42 kg/m.  Interventions:   Follow PT and OT eval for disposition plan      Diet: Heart healthy DVT Prophylaxis: Therapeutic Anticoagulation with Eliquis    Advance goals of care discussion: Full code  Family Communication: family was present at bedside, at the time of interview.  The pt provided permission to discuss medical plan with the family. Opportunity was given to ask question and all questions were answered satisfactorily.   Disposition:  Pt is from [place] living facility, admitted with chest pain and A. fib with RVR, still has A. fib, needs cardiology clearance, which precludes a safe discharge. Discharge to [facility] living facility, when Medically stable and cleared by cardiologist, may discharge in 1 to 2 days  Subjective: No significant overnight issues, patient was admitted due to chest pain and developed A. fib with RVR, currently patient is chest pain-free, denies any palpitations, no shortness of breath, patient was resting comfortably. As per patient's daughter that  patient was confused last night and she pulled IV line out. Patient daughter also said that she gets  confused in the evening and nighttime, possible sundowning.  Physical Exam: General:  alert oriented to time, place, and person.  Appear in no distress, affect appropriate Eyes: PERRLA ENT: Oral Mucosa Clear, moist  Neck: no JVD,  Cardiovascular: S1 and S2 Present, no Murmur,  Respiratory: good respiratory effort, Bilateral Air entry equal and Decreased, no Crackles, no wheezes Abdomen: Bowel Sound present, Soft and no tenderness,  Skin: no rashes Extremities: no Pedal edema, no calf tenderness Neurologic: without any new focal findings Gait not checked due to patient safety concerns  Vitals:   03/28/21 1815 03/29/21 0344 03/29/21 0730 03/29/21 1141  BP: (!) 114/45 (!) 135/53 (!) 152/63 (!) 141/58  Pulse: 65 (!) 59 63 (!) 59  Resp: 20 14 16 16   Temp: (!) 97.5 F (36.4 C) (!) 97.4 F (36.3 C) 98 F (36.7 C) 98.3 F (36.8 C)  TempSrc: Oral  Oral   SpO2: 100% 99% 97% 98%  Weight: 44.9 kg     Height: 4\' 9"  (1.448 m)       Intake/Output Summary (Last 24 hours) at 03/29/2021 1411 Last data filed at 03/29/2021 1408 Gross per 24 hour  Intake 0 ml  Output --  Net 0 ml   Filed Weights   03/28/21 0826 03/28/21 1815  Weight: 45.4 kg 44.9 kg    Data Reviewed: I have personally reviewed and interpreted daily labs, tele strips, imagings as discussed above. I reviewed all nursing notes, pharmacy notes, vitals, pertinent old records I have discussed plan of care as described above with RN and patient/family.  CBC: Recent Labs  Lab 03/28/21 0833 03/29/21 0527  WBC 12.8* 10.5  NEUTROABS 9.8*  --   HGB 12.8 11.9*  HCT 37.7 36.2  MCV 98.2 98.4  PLT 175 562   Basic Metabolic Panel: Recent Labs  Lab 03/28/21 0833 03/29/21 0527  NA 139 137  K 4.8 4.1  CL 104 106  CO2 27 23  GLUCOSE 127* 95  BUN 24* 26*  CREATININE 1.16* 1.10*  CALCIUM 9.1 8.6*    Studies: No results found.  Scheduled Meds:  amLODipine  5 mg Oral Daily   apixaban  2.5 mg Oral BID    cholecalciferol  1,000 Units Oral QHS   donepezil  10 mg Oral QHS   levothyroxine  25 mcg Oral Q0600   metoprolol tartrate  12.5 mg Oral BID   simvastatin  40 mg Oral QHS   Continuous Infusions: PRN Meds: acetaminophen **OR** acetaminophen, guaiFENesin-dextromethorphan, hydrALAZINE, ondansetron **OR** ondansetron (ZOFRAN) IV  Time spent: 35 minutes  Author: Val Riles. MD [facility] 03/29/2021 2:11 PM  To reach On-call, see care teams to locate the attending and reach out to them via [email or web address]. If 7PM-7AM, please contact night-coverage If you still have difficulty reaching the attending provider, please page the [facility] (Director on Call) for [facility] on amion for assistance.

## 2021-03-29 NOTE — Evaluation (Signed)
Occupational Therapy Evaluation Patient Details Name: Kimberly Hood MRN: [id number] DOB: [birth year] Today's Date: 03/29/2021    History of Present Illness 87 y.o. female with medical history significant for atrial fibrillation, hypertension, history of stroke, presence of permanent cardiac pacemaker, history of abdominal hysterectomy, presents to the emergency department from [facility] living facility for chief concerns of chest pain.   Clinical Impression   Patient presenting with decreased I in self care, balance, functional mobility/transfers, endurance, and safety awareness. Patient's daughter in law present during evaluation to confirm baseline. Pt lives at [facility] and ambulates without use of AD PTA. She walks to dinning room with friends that lives there and she uses pill packs with family calling to remind her if she took them. Her daughter in law reports her cognition as baseline. Pt initially needing min A - min guard and progressing to close supervision with min cuing for safety. Family requesting SNF. Pt may be able to progress home with pending progress.  Patient will benefit from acute OT to increase overall independence in the areas of ADLs, functional mobility, and safety awareness in order to safely discharge.    Follow Up Recommendations  SNF    Equipment Recommendations  Other (comment) (defer to next venue of care)       Precautions / Restrictions Precautions Precautions: Fall      Mobility Bed Mobility Overal bed mobility: Needs Assistance Bed Mobility: Supine to Sit     Supine to sit: Min assist     General bed mobility comments: assist for trunk support and min cuing for technique with increased time for pt to position self on EOB    Transfers Overall transfer level: Needs assistance Equipment used: None Transfers: Sit to/from [employer] transfer comment: min guard progressing to close supervision    Balance  Overall balance assessment: Needs assistance Sitting-balance support: Single extremity supported;Feet supported Sitting balance-Leahy Scale: Good     Standing balance support: During functional activity Standing balance-Leahy Scale: Fair                             ADL either performed or assessed with clinical judgement   ADL Overall ADL's : Needs assistance/impaired     Grooming: Wash/dry hands;Wash/dry face;Oral care;Standing;Min guard                   Toilet Transfer: Min guard;Ambulation Toilet Transfer Details (indicate cue type and reason): simulated         Functional mobility during ADLs: Min guard;Supervision/safety General ADL Comments: min guard initially for balance and holding onto IV pole. Pt then ambulating an additional 97' with close supervision.     Vision Patient Visual Report: No change from baseline              Pertinent Vitals/Pain Pain Assessment: No/denies pain     Hand Dominance Right   Extremity/Trunk Assessment Upper Extremity Assessment Upper Extremity Assessment: Overall WFL for tasks assessed   Lower Extremity Assessment Lower Extremity Assessment: Overall WFL for tasks assessed       Communication Communication Communication: No difficulties   Cognition Arousal/Alertness: Awake/alert Behavior During Therapy: WFL for tasks assessed/performed Overall Cognitive Status: History of cognitive impairments - at baseline  General Comments: Pt does have dementia and daughter in law in room reports her cognition seems at baseline this admission. Pt is very pleasant and cooperative. Ox 3.              Home Living Family/patient expects to be discharged to:: Other (Comment)                                 Additional Comments: ILF      Prior Functioning/Environment Level of Independence: Independent with assistive device(s)        Comments: Pt  reports being independent with mobility but does own RW and SPC. She uses shower chair for safety within the home. Goes to dinning room for meals and has pill packs for medication. she has had one fall in the shower 4 months ago per DIL.        OT Problem List: Decreased strength;Decreased activity tolerance;Impaired balance (sitting and/or standing);Decreased safety awareness      OT Treatment/Interventions: Self-care/ADL training;Therapeutic exercise;Modalities;Patient/family education;Balance training;Energy conservation;Cognitive remediation/compensation;DME and/or AE instruction;Therapeutic activities    OT Goals(Current goals can be found in the care plan section) Acute Rehab OT Goals Patient Stated Goal: to get stronger and increase balance OT Goal Formulation: With patient/family Time For Goal Achievement: 04/12/21 Potential to Achieve Goals: Fair ADL Goals Pt Will Perform Grooming: with modified independence;standing Pt Will Perform Lower Body Dressing: with modified independence;sit to/from stand Pt Will Transfer to Toilet: with modified independence;ambulating Pt Will Perform Toileting - Clothing Manipulation and hygiene: with modified independence;sit to/from stand  OT Frequency: Min 2X/week   Barriers to D/C:    none known at this time          AM-PAC OT "6 Clicks" Daily Activity     Outcome Measure Help from another person eating meals?: None Help from another person taking care of personal grooming?: A Little Help from another person toileting, which includes using toliet, bedpan, or urinal?: A Little Help from another person bathing (including washing, rinsing, drying)?: A Little Help from another person to put on and taking off regular upper body clothing?: None Help from another person to put on and taking off regular lower body clothing?: A Little 6 Click Score: 20   End of Session Nurse Communication: Mobility status  Activity Tolerance: Patient tolerated  treatment well Patient left: in chair;with call bell/phone within reach;with family/visitor present  OT Visit Diagnosis: Unsteadiness on feet (R26.81);Repeated falls (R29.6);Muscle weakness (generalized) (M62.81)                Time: 6948-5462 OT Time Calculation (min): 29 min Charges:  OT General Charges $OT Visit: 1 Visit OT Evaluation $OT Eval Low Complexity: 1 Low OT Treatments $Self Care/Home Management : 8-22 mins  Darleen Crocker, MS, OTR/L , CBIS ascom [phone]  03/29/21, 12:18 PM

## 2021-03-30 ENCOUNTER — Inpatient Hospital Stay: Payer: Medicare Other

## 2021-03-30 LAB — URINALYSIS, ROUTINE W REFLEX MICROSCOPIC
Bilirubin Urine: NEGATIVE
Glucose, UA: NEGATIVE mg/dL
Ketones, ur: NEGATIVE mg/dL
Nitrite: POSITIVE — AB
Protein, ur: 30 mg/dL — AB
RBC / HPF: >50 RBC/hpf — ABNORMAL HIGH (ref 0–5)
Specific Gravity, Urine: 1.018 (ref 1.005–1.030)
Squamous Epithelial / HPF: NONE SEEN (ref 0–5)
WBC, UA: >50 WBC/hpf — ABNORMAL HIGH (ref 0–5)
pH: 6 (ref 5.0–8.0)

## 2021-03-30 LAB — CBC
HCT: 36.6 % (ref 36.0–46.0)
Hemoglobin: 12.1 g/dL (ref 12.0–15.0)
MCH: 32.4 pg (ref 26.0–34.0)
MCHC: 33.1 g/dL (ref 30.0–36.0)
MCV: 97.9 fL (ref 80.0–100.0)
Platelets: 160 10*3/uL (ref 150–400)
RBC: 3.74 MIL/uL — ABNORMAL LOW (ref 3.87–5.11)
RDW: 14.7 % (ref 11.5–15.5)
WBC: 12.4 10*3/uL — ABNORMAL HIGH (ref 4.0–10.5)
nRBC: 0 % (ref 0.0–0.2)

## 2021-03-30 MED ORDER — SODIUM CHLORIDE 0.9 % IV SOLN
500.0000 mg | INTRAVENOUS | Status: DC
  Administered 2021-03-30: 500 mg via INTRAVENOUS
  Filled 2021-03-30 (×2): qty 500

## 2021-03-30 MED ORDER — SODIUM CHLORIDE 0.9 % IV SOLN
1.0000 g | INTRAVENOUS | Status: DC
  Administered 2021-03-30: 1 g via INTRAVENOUS
  Filled 2021-03-30: qty 10
  Filled 2021-03-30: qty 1

## 2021-03-30 NOTE — NC FL2 (Signed)
[place] LEVEL OF CARE SCREENING TOOL     IDENTIFICATION  Patient Name: Kimberly Hood Birthdate: 07-22-1926 Sex: female Admission Date (Current Location): 03/28/2021  [facility] and [place] Number:  Engineering geologist and Address:         Provider Number: [id number]  Attending Physician Name and Address:  Val Riles, MD  Relative Name and Phone Number:       Current Level of Care: Hospital Recommended Level of Care: [place] Prior Approval Number:    Date Approved/Denied:   PASRR Number: [id number]  Discharge Plan: SNF    Current Diagnoses: Patient Active Problem List   Diagnosis Date Noted   Elevated troponin    Chest pain 03/28/2021   NSTEMI (non-ST elevated myocardial infarction) ([place]) 03/28/2021   Atrial fibrillation with RVR ([place]) 03/28/2021   Demand ischemia ([place]) 03/28/2021   Full incontinence of feces 01/09/2021   Primary hypertension 10/10/2020   Annual physical exam 08/01/2020   Decreased hearing of both ears 08/01/2020   At moderate risk for fall 08/01/2020   Need for influenza vaccination 08/01/2020   PAF (paroxysmal atrial fibrillation) ([place]) 03/15/2020   Acquired hypothyroidism 03/15/2020   Memory change 03/15/2020   Seasonal allergic rhinitis due to pollen 03/15/2020   Rotator cuff arthropathy of right shoulder 03/15/2020   Mobitz type 2 second degree atrioventricular block 02/25/2017    Orientation RESPIRATION BLADDER Height & Weight     Self, Situation, Place, Time  Normal Incontinent, External catheter Weight: 44.2 kg Height:  4\' 9"  (144.8 cm)  BEHAVIORAL SYMPTOMS/MOOD NEUROLOGICAL BOWEL NUTRITION STATUS      Continent Diet (Heart Healthy)  AMBULATORY STATUS COMMUNICATION OF NEEDS Skin   Supervision   Normal                       Personal Care Assistance Level of Assistance              Functional Limitations Info             SPECIAL CARE FACTORS FREQUENCY  OT (By licensed  OT), PT (By licensed PT)                    Contractures Contractures Info: Not present    Additional Factors Info  Code Status, Allergies Code Status Info: Full Allergies Info: NKDA           Current Medications (03/30/2021):  This is the current hospital active medication list Current Facility-Administered Medications  Medication Dose Route Frequency Provider Last Rate Last Admin   acetaminophen (TYLENOL) tablet 650 mg  650 mg Oral Q6H PRN Cox, Amy N, DO       Or   acetaminophen (TYLENOL) suppository 650 mg  650 mg Rectal Q6H PRN Cox, Amy N, DO       amLODipine (NORVASC) tablet 5 mg  5 mg Oral Daily Val Riles, MD   5 mg at 03/30/21 1013   apixaban (ELIQUIS) tablet 2.5 mg  2.5 mg Oral BID Kate Sable, MD   2.5 mg at 03/30/21 1014   azithromycin (ZITHROMAX) 500 mg in sodium chloride 0.9 % 250 mL IVPB  500 mg Intravenous Q24H Val Riles, MD       cefTRIAXone (ROCEPHIN) 1 g in sodium chloride 0.9 % 100 mL IVPB  1 g Intravenous Q24H Val Riles, MD 200 mL/hr at 03/30/21 1401 1 g at 03/30/21 1401   cholecalciferol (VITAMIN D3) tablet 1,000 Units  1,000  Units Oral QHS Cox, Amy N, DO   1,000 Units at 03/28/21 2243   donepezil (ARICEPT) tablet 10 mg  10 mg Oral QHS Cox, Amy N, DO   10 mg at 03/28/21 2243   guaiFENesin-dextromethorphan (ROBITUSSIN DM) 100-10 MG/5ML syrup 5 mL  5 mL Oral Q4H PRN Cox, Amy N, DO   5 mL at 03/29/21 0226   hydrALAZINE (APRESOLINE) injection 5 mg  5 mg Intravenous Q6H PRN Cox, Amy N, DO   5 mg at 03/28/21 1708   levothyroxine (SYNTHROID) tablet 25 mcg  25 mcg Oral Q0600 Cox, Amy N, DO   25 mcg at 03/30/21 0523   metoprolol tartrate (LOPRESSOR) tablet 12.5 mg  12.5 mg Oral BID Val Riles, MD   12.5 mg at 03/30/21 1013   ondansetron (ZOFRAN) tablet 4 mg  4 mg Oral Q6H PRN Cox, Amy N, DO       Or   ondansetron (ZOFRAN) injection 4 mg  4 mg Intravenous Q6H PRN Cox, Amy N, DO       simvastatin (ZOCOR) tablet 40 mg  40 mg Oral QHS Cox, Amy N, DO    40 mg at 03/28/21 2243     Discharge Medications: Please see discharge summary for a list of discharge medications.  Relevant Imaging Results:  Relevant Lab Results:   Additional Information ss [id number]  Beverly Sessions, RN

## 2021-03-30 NOTE — TOC Initial Note (Signed)
Transition of Care [facility]) - Initial/Assessment Note    Patient Details  Name: Kimberly Hood MRN: [id number] Date of Birth: November 07, 1925  Transition of Care [facility]) CM/SW Contact:    Beverly Sessions, RN Phone Number: 03/30/2021, 2:12 PM  Clinical Narrative:                 Patient admitted from [facility] independent living for afib PT has recommended SNF.  Patient and daughter in law in agreement  PASRR obtained FL2 sent for signautre.   Clinical sent in [place].  Catalina Antigua notified   Expected Discharge Plan: [place] Barriers to Discharge: Continued Medical Work up   Patient Goals and CMS Choice        Expected Discharge Plan and Services Expected Discharge Plan: [place]                                              Prior Living Arrangements/Services                       Activities of Daily Living Home Assistive Devices/Equipment: Eyeglasses ADL Screening (condition at time of admission) Patient's cognitive ability adequate to safely complete daily activities?: Yes Is the patient deaf or have difficulty hearing?: No Does the patient have difficulty seeing, even when wearing glasses/contacts?: No Does the patient have difficulty concentrating, remembering, or making decisions?: Yes Patient able to express need for assistance with ADLs?: Yes Does the patient have difficulty dressing or bathing?: Yes Independently performs ADLs?: Yes (appropriate for developmental age) Does the patient have difficulty walking or climbing stairs?: Yes Weakness of Legs: Both Weakness of Arms/Hands: None  Permission Sought/Granted                  Emotional Assessment       Orientation: : Oriented to Self, Oriented to Place, Oriented to  Time, Oriented to Situation Alcohol / Substance Use: Not Applicable Psych Involvement: No (comment)  Admission diagnosis:  Elevated troponin [R77.8] Chest pain  [R07.9] Chest pain, unspecified type [R07.9] Atrial fibrillation with RVR ([place]) [I48.91] Patient Active Problem List   Diagnosis Date Noted   Elevated troponin    Chest pain 03/28/2021   NSTEMI (non-ST elevated myocardial infarction) ([place]) 03/28/2021   Atrial fibrillation with RVR ([place]) 03/28/2021   Demand ischemia ([place]) 03/28/2021   Full incontinence of feces 01/09/2021   Primary hypertension 10/10/2020   Annual physical exam 08/01/2020   Decreased hearing of both ears 08/01/2020   At moderate risk for fall 08/01/2020   Need for influenza vaccination 08/01/2020   PAF (paroxysmal atrial fibrillation) ([place]) 03/15/2020   Acquired hypothyroidism 03/15/2020   Memory change 03/15/2020   Seasonal allergic rhinitis due to pollen 03/15/2020   Rotator cuff arthropathy of right shoulder 03/15/2020   Mobitz type 2 second degree atrioventricular block 02/25/2017   PCP:  Cletis Athens, MD Pharmacy:   [id number], [place] - [address] [place] [address] Phone: [phone] Fax: [phone]  [facility] [place], [place] - [address] [place] [address] Phone: [phone] Fax: [phone]     Social Determinants of Health (SDOH) Interventions    Readmission Risk Interventions No flowsheet data found.

## 2021-03-30 NOTE — Evaluation (Signed)
Physical Therapy Evaluation Patient Details Name: Kimberly Hood MRN: [id number] DOB: [birth year] Today's Date: 03/30/2021   History of Present Illness  Patient is a 87 y.o. female with medical history significant for atrial fibrillation, hypertension, history of stroke, presence of permanent cardiac pacemaker, history of abdominal hysterectomy, presents to the emergency department from [facility] living facility for chief concerns of chest pain. Elevated troponins, secondary to demand supply mismatch, HTN  Clinical Impression  Patient agreeable to PT and eager to get out of bed and walk. Patient currently needs minimal assistance for bed mobility, sit to stand transfers, and Min guard assistance for ambulation using rolling walker. Patient ambulated in room and around nursing station without significant shortness of breath and no chest paint reported with mobility.  At baseline, patient lives in an independent living facility. As she currently does require minimal physical assistance with mobility, recommend SNF placement for short term rehab before going back to previous living environment. PT will continue to follow to maximize independence and facilitate return to prior level of function.      Follow Up Recommendations SNF    Equipment Recommendations  None recommended by PT    Recommendations for Other Services       Precautions / Restrictions Precautions Precautions: Fall Restrictions Weight Bearing Restrictions: No      Mobility  Bed Mobility Overal bed mobility: Needs Assistance Bed Mobility: Supine to Sit     Supine to sit: Min assist;HOB elevated     General bed mobility comments: patient needs assistance for scooting to edge of bed in preparation for standing. increased time required to complete tasks. cues for task initiation    Transfers Overall transfer level: Needs assistance Equipment used:  (with and without rolling walker) Transfers: Sit to/from  Stand Sit to Stand: Min assist;Min guard         General transfer comment: Min A to stand from bed and from toilet (using rolling walker from toilet). Min guard assistance for standing from recliner chair using BUE for support to push up. Patient needs extra time and cues for hand placement for safety with transfers  Ambulation/Gait Ambulation/Gait assistance: Min guard Gait Distance (Feet): 175 Feet Assistive device: Rolling walker (2 wheeled) Gait Pattern/deviations: Narrow base of support;Decreased stride length Gait velocity: decreased   General Gait Details: shuffled gait pattern initially that improved with increased gait distance. cues for proper placement and use of rolling walker for safety. Min guard provided for safety with heart rate in the 70's noted while walking. no chest pain is reported with activity. patient was fatigued after walking and declined ambulating further at this time  Science writer    Modified Rankin (Stroke Patients Only)       Balance Overall balance assessment: Needs assistance Sitting-balance support: Single extremity supported;Feet supported Sitting balance-Leahy Scale: Good     Standing balance support: Bilateral upper extremity supported;During functional activity Standing balance-Leahy Scale: Fair Standing balance comment: patient relying on rolling walker for support, otherwise is reaching out for furniture for support in standing                             Pertinent Vitals/Pain Pain Assessment: No/denies pain    Home Living Family/patient expects to be discharged to:: Private residence Living Arrangements: Other (Comment) ([place]) Available Help at Discharge: Family;Available PRN/intermittently Type of Home: Independent living facility Home  Access: Level entry     Home Layout: One level Home Equipment: Clinical cytogeneticist - 4 wheels      Prior Function Level of  Independence: Independent with assistive device(s)         Comments: Patient reports she does not use assistive device for ambulation but has a shower chair that she uses. She walks to the dining room for dinner     Hand Dominance        Extremity/Trunk Assessment   Upper Extremity Assessment Upper Extremity Assessment: Overall WFL for tasks assessed    Lower Extremity Assessment Lower Extremity Assessment: Generalized weakness (endurance imparied for sustained activity, no focal weakness noted with observation of functional activity)       Communication   Communication: No difficulties  Cognition Arousal/Alertness: Awake/alert Behavior During Therapy: WFL for tasks assessed/performed Overall Cognitive Status: History of cognitive impairments - at baseline                                 General Comments: patient is able to follow single step commands with extra time      General Comments      Exercises     Assessment/Plan    PT Assessment Patient needs continued PT services  PT Problem List Decreased strength;Decreased activity tolerance;Decreased balance;Decreased safety awareness       PT Treatment Interventions DME instruction;Gait training;Functional mobility training;Therapeutic activities;Therapeutic exercise;Neuromuscular re-education;Balance training;Patient/family education    PT Goals (Current goals can be found in the Care Plan section)  Acute Rehab PT Goals Patient Stated Goal: to be stronger and walk PT Goal Formulation: With patient Time For Goal Achievement: 04/13/21 Potential to Achieve Goals: Good    Frequency Min 2X/week   Barriers to discharge        Co-evaluation               AM-PAC PT "6 Clicks" Mobility  Outcome Measure Help needed turning from your back to your side while in a flat bed without using bedrails?: A Little Help needed moving from lying on your back to sitting on the side of a flat bed without  using bedrails?: A Little Help needed moving to and from a bed to a chair (including a wheelchair)?: A Little Help needed standing up from a chair using your arms (e.g., wheelchair or bedside chair)?: A Little Help needed to walk in hospital room?: A Little Help needed climbing 3-5 steps with a railing? : A Lot 6 Click Score: 17    End of Session Equipment Utilized During Treatment: Gait belt Activity Tolerance: Patient tolerated treatment well Patient left: in chair;with call bell/phone within reach;with family/visitor present Nurse Communication: Mobility status PT Visit Diagnosis: Unsteadiness on feet (R26.81);Muscle weakness (generalized) (M62.81)    Time: 4580-9983 PT Time Calculation (min) (ACUTE ONLY): 35 min   Charges:   PT Evaluation $PT Eval Moderate Complexity: 1 Mod PT Treatments $Therapeutic Activity: 8-22 mins        Minna Merritts, PT, MPT   Percell Locus 03/30/2021, 11:01 AM

## 2021-03-30 NOTE — Progress Notes (Signed)
[facility] Progress Note  Patient: Kimberly Hood    IDP:[id number]  DOA: 03/28/2021     Date of Service: the patient was seen and examined on 03/30/2021  Chief Complaint  Patient presents with   Chest Pain   Brief hospital course: Jullianna Gabor is a 87 y.o. female with medical history significant for atrial fibrillation, hypertension, history of stroke, presence of permanent cardiac pacemaker, history of abdominal hysterectomy, presents to the emergency department from [facility] living facility for chief concerns of chest pain. Chest pain approximately midnight, lasted approximately 4 hours. She reports that this woke her up from sleep.  At bedside she denies shortness of breath.  She denies fever, nausea, vomiting. Her cardiology took her off of several medications.  No follow-up visit has been scheduled after the medications were discontinued.  However as patient's have been having elevated blood pressure since her medications were discontinued, low-dose lisinopril was resumed. Cardiology consulted and patient was admitted for further management as below.   Assessment and Plan: Principal Problem:   NSTEMI (non-ST elevated myocardial infarction) [facility]) Active Problems:   Mobitz type 2 second degree atrioventricular block   PAF (paroxysmal atrial fibrillation) (HCC)   Acquired hypothyroidism   Primary hypertension   Chest pain    # Chronic bronchitis on CXR and dysuria, WBC elevated 6/26 Empirically started antibiotics ceftriaxone and azithromycin on 626 Follow UA and urine culture Follow chest x-ray De-escalate antibiotics accordingly   # NSTEMI, most likely demand ischemia secondary to A. fib with RVR Troponin peaked 333--[id number] gradually trended down S/p heparin IV infusion, d/c'd on 6/25 TTE shows EF 65 to 44%, grade 1 diastolic dysfunction, no wall motion abnormality Cardiology consulted, s/p heparin IV infusion, if patient will have persistent chest  pain without A. fib then cardiac ischemic work-up will be considered Seen by cardiology, recommended no ischemic work-up at this time     # History of atrial fibrillation and CVA - Previously on warfarin - Previously on amiodarone 200 mg tablet, 1/2 tablet every day, - Patient is currently on Eliquis 2.5 mg twice daily - Heparin infusion was discontinued, resumed Eliquis by cardiology  Patient had bradycardia, metoprolol 25 mg twice daily was started, dose decreased to 12.5 mg p.o. twice daily, a.m. dose was skipped on 6/25 due to bradycardia Follow-up with primary cardiology/EP Dr. Caryl Comes regarding restarting amiodarone, otherwise continue beta-blocker for now.   # History of sinus bradycardia status post [facility] pacemaker - Cardiac device interrogation - Outpatient follow-up  # History of hypertension-resumed home lisinopril Started amlodipine 5 mg daily Started metoprolol 12.5 mg p.o. twice daily Use hydralazine as needed  # Hyperlipidemia-simvastatin 40 mg nightly resumed  # Hypothyroid-levothyroxine 25 mcg q. a.m. resumed   # Dementia-donepezil 100 mg nightly resumed Started Seroquel 12.5 mg p.o. nightly for sundowning.  Body mass index is 21.42 kg/m.  Interventions:   PT and OT eval done, recommended SNF placement. Patient can be discharged to SNF when bed available on oral antibiotics if needed.     Diet: Heart healthy DVT Prophylaxis: Therapeutic Anticoagulation with Eliquis    Advance goals of care discussion: Full code  Family Communication: family was present at bedside, at the time of interview.  The pt provided permission to discuss medical plan with the family. Opportunity was given to ask question and all questions were answered satisfactorily.   Disposition:  Pt is from [place] living facility, admitted with chest pain and A. fib with RVR.medically stable, slightly elevated WBC count,  follow UA and urine culture, repeat chest x-ray and may discharge  on oral antibiotics if needed.  Cleared by cardiology.   Seen by PT and OT recommend SNF placement  Patient can be discharged tomorrow if bed available.   Subjective: No significant overnight issues, patient slept well, AAO x3, patient denied any active issues, no chest pain or palpitations. As per patient's daughter she is having mild cough and she was concerned about the x-ray findings so we will repeat x-ray and empirically started antibiotics as WBC count slightly elevated on CBC.  Patient was complaining of dysuria on further questioning so we will do UA and urine culture We will continue above management today, plan for disposition tomorrow a.m.  Physical Exam: General:  alert oriented to time, place, and person.  Appear in no distress, affect appropriate Eyes: PERRLA ENT: Oral Mucosa Clear, moist  Neck: no JVD,  Cardiovascular: S1 and S2 Present, no Murmur,  Respiratory: good respiratory effort, Bilateral Air entry equal and Decreased, no Crackles, no wheezes Abdomen: Bowel Sound present, Soft and no tenderness,  Skin: no rashes Extremities: no Pedal edema, no calf tenderness Neurologic: without any new focal findings Gait not checked due to patient safety concerns  Vitals:   03/29/21 1541 03/29/21 2229 03/30/21 0521 03/30/21 0727  BP: (!) 116/50 138/63 (!) 147/51 (!) 141/63  Pulse: 60 63 (!) 59 63  Resp: 16   18  Temp: 98.3 F (36.8 C) 98.2 F (36.8 C) 98.1 F (36.7 C) 98.3 F (36.8 C)  TempSrc:    Oral  SpO2: 96% 98% 97% 99%  Weight:   44.2 kg   Height:        Intake/Output Summary (Last 24 hours) at 03/30/2021 1059 Last data filed at 03/30/2021 0530 Gross per 24 hour  Intake 0 ml  Output 300 ml  Net -300 ml   Filed Weights   03/28/21 0826 03/28/21 1815 03/30/21 0521  Weight: 45.4 kg 44.9 kg 44.2 kg    Data Reviewed: I have personally reviewed and interpreted daily labs, tele strips, imagings as discussed above. I reviewed all nursing notes, pharmacy notes,  vitals, pertinent old records I have discussed plan of care as described above with RN and patient/family.  CBC: Recent Labs  Lab 03/28/21 0833 03/29/21 0527 03/30/21 0450  WBC 12.8* 10.5 12.4*  NEUTROABS 9.8*  --   --   HGB 12.8 11.9* 12.1  HCT 37.7 36.2 36.6  MCV 98.2 98.4 97.9  PLT 175 164 824   Basic Metabolic Panel: Recent Labs  Lab 03/28/21 0833 03/29/21 0527  NA 139 137  K 4.8 4.1  CL 104 106  CO2 27 23  GLUCOSE 127* 95  BUN 24* 26*  CREATININE 1.16* 1.10*  CALCIUM 9.1 8.6*    Studies: No results found.  Scheduled Meds:  amLODipine  5 mg Oral Daily   apixaban  2.5 mg Oral BID   cholecalciferol  1,000 Units Oral QHS   donepezil  10 mg Oral QHS   levothyroxine  25 mcg Oral Q0600   metoprolol tartrate  12.5 mg Oral BID   QUEtiapine  12.5 mg Oral QHS   simvastatin  40 mg Oral QHS   Continuous Infusions:  azithromycin     cefTRIAXone (ROCEPHIN)  IV     PRN Meds: acetaminophen **OR** acetaminophen, guaiFENesin-dextromethorphan, hydrALAZINE, ondansetron **OR** ondansetron (ZOFRAN) IV  Time spent: 35 minutes  Author: Val Riles. MD [facility] 03/30/2021 10:59 AM  To reach On-call, see care teams  to locate the attending and reach out to them via [email or web address]. If 7PM-7AM, please contact night-coverage If you still have difficulty reaching the attending provider, please page the [facility] (Director on Call) for [facility] on amion for assistance.

## 2021-03-30 NOTE — Progress Notes (Addendum)
Nutrition Brief Note  Patient identified on the Malnutrition Screening Tool (MST) Report  Wt Readings from Last 8 Encounters:  03/30/21 44.2 kg  02/18/21 45.4 kg  01/09/21 46.7 kg  11/11/20 47.4 kg  10/10/20 47.8 kg  08/01/20 48.2 kg  05/08/20 49 kg  03/15/20 48.3 kg   Body mass index is 21.09 kg/m. Patient meets criteria for normal based on current BMI.   Current diet order is heart healthy, patient is consuming approximately 100% of meals at this time. Labs and medications reviewed.   No nutrition interventions warranted at this time. If nutrition issues arise, please consult RD.   Larkin Ina, MS, RD, LDN (she/her/hers) RD pager number and weekend/on-call pager number located in [place].

## 2021-03-31 DIAGNOSIS — I4891 Unspecified atrial fibrillation: Secondary | ICD-10-CM | POA: Diagnosis not present

## 2021-03-31 DIAGNOSIS — I480 Paroxysmal atrial fibrillation: Secondary | ICD-10-CM | POA: Diagnosis not present

## 2021-03-31 DIAGNOSIS — R2689 Other abnormalities of gait and mobility: Secondary | ICD-10-CM | POA: Diagnosis not present

## 2021-03-31 DIAGNOSIS — M6281 Muscle weakness (generalized): Secondary | ICD-10-CM | POA: Diagnosis not present

## 2021-03-31 DIAGNOSIS — R1312 Dysphagia, oropharyngeal phase: Secondary | ICD-10-CM | POA: Diagnosis not present

## 2021-03-31 DIAGNOSIS — I10 Essential (primary) hypertension: Secondary | ICD-10-CM | POA: Diagnosis not present

## 2021-03-31 DIAGNOSIS — Z950 Presence of cardiac pacemaker: Secondary | ICD-10-CM | POA: Diagnosis not present

## 2021-03-31 DIAGNOSIS — Z8673 Personal history of transient ischemic attack (TIA), and cerebral infarction without residual deficits: Secondary | ICD-10-CM | POA: Diagnosis not present

## 2021-03-31 DIAGNOSIS — I25119 Atherosclerotic heart disease of native coronary artery with unspecified angina pectoris: Secondary | ICD-10-CM | POA: Diagnosis not present

## 2021-03-31 DIAGNOSIS — R262 Difficulty in walking, not elsewhere classified: Secondary | ICD-10-CM | POA: Diagnosis not present

## 2021-03-31 DIAGNOSIS — F0390 Unspecified dementia without behavioral disturbance: Secondary | ICD-10-CM | POA: Diagnosis not present

## 2021-03-31 DIAGNOSIS — I248 Other forms of acute ischemic heart disease: Secondary | ICD-10-CM | POA: Diagnosis not present

## 2021-03-31 LAB — CBC
HCT: 35.6 % — ABNORMAL LOW (ref 36.0–46.0)
Hemoglobin: 11.6 g/dL — ABNORMAL LOW (ref 12.0–15.0)
MCH: 32.7 pg (ref 26.0–34.0)
MCHC: 32.6 g/dL (ref 30.0–36.0)
MCV: 100.3 fL — ABNORMAL HIGH (ref 80.0–100.0)
Platelets: 156 10*3/uL (ref 150–400)
RBC: 3.55 MIL/uL — ABNORMAL LOW (ref 3.87–5.11)
RDW: 14.8 % (ref 11.5–15.5)
WBC: 8.9 10*3/uL (ref 4.0–10.5)
nRBC: 0 % (ref 0.0–0.2)

## 2021-03-31 LAB — BASIC METABOLIC PANEL
Anion gap: 6 (ref 5–15)
BUN: 25 mg/dL — ABNORMAL HIGH (ref 8–23)
CO2: 26 mmol/L (ref 22–32)
Calcium: 8.8 mg/dL — ABNORMAL LOW (ref 8.9–10.3)
Chloride: 105 mmol/L (ref 98–111)
Creatinine, Ser: 1.04 mg/dL — ABNORMAL HIGH (ref 0.44–1.00)
GFR, Estimated: 50 mL/min — ABNORMAL LOW (ref >60)
Glucose, Bld: 97 mg/dL (ref 70–99)
Potassium: 4.5 mmol/L (ref 3.5–5.1)
Sodium: 137 mmol/L (ref 135–145)

## 2021-03-31 LAB — PHOSPHORUS: Phosphorus: 4.2 mg/dL (ref 2.5–4.6)

## 2021-03-31 LAB — MAGNESIUM: Magnesium: 2.1 mg/dL (ref 1.7–2.4)

## 2021-03-31 MED ORDER — FOSFOMYCIN TROMETHAMINE 3 G PO PACK
3.0000 g | PACK | Freq: Once | ORAL | Status: AC
  Administered 2021-03-31: 3 g via ORAL
  Filled 2021-03-31: qty 3

## 2021-03-31 MED ORDER — METOPROLOL TARTRATE 25 MG PO TABS: 12.5000 mg | ORAL_TABLET | Freq: Two times a day (BID) | ORAL | 0 refills | Status: DC

## 2021-03-31 NOTE — Progress Notes (Signed)
Discussed discharge instruction with patient and daughter.  Sent AVS home with patient/family.    Called and gave patient report to nurse at [facility].

## 2021-03-31 NOTE — Discharge Summary (Signed)
Physician Discharge Summary  Patient ID: Kimberly Hood MRN: [id number] DOB/AGE: April 27, 1926 87 y.o.  Admit date: 03/28/2021 Discharge date: 03/31/2021  Admission Diagnoses:  Discharge Diagnoses:  Principal Problem:   Atrial fibrillation with RVR ([place]) Active Problems:   Mobitz type 2 second degree atrioventricular block   PAF (paroxysmal atrial fibrillation) (HCC)   Acquired hypothyroidism   Primary hypertension   Chest pain   Demand ischemia (HCC)   Elevated troponin   Discharged Condition: fair  Hospital Course:  Kimberly Hood is a 87 y.o. female with medical history significant for atrial fibrillation, hypertension, history of stroke, presence of permanent cardiac pacemaker, history of abdominal hysterectomy, presents to the emergency department from [facility] living facility for chief concerns of chest pain. Chest pain approximately midnight, lasted approximately 4 hours. She reports that this woke her up from sleep.  At bedside she denies shortness of breath.  She denies fever, nausea, vomiting. Her cardiology took her off of several medications.  No follow-up visit has been scheduled after the medications were discontinued.  However as patient's have been having elevated blood pressure since her medications were discontinued, low-dose lisinopril was resumed.  #1.  Non-STEMI.  Secondary to atrial fibrillation. Paroxysmal atrial fibrillation with rapid ventricle response. Hx of CVA Patient condition has improved, peak troponin 333, seen by cardiology, not a candidate for additional work-up.  Patient has ejection fraction of 65 to 70% with grade 1 diastolic dysfunction. Currently, patient heart rate better controlled, will continue beta-blocker and ACE inhibitor. Resumed Eliquis.  #2.  Urinary tract infection. Patient developed urinary symptoms, UA support urinary tract infection.  She was started on Rocephin. She does not have sepsis, at this time I will give  her 1 dose of fosfomycin before discharge.  3.  Essential hypertension. Dyslipidemia Resume home medicines per  4.  Dementia.  Consults: cardiology  Significant Diagnostic Studies:   Echo: 1. Left ventricular ejection fraction, by estimation, is 65 to 70%. The left ventricle has normal function. The left ventricle has no regional wall motion abnormalities. There is mild left ventricular hypertrophy. Left ventricular diastolic parameters are consistent with Grade I diastolic dysfunction (impaired relaxation). Elevated left atrial pressure. 2. Right ventricular systolic function is normal. The right ventricular size is normal. Tricuspid regurgitation signal is inadequate for assessing PA pressure. 3. Left atrial size was mildly dilated. 4. The mitral valve is degenerative. Moderate mitral valve regurgitation. No evidence of mitral stenosis. Severe mitral annular calcification. 5. The aortic valve has an indeterminant number of cusps. There is mild calcification of the aortic valve. There is moderate thickening of the aortic valve. Aortic valve regurgitation is trivial. Mild to moderate aortic valve stenosis. Aortic valve area, by VTI measures 1.32 cm. Aortic valve mean gradient measures 16.0 mmHg.   Treatments: beta blocker, antibiotics  Discharge Exam: Blood pressure 136/70, pulse 64, temperature 98.1 F (36.7 C), temperature source Oral, resp. rate 18, height 4\' 9"  (1.448 m), weight 44.2 kg, SpO2 97 %. General appearance: alert and cooperative Resp: clear to auscultation bilaterally Cardio: irregularly irregular rhythm and no murmurs GI: soft, non-tender; bowel sounds normal; no masses,  no organomegaly Extremities: extremities normal, atraumatic, no cyanosis or edema  Disposition: Discharge disposition: 03-Skilled Nursing Facility       Discharge Instructions     Diet - low sodium heart healthy   Complete by: As directed    Increase activity slowly   Complete by:  As directed       Allergies as of  03/31/2021   No Known Allergies      Medication List     STOP taking these medications    potassium chloride SA 20 MEQ tablet Commonly known as: KLOR-CON       TAKE these medications    apixaban 2.5 MG Tabs tablet Commonly known as: Eliquis Take 1 tablet (2.5 mg total) by mouth 2 (two) times daily.   cholecalciferol 25 MCG (1000 UNIT) tablet Commonly known as: VITAMIN D3 Take 1,000 Units by mouth at bedtime.   donepezil 10 MG tablet Commonly known as: ARICEPT Take 10 mg by mouth at bedtime.   levothyroxine 25 MCG tablet Commonly known as: SYNTHROID TAKE ONE TABLET BY MOUTH EVERY DAY BEFORE BREAKFAST What changed: See the new instructions.   lisinopril 2.5 MG tablet Commonly known as: ZESTRIL Take 1 tablet (2.5 mg total) by mouth daily. What changed: when to take this   metoprolol tartrate 25 MG tablet Commonly known as: LOPRESSOR Take 0.5 tablets (12.5 mg total) by mouth 2 (two) times daily.   simvastatin 40 MG tablet Commonly known as: ZOCOR TAKE ONE TABLET AT BEDTIME        Follow-up Information     Cletis Athens, MD Follow up in 1 week(s).   Specialty: Cardiology Contact information: [address] [place] [address] [phone]                32 minutes Signed: Sharen Hones 03/31/2021, 1:01 PM

## 2021-03-31 NOTE — TOC Transition Note (Signed)
Transition of Care [facility]) - CM/SW Discharge Note   Patient Details  Name: Kimberly Hood MRN: [id number] Date of Birth: 19-Dec-1925  Transition of Care [facility]) CM/SW Contact:  Alberteen Sam, LCSW Phone Number: 03/31/2021, 1:29 PM   Clinical Narrative:      Patient will DC to: [facility] Anticipated DC date: 03/31/21 Family notified: family at bedside to transport   Per MD patient ready for DC to [facility]. RN, patient, patient's family, and facility notified of DC. Discharge Summary sent to facility. RN given number for report  [phone] Room 355.  CSW signing off.  Pricilla Riffle, LCSW   Final next level of care: Skilled Nursing Facility Barriers to Discharge: No Barriers Identified   Patient Goals and CMS Choice Patient states their goals for this hospitalization and ongoing recovery are:: to go to SNF CMS Medicare.gov Compare Post Acute Care list provided to:: Patient Represenative (must comment) Choice offered to / list presented to : Patient  Discharge Placement                Patient to be transferred to facility by: family at bedside   Patient and family notified of of transfer: 03/31/21  Discharge Plan and Services                                     Social Determinants of Health (SDOH) Interventions     Readmission Risk Interventions No flowsheet data found.

## 2021-04-01 DIAGNOSIS — F0390 Unspecified dementia without behavioral disturbance: Secondary | ICD-10-CM | POA: Diagnosis not present

## 2021-04-01 DIAGNOSIS — I25119 Atherosclerotic heart disease of native coronary artery with unspecified angina pectoris: Secondary | ICD-10-CM | POA: Diagnosis not present

## 2021-04-01 DIAGNOSIS — I4891 Unspecified atrial fibrillation: Secondary | ICD-10-CM | POA: Diagnosis not present

## 2021-04-02 LAB — URINE CULTURE: Culture: 80000 — AB

## 2021-04-03 ENCOUNTER — Telehealth: Payer: Self-pay | Admitting: Internal Medicine

## 2021-04-03 NOTE — Telephone Encounter (Signed)
Message received from Dr. Caryl Comes that he spoke with [facility]. He advised the patient needs to be set up for an in office visit with him in ~ 6 weeks.  Will forward to scheduling to please reach out to arrange.  Possible appt slots are: Thursday 05/15/21 at 8:20 am or 11:40 am Tuesday 05/20/21 at 8:40 am Thursday 05/22/21 at 8:40 am or 11:40 am

## 2021-04-03 NOTE — Telephone Encounter (Signed)
I have reviewed the patient's chart with Dr. Caryl Comes and advised that since her office visit with him on 02/17/21, the patient's daughter in law, Sharl Ma, has called about her BP's being elevated. Then, she called last week advising the patient was admitted with chest pain and an elevated troponin. I advised Dr. Caryl Comes that during our discussion regarding the patient's hospitalization, Sharl Ma was asking several questions regarding the patient's care that I felt, as a nurse,  that a provider should be advising her on.   Dr. Caryl Comes has reviewed the patient's chart and advised her will try to call [name] en route to his [facility] today.  I have asked Dr. Caryl Comes to please let me know if he is able to reach [facility].

## 2021-04-03 NOTE — Telephone Encounter (Signed)
Daughter called wants to discuss mothers progress/status with Nira Conn. Please assist.

## 2021-04-04 NOTE — Telephone Encounter (Signed)
Reviewed the patient's appointments. She is scheduled to see Dr. Caryl Comes on 05/22/21 at 11:40 am.

## 2021-04-14 ENCOUNTER — Other Ambulatory Visit: Payer: Self-pay | Admitting: Internal Medicine

## 2021-04-17 ENCOUNTER — Other Ambulatory Visit: Payer: Self-pay | Admitting: *Deleted

## 2021-04-17 MED ORDER — METOPROLOL TARTRATE 25 MG PO TABS: 12.5000 mg | ORAL_TABLET | Freq: Two times a day (BID) | ORAL | 1 refills | Status: DC

## 2021-04-25 DIAGNOSIS — I480 Paroxysmal atrial fibrillation: Secondary | ICD-10-CM | POA: Diagnosis not present

## 2021-04-25 DIAGNOSIS — F0390 Unspecified dementia without behavioral disturbance: Secondary | ICD-10-CM | POA: Diagnosis not present

## 2021-04-25 DIAGNOSIS — R2689 Other abnormalities of gait and mobility: Secondary | ICD-10-CM | POA: Diagnosis not present

## 2021-04-25 DIAGNOSIS — R2681 Unsteadiness on feet: Secondary | ICD-10-CM | POA: Diagnosis not present

## 2021-04-25 DIAGNOSIS — R279 Unspecified lack of coordination: Secondary | ICD-10-CM | POA: Diagnosis not present

## 2021-04-25 DIAGNOSIS — Z9181 History of falling: Secondary | ICD-10-CM | POA: Diagnosis not present

## 2021-04-25 DIAGNOSIS — M6281 Muscle weakness (generalized): Secondary | ICD-10-CM | POA: Diagnosis not present

## 2021-04-29 ENCOUNTER — Other Ambulatory Visit (INDEPENDENT_AMBULATORY_CARE_PROVIDER_SITE_OTHER): Payer: Medicare Other

## 2021-04-29 ENCOUNTER — Other Ambulatory Visit: Payer: Self-pay

## 2021-04-29 DIAGNOSIS — Z9181 History of falling: Secondary | ICD-10-CM | POA: Diagnosis not present

## 2021-04-29 DIAGNOSIS — I480 Paroxysmal atrial fibrillation: Secondary | ICD-10-CM | POA: Diagnosis not present

## 2021-04-29 DIAGNOSIS — R413 Other amnesia: Secondary | ICD-10-CM | POA: Diagnosis not present

## 2021-04-29 DIAGNOSIS — R2689 Other abnormalities of gait and mobility: Secondary | ICD-10-CM | POA: Diagnosis not present

## 2021-04-29 DIAGNOSIS — R2681 Unsteadiness on feet: Secondary | ICD-10-CM | POA: Diagnosis not present

## 2021-04-29 DIAGNOSIS — R279 Unspecified lack of coordination: Secondary | ICD-10-CM | POA: Diagnosis not present

## 2021-04-29 DIAGNOSIS — M6281 Muscle weakness (generalized): Secondary | ICD-10-CM | POA: Diagnosis not present

## 2021-04-29 LAB — POCT URINALYSIS DIPSTICK
Bilirubin, UA: NEGATIVE
Blood, UA: POSITIVE
Glucose, UA: NEGATIVE
Ketones, UA: NEGATIVE
Leukocytes, UA: NEGATIVE
Nitrite, UA: POSITIVE
Protein, UA: POSITIVE — AB
Spec Grav, UA: 1.025 (ref 1.010–1.025)
Urobilinogen, UA: 0.2 E.U./dL
pH, UA: 6 (ref 5.0–8.0)

## 2021-04-29 MED ORDER — SULFAMETHOXAZOLE-TRIMETHOPRIM 800-160 MG PO TABS: 1.0000 | ORAL_TABLET | Freq: Two times a day (BID) | ORAL | 0 refills | Status: DC

## 2021-04-29 MED ORDER — CIPROFLOXACIN HCL 500 MG PO TABS: 500.0000 mg | ORAL_TABLET | Freq: Two times a day (BID) | ORAL | 0 refills | Status: AC

## 2021-05-01 DIAGNOSIS — R279 Unspecified lack of coordination: Secondary | ICD-10-CM | POA: Diagnosis not present

## 2021-05-01 DIAGNOSIS — R2681 Unsteadiness on feet: Secondary | ICD-10-CM | POA: Diagnosis not present

## 2021-05-01 DIAGNOSIS — M6281 Muscle weakness (generalized): Secondary | ICD-10-CM | POA: Diagnosis not present

## 2021-05-01 DIAGNOSIS — Z9181 History of falling: Secondary | ICD-10-CM | POA: Diagnosis not present

## 2021-05-01 DIAGNOSIS — I480 Paroxysmal atrial fibrillation: Secondary | ICD-10-CM | POA: Diagnosis not present

## 2021-05-01 DIAGNOSIS — R2689 Other abnormalities of gait and mobility: Secondary | ICD-10-CM | POA: Diagnosis not present

## 2021-05-02 DIAGNOSIS — R2681 Unsteadiness on feet: Secondary | ICD-10-CM | POA: Diagnosis not present

## 2021-05-02 DIAGNOSIS — Z9181 History of falling: Secondary | ICD-10-CM | POA: Diagnosis not present

## 2021-05-02 DIAGNOSIS — R279 Unspecified lack of coordination: Secondary | ICD-10-CM | POA: Diagnosis not present

## 2021-05-02 DIAGNOSIS — I480 Paroxysmal atrial fibrillation: Secondary | ICD-10-CM | POA: Diagnosis not present

## 2021-05-02 DIAGNOSIS — R2689 Other abnormalities of gait and mobility: Secondary | ICD-10-CM | POA: Diagnosis not present

## 2021-05-02 DIAGNOSIS — M6281 Muscle weakness (generalized): Secondary | ICD-10-CM | POA: Diagnosis not present

## 2021-05-06 DIAGNOSIS — I480 Paroxysmal atrial fibrillation: Secondary | ICD-10-CM | POA: Diagnosis not present

## 2021-05-06 DIAGNOSIS — R279 Unspecified lack of coordination: Secondary | ICD-10-CM | POA: Diagnosis not present

## 2021-05-06 DIAGNOSIS — Z9181 History of falling: Secondary | ICD-10-CM | POA: Diagnosis not present

## 2021-05-06 DIAGNOSIS — M6281 Muscle weakness (generalized): Secondary | ICD-10-CM | POA: Diagnosis not present

## 2021-05-06 DIAGNOSIS — R41841 Cognitive communication deficit: Secondary | ICD-10-CM | POA: Diagnosis not present

## 2021-05-06 DIAGNOSIS — R2681 Unsteadiness on feet: Secondary | ICD-10-CM | POA: Diagnosis not present

## 2021-05-06 DIAGNOSIS — I10 Essential (primary) hypertension: Secondary | ICD-10-CM | POA: Diagnosis not present

## 2021-05-06 DIAGNOSIS — F0390 Unspecified dementia without behavioral disturbance: Secondary | ICD-10-CM | POA: Diagnosis not present

## 2021-05-06 DIAGNOSIS — R2689 Other abnormalities of gait and mobility: Secondary | ICD-10-CM | POA: Diagnosis not present

## 2021-05-07 DIAGNOSIS — M6281 Muscle weakness (generalized): Secondary | ICD-10-CM | POA: Diagnosis not present

## 2021-05-07 DIAGNOSIS — F0390 Unspecified dementia without behavioral disturbance: Secondary | ICD-10-CM | POA: Diagnosis not present

## 2021-05-07 DIAGNOSIS — R41841 Cognitive communication deficit: Secondary | ICD-10-CM | POA: Diagnosis not present

## 2021-05-07 DIAGNOSIS — R279 Unspecified lack of coordination: Secondary | ICD-10-CM | POA: Diagnosis not present

## 2021-05-07 DIAGNOSIS — R2681 Unsteadiness on feet: Secondary | ICD-10-CM | POA: Diagnosis not present

## 2021-05-07 DIAGNOSIS — R2689 Other abnormalities of gait and mobility: Secondary | ICD-10-CM | POA: Diagnosis not present

## 2021-05-09 DIAGNOSIS — M6281 Muscle weakness (generalized): Secondary | ICD-10-CM | POA: Diagnosis not present

## 2021-05-09 DIAGNOSIS — R2681 Unsteadiness on feet: Secondary | ICD-10-CM | POA: Diagnosis not present

## 2021-05-09 DIAGNOSIS — R279 Unspecified lack of coordination: Secondary | ICD-10-CM | POA: Diagnosis not present

## 2021-05-09 DIAGNOSIS — R41841 Cognitive communication deficit: Secondary | ICD-10-CM | POA: Diagnosis not present

## 2021-05-09 DIAGNOSIS — R2689 Other abnormalities of gait and mobility: Secondary | ICD-10-CM | POA: Diagnosis not present

## 2021-05-09 DIAGNOSIS — F0390 Unspecified dementia without behavioral disturbance: Secondary | ICD-10-CM | POA: Diagnosis not present

## 2021-05-14 DIAGNOSIS — R41841 Cognitive communication deficit: Secondary | ICD-10-CM | POA: Diagnosis not present

## 2021-05-14 DIAGNOSIS — R2689 Other abnormalities of gait and mobility: Secondary | ICD-10-CM | POA: Diagnosis not present

## 2021-05-14 DIAGNOSIS — M6281 Muscle weakness (generalized): Secondary | ICD-10-CM | POA: Diagnosis not present

## 2021-05-14 DIAGNOSIS — F0390 Unspecified dementia without behavioral disturbance: Secondary | ICD-10-CM | POA: Diagnosis not present

## 2021-05-14 DIAGNOSIS — R2681 Unsteadiness on feet: Secondary | ICD-10-CM | POA: Diagnosis not present

## 2021-05-14 DIAGNOSIS — R279 Unspecified lack of coordination: Secondary | ICD-10-CM | POA: Diagnosis not present

## 2021-05-15 DIAGNOSIS — R279 Unspecified lack of coordination: Secondary | ICD-10-CM | POA: Diagnosis not present

## 2021-05-15 DIAGNOSIS — R2689 Other abnormalities of gait and mobility: Secondary | ICD-10-CM | POA: Diagnosis not present

## 2021-05-15 DIAGNOSIS — F0390 Unspecified dementia without behavioral disturbance: Secondary | ICD-10-CM | POA: Diagnosis not present

## 2021-05-15 DIAGNOSIS — R2681 Unsteadiness on feet: Secondary | ICD-10-CM | POA: Diagnosis not present

## 2021-05-15 DIAGNOSIS — M6281 Muscle weakness (generalized): Secondary | ICD-10-CM | POA: Diagnosis not present

## 2021-05-15 DIAGNOSIS — R41841 Cognitive communication deficit: Secondary | ICD-10-CM | POA: Diagnosis not present

## 2021-05-16 DIAGNOSIS — R2689 Other abnormalities of gait and mobility: Secondary | ICD-10-CM | POA: Diagnosis not present

## 2021-05-16 DIAGNOSIS — R41841 Cognitive communication deficit: Secondary | ICD-10-CM | POA: Diagnosis not present

## 2021-05-16 DIAGNOSIS — F0390 Unspecified dementia without behavioral disturbance: Secondary | ICD-10-CM | POA: Diagnosis not present

## 2021-05-16 DIAGNOSIS — R2681 Unsteadiness on feet: Secondary | ICD-10-CM | POA: Diagnosis not present

## 2021-05-16 DIAGNOSIS — M6281 Muscle weakness (generalized): Secondary | ICD-10-CM | POA: Diagnosis not present

## 2021-05-16 DIAGNOSIS — R279 Unspecified lack of coordination: Secondary | ICD-10-CM | POA: Diagnosis not present

## 2021-05-20 DIAGNOSIS — R279 Unspecified lack of coordination: Secondary | ICD-10-CM | POA: Diagnosis not present

## 2021-05-20 DIAGNOSIS — R2681 Unsteadiness on feet: Secondary | ICD-10-CM | POA: Diagnosis not present

## 2021-05-20 DIAGNOSIS — R41841 Cognitive communication deficit: Secondary | ICD-10-CM | POA: Diagnosis not present

## 2021-05-20 DIAGNOSIS — F0390 Unspecified dementia without behavioral disturbance: Secondary | ICD-10-CM | POA: Diagnosis not present

## 2021-05-20 DIAGNOSIS — R2689 Other abnormalities of gait and mobility: Secondary | ICD-10-CM | POA: Diagnosis not present

## 2021-05-20 DIAGNOSIS — M6281 Muscle weakness (generalized): Secondary | ICD-10-CM | POA: Diagnosis not present

## 2021-05-21 DIAGNOSIS — R2689 Other abnormalities of gait and mobility: Secondary | ICD-10-CM | POA: Diagnosis not present

## 2021-05-21 DIAGNOSIS — R41841 Cognitive communication deficit: Secondary | ICD-10-CM | POA: Diagnosis not present

## 2021-05-21 DIAGNOSIS — R2681 Unsteadiness on feet: Secondary | ICD-10-CM | POA: Diagnosis not present

## 2021-05-21 DIAGNOSIS — R279 Unspecified lack of coordination: Secondary | ICD-10-CM | POA: Diagnosis not present

## 2021-05-21 DIAGNOSIS — F0390 Unspecified dementia without behavioral disturbance: Secondary | ICD-10-CM | POA: Diagnosis not present

## 2021-05-21 DIAGNOSIS — M6281 Muscle weakness (generalized): Secondary | ICD-10-CM | POA: Diagnosis not present

## 2021-05-22 ENCOUNTER — Other Ambulatory Visit: Payer: Self-pay

## 2021-05-22 ENCOUNTER — Ambulatory Visit (INDEPENDENT_AMBULATORY_CARE_PROVIDER_SITE_OTHER): Payer: Medicare Other | Admitting: Internal Medicine

## 2021-05-22 VITALS — BP 106/68 | HR 60 | Ht <= 58 in | Wt 97.4 lb

## 2021-05-22 DIAGNOSIS — Z950 Presence of cardiac pacemaker: Secondary | ICD-10-CM

## 2021-05-22 DIAGNOSIS — R001 Bradycardia, unspecified: Secondary | ICD-10-CM

## 2021-05-22 DIAGNOSIS — I10 Essential (primary) hypertension: Secondary | ICD-10-CM

## 2021-05-22 DIAGNOSIS — I480 Paroxysmal atrial fibrillation: Secondary | ICD-10-CM | POA: Diagnosis not present

## 2021-05-22 NOTE — Patient Instructions (Signed)
Medication Instructions:  - Your physician recommends that you continue on your current medications as directed. Please refer to the Current Medication list given to you today.  *If you need a refill on your cardiac medications before your next appointment, please call your pharmacy*   Lab Work: - none ordered  If you have labs (blood work) drawn today and your tests are completely normal, you will receive your results only by: [place] (if you have MyChart) OR A paper copy in the mail If you have any lab test that is abnormal or we need to change your treatment, we will call you to review the results.   Testing/Procedures: - none ordered   Follow-Up: At [facility], you and your health needs are our priority.  As part of our continuing mission to provide you with exceptional heart care, we have created designated Provider Care Teams.  These Care Teams include your primary Cardiologist (physician) and Advanced Practice Providers (APPs -  Physician Assistants and Nurse Practitioners) who all work together to provide you with the care you need, when you need it.  We recommend signing up for the patient portal called "MyChart".  Sign up information is provided on this After Visit Summary.  MyChart is used to connect with patients for Virtual Visits (Telemedicine).  Patients are able to view lab/test results, encounter notes, upcoming appointments, etc.  Non-urgent messages can be sent to your provider as well.   To learn more about what you can do with MyChart, go to [email or web address].    Your next appointment:   6 month(s)  The format for your next appointment:   In Person  Provider:   Virl Axe, MD   Other Instructions - Nira Conn, Dr. Olin Pia nurse will call you in about 2 weeks to follow up

## 2021-05-22 NOTE — Progress Notes (Signed)
Patient Care Team: Cletis Athens, MD as PCP - General (Internal Medicine)   HPI  Kimberly Hood is a 87 y.o. female Seen in followup for pacemaker (St Jude  His Bundle)  Implanted (GT)  for syncope with bifascicular block (5/18) Prior hx of reported AFib with CVA on warfarin.  At the last visit I thought we had discontinued her ACE inhibitor and discontinued amiodarone but elected to keep her on anticoagulation with her prior history of stroke.  She is taking now Eliquis and is without bleeding.  It turns out that these have been continued in her pill packs.    After her last office visit, 5/22 in the absence of interval atrial fibrillation/flutter her amiodarone was discontinued.  And with hypotension, blood pressures 92/62, lisinopril was discontinued.  She ended up in the hospital with atrial fibrillation and hypertension.  Eliquis was continued.  Rate control was accomplished with beta-blockers.  And lisinopril was resumed.  I had spoken with the daughter-in-law after the aforementioned events as they were concerned about the lack of follow-up following the discontinuation of these medications  Comes in today "doing great for 87 yo," but her daughter-in-law is quite concerned about the fact that she continues to lose weight, decreased appetite.  She has energetic days and more energetic dates.   5/18 Echo EF 60-65%  AS grad mild 17 mm 6/22 Echo EF 60-65% AS grad mild 44m   Date Cr K Hgb WBC  5/18 1.19 4.6 13.9   6/19     9.3  6/21 1.71  13.8 13.3  6/22 1.04 4.5 11.6 10.5        Past Medical History:  Diagnosis Date   Heart attack ([place]    Hypertension    MI ([place]ocardial infarction) [place] 2009   Presence of permanent cardiac pacemaker 02/24/2017   Stroke ([facility]     Past Surgical History:  Procedure Laterality Date   ABDOMINAL HYSTERECTOMY     APPENDECTOMY     PACEMAKER IMPLANT N/A 02/25/2017   Procedure: Pacemaker Implant;  Surgeon: TEvans Lance MD;   [facility];  Service: Cardiovascular;  Laterality: N/A;   TONSILLECTOMY      Current Outpatient Medications  Medication Sig Dispense Refill   apixaban (ELIQUIS) 2.5 MG TABS tablet Take 1 tablet (2.5 mg total) by mouth 2 (two) times daily. 60 tablet 11   cholecalciferol (VITAMIN D3) 25 MCG (1000 UNIT) tablet Take 1,000 Units by mouth at bedtime.     donepezil (ARICEPT) 10 MG tablet Take 10 mg by mouth at bedtime.     levothyroxine (SYNTHROID) 25 MCG tablet TAKE ONE TABLET BY MOUTH EVERY DAY BEFORE BREAKFAST 30 tablet 6   lisinopril (ZESTRIL) 2.5 MG tablet TAKE 1 TABLET BY MOUTH DAILY 30 tablet 3   metoprolol tartrate (LOPRESSOR) 25 MG tablet Take 0.5 tablets (12.5 mg total) by mouth 2 (two) times daily. 30 tablet 1   simvastatin (ZOCOR) 40 MG tablet TAKE ONE TABLET AT BEDTIME 30 tablet 6   No current facility-administered medications for this visit.    No Known Allergies    Review of Systems negative except from HPI and PMH  Physical Exam\BP 106/68   Pulse 60   Ht '4\' 9"'$  (1.448 m)   Wt 97 lb 6.4 oz (44.2 kg)   SpO2 94%   BMI 21.08 kg/m  Well developed and cachectic in no acute distress HENT normal Neck supple   Clear Device pocket well  healed; without hematoma or erythema.  There is no tethering  Regular rate and rhythm, no murmur Abd-soft with active BS No Clubbing cyanosis  edema Skin-warm and dry A & Oriented  Grossly normal sensory and motor function  Assessment and plan  Sinus bradycardia   Syncope   Pacemaker St Jude    Bifascicular block right bundle branch left posterior fascicular block   Atrial fibrillation    Stroke  Aortic stenosis mild  Hypertension  Anemia  Device was interrogated.  Scant atrial fibrillation in fact only about 4 hours the day she presented to the hospital.  Almost none other in the last year.  We will continue her off amiodarone.  She remains on Eliquis of 2.5 mg twice daily without clinical bleeding.  Her  blood pressure is low.  I am inclined to discontinuing the lisinopril; however, given the issues a couple months ago, her daughter-in-law is disinclined to do that and so for now we will work on trying to measure blood pressures at home.  She will get a blood pressure machine and the sitters will check it daily.  We will try and also correlate blood pressures and energy level as there seems to be quite a degree of variability in energy  Her hemoglobin is down about 2 g over the last year.  We will plan to recheck this   Current medicines are reviewed at length with the patient today .  The patient does not  have concerns regarding medicines.

## 2021-05-23 DIAGNOSIS — F0390 Unspecified dementia without behavioral disturbance: Secondary | ICD-10-CM | POA: Diagnosis not present

## 2021-05-23 DIAGNOSIS — M6281 Muscle weakness (generalized): Secondary | ICD-10-CM | POA: Diagnosis not present

## 2021-05-23 DIAGNOSIS — R2689 Other abnormalities of gait and mobility: Secondary | ICD-10-CM | POA: Diagnosis not present

## 2021-05-23 DIAGNOSIS — R279 Unspecified lack of coordination: Secondary | ICD-10-CM | POA: Diagnosis not present

## 2021-05-23 DIAGNOSIS — R2681 Unsteadiness on feet: Secondary | ICD-10-CM | POA: Diagnosis not present

## 2021-05-23 DIAGNOSIS — R41841 Cognitive communication deficit: Secondary | ICD-10-CM | POA: Diagnosis not present

## 2021-05-27 DIAGNOSIS — F0390 Unspecified dementia without behavioral disturbance: Secondary | ICD-10-CM | POA: Diagnosis not present

## 2021-05-27 DIAGNOSIS — R2681 Unsteadiness on feet: Secondary | ICD-10-CM | POA: Diagnosis not present

## 2021-05-27 DIAGNOSIS — R279 Unspecified lack of coordination: Secondary | ICD-10-CM | POA: Diagnosis not present

## 2021-05-27 DIAGNOSIS — R41841 Cognitive communication deficit: Secondary | ICD-10-CM | POA: Diagnosis not present

## 2021-05-27 DIAGNOSIS — R2689 Other abnormalities of gait and mobility: Secondary | ICD-10-CM | POA: Diagnosis not present

## 2021-05-27 DIAGNOSIS — M6281 Muscle weakness (generalized): Secondary | ICD-10-CM | POA: Diagnosis not present

## 2021-05-28 DIAGNOSIS — R279 Unspecified lack of coordination: Secondary | ICD-10-CM | POA: Diagnosis not present

## 2021-05-28 DIAGNOSIS — R41841 Cognitive communication deficit: Secondary | ICD-10-CM | POA: Diagnosis not present

## 2021-05-28 DIAGNOSIS — R2681 Unsteadiness on feet: Secondary | ICD-10-CM | POA: Diagnosis not present

## 2021-05-28 DIAGNOSIS — M6281 Muscle weakness (generalized): Secondary | ICD-10-CM | POA: Diagnosis not present

## 2021-05-28 DIAGNOSIS — F0390 Unspecified dementia without behavioral disturbance: Secondary | ICD-10-CM | POA: Diagnosis not present

## 2021-05-28 DIAGNOSIS — R2689 Other abnormalities of gait and mobility: Secondary | ICD-10-CM | POA: Diagnosis not present

## 2021-05-29 DIAGNOSIS — R2681 Unsteadiness on feet: Secondary | ICD-10-CM | POA: Diagnosis not present

## 2021-05-29 DIAGNOSIS — R279 Unspecified lack of coordination: Secondary | ICD-10-CM | POA: Diagnosis not present

## 2021-05-29 DIAGNOSIS — R2689 Other abnormalities of gait and mobility: Secondary | ICD-10-CM | POA: Diagnosis not present

## 2021-05-29 DIAGNOSIS — R41841 Cognitive communication deficit: Secondary | ICD-10-CM | POA: Diagnosis not present

## 2021-05-29 DIAGNOSIS — M6281 Muscle weakness (generalized): Secondary | ICD-10-CM | POA: Diagnosis not present

## 2021-05-29 DIAGNOSIS — F0390 Unspecified dementia without behavioral disturbance: Secondary | ICD-10-CM | POA: Diagnosis not present

## 2021-05-30 DIAGNOSIS — R2681 Unsteadiness on feet: Secondary | ICD-10-CM | POA: Diagnosis not present

## 2021-05-30 DIAGNOSIS — R41841 Cognitive communication deficit: Secondary | ICD-10-CM | POA: Diagnosis not present

## 2021-05-30 DIAGNOSIS — F0390 Unspecified dementia without behavioral disturbance: Secondary | ICD-10-CM | POA: Diagnosis not present

## 2021-05-30 DIAGNOSIS — R279 Unspecified lack of coordination: Secondary | ICD-10-CM | POA: Diagnosis not present

## 2021-05-30 DIAGNOSIS — M6281 Muscle weakness (generalized): Secondary | ICD-10-CM | POA: Diagnosis not present

## 2021-05-30 DIAGNOSIS — R2689 Other abnormalities of gait and mobility: Secondary | ICD-10-CM | POA: Diagnosis not present

## 2021-06-03 ENCOUNTER — Ambulatory Visit (INDEPENDENT_AMBULATORY_CARE_PROVIDER_SITE_OTHER): Payer: Medicare Other

## 2021-06-03 DIAGNOSIS — I441 Atrioventricular block, second degree: Secondary | ICD-10-CM | POA: Diagnosis not present

## 2021-06-03 DIAGNOSIS — R279 Unspecified lack of coordination: Secondary | ICD-10-CM | POA: Diagnosis not present

## 2021-06-03 DIAGNOSIS — F0390 Unspecified dementia without behavioral disturbance: Secondary | ICD-10-CM | POA: Diagnosis not present

## 2021-06-03 DIAGNOSIS — R2689 Other abnormalities of gait and mobility: Secondary | ICD-10-CM | POA: Diagnosis not present

## 2021-06-03 DIAGNOSIS — R2681 Unsteadiness on feet: Secondary | ICD-10-CM | POA: Diagnosis not present

## 2021-06-03 DIAGNOSIS — M6281 Muscle weakness (generalized): Secondary | ICD-10-CM | POA: Diagnosis not present

## 2021-06-03 DIAGNOSIS — R41841 Cognitive communication deficit: Secondary | ICD-10-CM | POA: Diagnosis not present

## 2021-06-03 LAB — CUP PACEART REMOTE DEVICE CHECK
Battery Remaining Longevity: 67 mo
Battery Remaining Percentage: 58 %
Battery Voltage: 3.04 V
Brady Statistic AP VP Percent: 1 %
Brady Statistic AP VS Percent: 94 %
Brady Statistic AS VP Percent: 1 %
Brady Statistic AS VS Percent: 6.2 %
Brady Statistic RA Percent Paced: 93 %
Brady Statistic RV Percent Paced: 1 %
Date Time Interrogation Session: 20220830020015
Implantable Lead Implant Date: 20180524
Implantable Lead Implant Date: 20180524
Implantable Lead Location: 753859
Implantable Lead Location: 753860
Implantable Lead Model: 3830
Implantable Pulse Generator Implant Date: 20180524
Lead Channel Impedance Value: 530 Ohm
Lead Channel Impedance Value: 550 Ohm
Lead Channel Pacing Threshold Amplitude: 0.5 V
Lead Channel Pacing Threshold Amplitude: 0.75 V
Lead Channel Pacing Threshold Pulse Width: 0.5 ms
Lead Channel Pacing Threshold Pulse Width: 0.5 ms
Lead Channel Sensing Intrinsic Amplitude: 12 mV
Lead Channel Sensing Intrinsic Amplitude: 2.4 mV
Lead Channel Setting Pacing Amplitude: 2 V
Lead Channel Setting Pacing Amplitude: 2.5 V
Lead Channel Setting Pacing Pulse Width: 0.5 ms
Lead Channel Setting Sensing Sensitivity: 2 mV
Pulse Gen Model: 2272
Pulse Gen Serial Number: 8911338

## 2021-06-04 DIAGNOSIS — R41841 Cognitive communication deficit: Secondary | ICD-10-CM | POA: Diagnosis not present

## 2021-06-04 DIAGNOSIS — R2681 Unsteadiness on feet: Secondary | ICD-10-CM | POA: Diagnosis not present

## 2021-06-04 DIAGNOSIS — F0390 Unspecified dementia without behavioral disturbance: Secondary | ICD-10-CM | POA: Diagnosis not present

## 2021-06-04 DIAGNOSIS — R279 Unspecified lack of coordination: Secondary | ICD-10-CM | POA: Diagnosis not present

## 2021-06-04 DIAGNOSIS — R2689 Other abnormalities of gait and mobility: Secondary | ICD-10-CM | POA: Diagnosis not present

## 2021-06-04 DIAGNOSIS — M6281 Muscle weakness (generalized): Secondary | ICD-10-CM | POA: Diagnosis not present

## 2021-06-05 DIAGNOSIS — R2681 Unsteadiness on feet: Secondary | ICD-10-CM | POA: Diagnosis not present

## 2021-06-05 DIAGNOSIS — R279 Unspecified lack of coordination: Secondary | ICD-10-CM | POA: Diagnosis not present

## 2021-06-05 DIAGNOSIS — Z9181 History of falling: Secondary | ICD-10-CM | POA: Diagnosis not present

## 2021-06-05 DIAGNOSIS — M6281 Muscle weakness (generalized): Secondary | ICD-10-CM | POA: Diagnosis not present

## 2021-06-05 DIAGNOSIS — I480 Paroxysmal atrial fibrillation: Secondary | ICD-10-CM | POA: Diagnosis not present

## 2021-06-05 DIAGNOSIS — R2689 Other abnormalities of gait and mobility: Secondary | ICD-10-CM | POA: Diagnosis not present

## 2021-06-05 DIAGNOSIS — F0390 Unspecified dementia without behavioral disturbance: Secondary | ICD-10-CM | POA: Diagnosis not present

## 2021-06-06 DIAGNOSIS — R279 Unspecified lack of coordination: Secondary | ICD-10-CM | POA: Diagnosis not present

## 2021-06-06 DIAGNOSIS — K449 Diaphragmatic hernia without obstruction or gangrene: Secondary | ICD-10-CM | POA: Diagnosis not present

## 2021-06-06 DIAGNOSIS — R238 Other skin changes: Secondary | ICD-10-CM | POA: Diagnosis not present

## 2021-06-06 DIAGNOSIS — R2689 Other abnormalities of gait and mobility: Secondary | ICD-10-CM | POA: Diagnosis not present

## 2021-06-06 DIAGNOSIS — M6281 Muscle weakness (generalized): Secondary | ICD-10-CM | POA: Diagnosis not present

## 2021-06-06 DIAGNOSIS — R062 Wheezing: Secondary | ICD-10-CM | POA: Diagnosis not present

## 2021-06-06 DIAGNOSIS — R0600 Dyspnea, unspecified: Secondary | ICD-10-CM | POA: Diagnosis not present

## 2021-06-06 DIAGNOSIS — I480 Paroxysmal atrial fibrillation: Secondary | ICD-10-CM | POA: Diagnosis not present

## 2021-06-06 DIAGNOSIS — Z9181 History of falling: Secondary | ICD-10-CM | POA: Diagnosis not present

## 2021-06-06 DIAGNOSIS — R2681 Unsteadiness on feet: Secondary | ICD-10-CM | POA: Diagnosis not present

## 2021-06-10 ENCOUNTER — Telehealth: Payer: Self-pay | Admitting: *Deleted

## 2021-06-10 DIAGNOSIS — Z9181 History of falling: Secondary | ICD-10-CM | POA: Diagnosis not present

## 2021-06-10 DIAGNOSIS — R2689 Other abnormalities of gait and mobility: Secondary | ICD-10-CM | POA: Diagnosis not present

## 2021-06-10 DIAGNOSIS — R2681 Unsteadiness on feet: Secondary | ICD-10-CM | POA: Diagnosis not present

## 2021-06-10 DIAGNOSIS — R279 Unspecified lack of coordination: Secondary | ICD-10-CM | POA: Diagnosis not present

## 2021-06-10 DIAGNOSIS — M6281 Muscle weakness (generalized): Secondary | ICD-10-CM | POA: Diagnosis not present

## 2021-06-10 DIAGNOSIS — I480 Paroxysmal atrial fibrillation: Secondary | ICD-10-CM | POA: Diagnosis not present

## 2021-06-10 NOTE — Telephone Encounter (Signed)
-----   Message from Emily Filbert, RN sent at 05/27/2021  1:41 PM EDT ----- Call to follow up on the how she is doing per checkout 05/22/21

## 2021-06-10 NOTE — Telephone Encounter (Signed)
The patient was seen in clinic on 05/22/21 with Dr. Caryl Comes. Per Dr. Olin Pia note: Her blood pressure is low.  I am inclined to discontinuing the lisinopril; however, given the issues a couple months ago, her daughter-in-law is disinclined to do that and so for now we will work on trying to measure blood pressures at home.  She will get a blood pressure machine and the sitters will check it daily.  We will try and also correlate blood pressures and energy level as there seems to be quite a degree of variability in energy    He had asked that I call the patient's daughter in law, Sharl Ma ~ 2 weeks post appointment to follow up on how the patient is doing/ her BP readings.  I was able to speak with [facility] today. She advised that the patient had PT on Friday and developed labored breathing. She was taken to the nurses station and checked out there. The patient was told her vital signs were normal, but her finger tips were purple. Per Sharl Ma, she is currently out of town herself, but did have a friend take the patient to the [facility] walk in clinic. The patient was again advised that everything checked out ok.   The patient's SBP are running 130-150's.  [name] advised when she is back in town at the end of this week, she will send a Pharmacist, community message of the patient's readings.  I advised [name] a mychart message would be appreciated and I can then forward to Dr. Caryl Comes to review.   [name] voices understanding and is agreeable.

## 2021-06-11 DIAGNOSIS — R2681 Unsteadiness on feet: Secondary | ICD-10-CM | POA: Diagnosis not present

## 2021-06-11 DIAGNOSIS — M6281 Muscle weakness (generalized): Secondary | ICD-10-CM | POA: Diagnosis not present

## 2021-06-11 DIAGNOSIS — I480 Paroxysmal atrial fibrillation: Secondary | ICD-10-CM | POA: Diagnosis not present

## 2021-06-11 DIAGNOSIS — R2689 Other abnormalities of gait and mobility: Secondary | ICD-10-CM | POA: Diagnosis not present

## 2021-06-11 DIAGNOSIS — Z9181 History of falling: Secondary | ICD-10-CM | POA: Diagnosis not present

## 2021-06-11 DIAGNOSIS — R279 Unspecified lack of coordination: Secondary | ICD-10-CM | POA: Diagnosis not present

## 2021-06-12 DIAGNOSIS — Z9181 History of falling: Secondary | ICD-10-CM | POA: Diagnosis not present

## 2021-06-12 DIAGNOSIS — M6281 Muscle weakness (generalized): Secondary | ICD-10-CM | POA: Diagnosis not present

## 2021-06-12 DIAGNOSIS — R279 Unspecified lack of coordination: Secondary | ICD-10-CM | POA: Diagnosis not present

## 2021-06-12 DIAGNOSIS — R2689 Other abnormalities of gait and mobility: Secondary | ICD-10-CM | POA: Diagnosis not present

## 2021-06-12 DIAGNOSIS — R2681 Unsteadiness on feet: Secondary | ICD-10-CM | POA: Diagnosis not present

## 2021-06-12 DIAGNOSIS — I480 Paroxysmal atrial fibrillation: Secondary | ICD-10-CM | POA: Diagnosis not present

## 2021-06-13 DIAGNOSIS — M6281 Muscle weakness (generalized): Secondary | ICD-10-CM | POA: Diagnosis not present

## 2021-06-13 DIAGNOSIS — Z9181 History of falling: Secondary | ICD-10-CM | POA: Diagnosis not present

## 2021-06-13 DIAGNOSIS — I480 Paroxysmal atrial fibrillation: Secondary | ICD-10-CM | POA: Diagnosis not present

## 2021-06-13 DIAGNOSIS — R279 Unspecified lack of coordination: Secondary | ICD-10-CM | POA: Diagnosis not present

## 2021-06-13 DIAGNOSIS — R2681 Unsteadiness on feet: Secondary | ICD-10-CM | POA: Diagnosis not present

## 2021-06-13 DIAGNOSIS — R2689 Other abnormalities of gait and mobility: Secondary | ICD-10-CM | POA: Diagnosis not present

## 2021-06-16 ENCOUNTER — Other Ambulatory Visit: Payer: Self-pay | Admitting: Internal Medicine

## 2021-06-16 NOTE — Progress Notes (Signed)
Remote pacemaker transmission.   

## 2021-06-17 DIAGNOSIS — R279 Unspecified lack of coordination: Secondary | ICD-10-CM | POA: Diagnosis not present

## 2021-06-17 DIAGNOSIS — M6281 Muscle weakness (generalized): Secondary | ICD-10-CM | POA: Diagnosis not present

## 2021-06-17 DIAGNOSIS — I480 Paroxysmal atrial fibrillation: Secondary | ICD-10-CM | POA: Diagnosis not present

## 2021-06-17 DIAGNOSIS — R2689 Other abnormalities of gait and mobility: Secondary | ICD-10-CM | POA: Diagnosis not present

## 2021-06-17 DIAGNOSIS — Z9181 History of falling: Secondary | ICD-10-CM | POA: Diagnosis not present

## 2021-06-17 DIAGNOSIS — R2681 Unsteadiness on feet: Secondary | ICD-10-CM | POA: Diagnosis not present

## 2021-06-19 DIAGNOSIS — Z9181 History of falling: Secondary | ICD-10-CM | POA: Diagnosis not present

## 2021-06-19 DIAGNOSIS — I480 Paroxysmal atrial fibrillation: Secondary | ICD-10-CM | POA: Diagnosis not present

## 2021-06-19 DIAGNOSIS — R2689 Other abnormalities of gait and mobility: Secondary | ICD-10-CM | POA: Diagnosis not present

## 2021-06-19 DIAGNOSIS — R2681 Unsteadiness on feet: Secondary | ICD-10-CM | POA: Diagnosis not present

## 2021-06-19 DIAGNOSIS — R279 Unspecified lack of coordination: Secondary | ICD-10-CM | POA: Diagnosis not present

## 2021-06-19 DIAGNOSIS — M6281 Muscle weakness (generalized): Secondary | ICD-10-CM | POA: Diagnosis not present

## 2021-06-20 DIAGNOSIS — R2689 Other abnormalities of gait and mobility: Secondary | ICD-10-CM | POA: Diagnosis not present

## 2021-06-20 DIAGNOSIS — M6281 Muscle weakness (generalized): Secondary | ICD-10-CM | POA: Diagnosis not present

## 2021-06-20 DIAGNOSIS — Z9181 History of falling: Secondary | ICD-10-CM | POA: Diagnosis not present

## 2021-06-20 DIAGNOSIS — R279 Unspecified lack of coordination: Secondary | ICD-10-CM | POA: Diagnosis not present

## 2021-06-20 DIAGNOSIS — R2681 Unsteadiness on feet: Secondary | ICD-10-CM | POA: Diagnosis not present

## 2021-06-20 DIAGNOSIS — I480 Paroxysmal atrial fibrillation: Secondary | ICD-10-CM | POA: Diagnosis not present

## 2021-06-24 DIAGNOSIS — Z9181 History of falling: Secondary | ICD-10-CM | POA: Diagnosis not present

## 2021-06-24 DIAGNOSIS — R279 Unspecified lack of coordination: Secondary | ICD-10-CM | POA: Diagnosis not present

## 2021-06-24 DIAGNOSIS — I480 Paroxysmal atrial fibrillation: Secondary | ICD-10-CM | POA: Diagnosis not present

## 2021-06-24 DIAGNOSIS — R2681 Unsteadiness on feet: Secondary | ICD-10-CM | POA: Diagnosis not present

## 2021-06-24 DIAGNOSIS — M6281 Muscle weakness (generalized): Secondary | ICD-10-CM | POA: Diagnosis not present

## 2021-06-24 DIAGNOSIS — R2689 Other abnormalities of gait and mobility: Secondary | ICD-10-CM | POA: Diagnosis not present

## 2021-06-26 DIAGNOSIS — R2689 Other abnormalities of gait and mobility: Secondary | ICD-10-CM | POA: Diagnosis not present

## 2021-06-26 DIAGNOSIS — Z9181 History of falling: Secondary | ICD-10-CM | POA: Diagnosis not present

## 2021-06-26 DIAGNOSIS — R279 Unspecified lack of coordination: Secondary | ICD-10-CM | POA: Diagnosis not present

## 2021-06-26 DIAGNOSIS — I480 Paroxysmal atrial fibrillation: Secondary | ICD-10-CM | POA: Diagnosis not present

## 2021-06-26 DIAGNOSIS — M6281 Muscle weakness (generalized): Secondary | ICD-10-CM | POA: Diagnosis not present

## 2021-06-26 DIAGNOSIS — R2681 Unsteadiness on feet: Secondary | ICD-10-CM | POA: Diagnosis not present

## 2021-06-27 DIAGNOSIS — I480 Paroxysmal atrial fibrillation: Secondary | ICD-10-CM | POA: Diagnosis not present

## 2021-06-27 DIAGNOSIS — R2689 Other abnormalities of gait and mobility: Secondary | ICD-10-CM | POA: Diagnosis not present

## 2021-06-27 DIAGNOSIS — R279 Unspecified lack of coordination: Secondary | ICD-10-CM | POA: Diagnosis not present

## 2021-06-27 DIAGNOSIS — Z9181 History of falling: Secondary | ICD-10-CM | POA: Diagnosis not present

## 2021-06-27 DIAGNOSIS — R2681 Unsteadiness on feet: Secondary | ICD-10-CM | POA: Diagnosis not present

## 2021-06-27 DIAGNOSIS — M6281 Muscle weakness (generalized): Secondary | ICD-10-CM | POA: Diagnosis not present

## 2021-07-01 DIAGNOSIS — M6281 Muscle weakness (generalized): Secondary | ICD-10-CM | POA: Diagnosis not present

## 2021-07-01 DIAGNOSIS — I480 Paroxysmal atrial fibrillation: Secondary | ICD-10-CM | POA: Diagnosis not present

## 2021-07-01 DIAGNOSIS — R279 Unspecified lack of coordination: Secondary | ICD-10-CM | POA: Diagnosis not present

## 2021-07-01 DIAGNOSIS — R2689 Other abnormalities of gait and mobility: Secondary | ICD-10-CM | POA: Diagnosis not present

## 2021-07-01 DIAGNOSIS — Z9181 History of falling: Secondary | ICD-10-CM | POA: Diagnosis not present

## 2021-07-01 DIAGNOSIS — R2681 Unsteadiness on feet: Secondary | ICD-10-CM | POA: Diagnosis not present

## 2021-07-02 DIAGNOSIS — Z9181 History of falling: Secondary | ICD-10-CM | POA: Diagnosis not present

## 2021-07-02 DIAGNOSIS — R2689 Other abnormalities of gait and mobility: Secondary | ICD-10-CM | POA: Diagnosis not present

## 2021-07-02 DIAGNOSIS — R2681 Unsteadiness on feet: Secondary | ICD-10-CM | POA: Diagnosis not present

## 2021-07-02 DIAGNOSIS — I480 Paroxysmal atrial fibrillation: Secondary | ICD-10-CM | POA: Diagnosis not present

## 2021-07-02 DIAGNOSIS — R279 Unspecified lack of coordination: Secondary | ICD-10-CM | POA: Diagnosis not present

## 2021-07-02 DIAGNOSIS — M6281 Muscle weakness (generalized): Secondary | ICD-10-CM | POA: Diagnosis not present

## 2021-07-03 DIAGNOSIS — M6281 Muscle weakness (generalized): Secondary | ICD-10-CM | POA: Diagnosis not present

## 2021-07-03 DIAGNOSIS — R279 Unspecified lack of coordination: Secondary | ICD-10-CM | POA: Diagnosis not present

## 2021-07-03 DIAGNOSIS — R2681 Unsteadiness on feet: Secondary | ICD-10-CM | POA: Diagnosis not present

## 2021-07-03 DIAGNOSIS — Z9181 History of falling: Secondary | ICD-10-CM | POA: Diagnosis not present

## 2021-07-03 DIAGNOSIS — R2689 Other abnormalities of gait and mobility: Secondary | ICD-10-CM | POA: Diagnosis not present

## 2021-07-03 DIAGNOSIS — I480 Paroxysmal atrial fibrillation: Secondary | ICD-10-CM | POA: Diagnosis not present

## 2021-07-08 DIAGNOSIS — F0390 Unspecified dementia without behavioral disturbance: Secondary | ICD-10-CM | POA: Diagnosis not present

## 2021-07-08 DIAGNOSIS — R2681 Unsteadiness on feet: Secondary | ICD-10-CM | POA: Diagnosis not present

## 2021-07-08 DIAGNOSIS — Z8673 Personal history of transient ischemic attack (TIA), and cerebral infarction without residual deficits: Secondary | ICD-10-CM | POA: Diagnosis not present

## 2021-07-08 DIAGNOSIS — Z9181 History of falling: Secondary | ICD-10-CM | POA: Diagnosis not present

## 2021-07-08 DIAGNOSIS — I480 Paroxysmal atrial fibrillation: Secondary | ICD-10-CM | POA: Diagnosis not present

## 2021-07-08 DIAGNOSIS — R2689 Other abnormalities of gait and mobility: Secondary | ICD-10-CM | POA: Diagnosis not present

## 2021-07-08 DIAGNOSIS — R279 Unspecified lack of coordination: Secondary | ICD-10-CM | POA: Diagnosis not present

## 2021-07-08 DIAGNOSIS — M6281 Muscle weakness (generalized): Secondary | ICD-10-CM | POA: Diagnosis not present

## 2021-07-08 DIAGNOSIS — R1312 Dysphagia, oropharyngeal phase: Secondary | ICD-10-CM | POA: Diagnosis not present

## 2021-07-09 DIAGNOSIS — R1312 Dysphagia, oropharyngeal phase: Secondary | ICD-10-CM | POA: Diagnosis not present

## 2021-07-09 DIAGNOSIS — R2689 Other abnormalities of gait and mobility: Secondary | ICD-10-CM | POA: Diagnosis not present

## 2021-07-09 DIAGNOSIS — R279 Unspecified lack of coordination: Secondary | ICD-10-CM | POA: Diagnosis not present

## 2021-07-09 DIAGNOSIS — F0390 Unspecified dementia without behavioral disturbance: Secondary | ICD-10-CM | POA: Diagnosis not present

## 2021-07-09 DIAGNOSIS — M6281 Muscle weakness (generalized): Secondary | ICD-10-CM | POA: Diagnosis not present

## 2021-07-09 DIAGNOSIS — R2681 Unsteadiness on feet: Secondary | ICD-10-CM | POA: Diagnosis not present

## 2021-07-11 DIAGNOSIS — R2681 Unsteadiness on feet: Secondary | ICD-10-CM | POA: Diagnosis not present

## 2021-07-11 DIAGNOSIS — M6281 Muscle weakness (generalized): Secondary | ICD-10-CM | POA: Diagnosis not present

## 2021-07-11 DIAGNOSIS — F0390 Unspecified dementia without behavioral disturbance: Secondary | ICD-10-CM | POA: Diagnosis not present

## 2021-07-11 DIAGNOSIS — R1312 Dysphagia, oropharyngeal phase: Secondary | ICD-10-CM | POA: Diagnosis not present

## 2021-07-11 DIAGNOSIS — R2689 Other abnormalities of gait and mobility: Secondary | ICD-10-CM | POA: Diagnosis not present

## 2021-07-11 DIAGNOSIS — R279 Unspecified lack of coordination: Secondary | ICD-10-CM | POA: Diagnosis not present

## 2021-07-14 ENCOUNTER — Other Ambulatory Visit: Payer: Self-pay

## 2021-07-14 ENCOUNTER — Ambulatory Visit (INDEPENDENT_AMBULATORY_CARE_PROVIDER_SITE_OTHER): Payer: Medicare Other | Admitting: Internal Medicine

## 2021-07-14 ENCOUNTER — Encounter: Payer: Self-pay | Admitting: Internal Medicine

## 2021-07-14 VITALS — BP 144/56 | HR 60 | Ht <= 58 in | Wt 100.3 lb

## 2021-07-14 DIAGNOSIS — E039 Hypothyroidism, unspecified: Secondary | ICD-10-CM | POA: Diagnosis not present

## 2021-07-14 DIAGNOSIS — I10 Essential (primary) hypertension: Secondary | ICD-10-CM

## 2021-07-14 DIAGNOSIS — I4891 Unspecified atrial fibrillation: Secondary | ICD-10-CM | POA: Diagnosis not present

## 2021-07-14 DIAGNOSIS — I480 Paroxysmal atrial fibrillation: Secondary | ICD-10-CM

## 2021-07-14 DIAGNOSIS — Z9181 History of falling: Secondary | ICD-10-CM

## 2021-07-14 MED ORDER — APIXABAN 2.5 MG PO TABS: 2.5000 mg | ORAL_TABLET | ORAL | 6 refills | Status: DC

## 2021-07-14 NOTE — Assessment & Plan Note (Signed)
Stable

## 2021-07-14 NOTE — Progress Notes (Signed)
Established Patient Office Visit  Subjective:  Patient ID: Kimberly Hood, female    DOB: 07-16-1926  Age: 87 y.o. MRN: [id number]  CC:  Chief Complaint  Patient presents with   Bleeding/Bruising    HPI  Kimberly Hood presents for check up, patient has a laceration on her right forearm which is bleeding  Past Medical History:  Diagnosis Date   Heart attack [facility])    Hypertension    MI (myocardial infarction) ([place]) 2009   Presence of permanent cardiac pacemaker 02/24/2017   Stroke [facility])     Past Surgical History:  Procedure Laterality Date   ABDOMINAL HYSTERECTOMY     APPENDECTOMY     PACEMAKER IMPLANT N/A 02/25/2017   Procedure: Pacemaker Implant;  Surgeon: Evans Lance, MD;  Location: [facility];  Service: Cardiovascular;  Laterality: N/A;   TONSILLECTOMY      Family History  Problem Relation Age of Onset   Diabetes Mother    Heart attack Mother    Heart attack Father    Brain cancer Sister    Hypertension Sister     Social History   Socioeconomic History   Marital status: Widowed    Spouse name: Not on file   Number of children: Not on file   Years of education: Not on file   Highest education level: Not on file  Occupational History   Not on file  Tobacco Use   Smoking status: Never   Smokeless tobacco: Never  Vaping Use   Vaping Use: Never used  Substance and Sexual Activity   Alcohol use: No   Drug use: No   Sexual activity: Not on file  Other Topics Concern   Not on file  Social History Narrative   Not on file   Social Determinants of Health   Financial Resource Strain: Not on file  Food Insecurity: Not on file  Transportation Needs: Not on file  Physical Activity: Not on file  Stress: Not on file  Social Connections: Not on file  Intimate Partner Violence: Not on file     Current Outpatient Medications:    apixaban (ELIQUIS) 2.5 MG TABS tablet, Take 1 tablet (2.5 mg total) by mouth every other day.,  Disp: 15 tablet, Rfl: 6   cholecalciferol (VITAMIN D3) 25 MCG (1000 UNIT) tablet, Take 1,000 Units by mouth at bedtime., Disp: , Rfl:    donepezil (ARICEPT) 10 MG tablet, Take 10 mg by mouth at bedtime., Disp: , Rfl:    levothyroxine (SYNTHROID) 25 MCG tablet, TAKE ONE TABLET BY MOUTH EVERY DAY BEFORE BREAKFAST, Disp: 30 tablet, Rfl: 6   lisinopril (ZESTRIL) 2.5 MG tablet, TAKE 1 TABLET BY MOUTH DAILY, Disp: 30 tablet, Rfl: 3   metoprolol tartrate (LOPRESSOR) 25 MG tablet, Take 0.5 tablets (12.5 mg total) by mouth 2 (two) times daily., Disp: 30 tablet, Rfl: 1   potassium chloride SA (KLOR-CON) 20 MEQ tablet, TAKE 1 TABLET BY MOUTH DAILY, Disp: 30 tablet, Rfl: 3   simvastatin (ZOCOR) 40 MG tablet, TAKE ONE TABLET AT BEDTIME, Disp: 30 tablet, Rfl: 6   No Known Allergies  ROS Review of Systems  Constitutional: Negative.   HENT: Negative.    Eyes: Negative.   Respiratory: Negative.    Cardiovascular: Negative.   Gastrointestinal: Negative.   Endocrine: Negative.   Genitourinary: Negative.   Musculoskeletal: Negative.   Skin: Negative.   Allergic/Immunologic: Negative.   Neurological: Negative.   Hematological: Negative.   Psychiatric/Behavioral: Negative.  All other systems reviewed and are negative.    Objective:    Physical Exam Vitals reviewed.  Constitutional:      Appearance: Normal appearance.  HENT:     Mouth/Throat:     Mouth: Mucous membranes are moist.  Eyes:     Pupils: Pupils are equal, round, and reactive to light.  Neck:     Vascular: No carotid bruit.  Cardiovascular:     Rate and Rhythm: Normal rate and regular rhythm.     Pulses: Normal pulses.     Heart sounds: Normal heart sounds.  Pulmonary:     Effort: Pulmonary effort is normal.     Breath sounds: Normal breath sounds.  Abdominal:     General: Bowel sounds are normal.     Palpations: Abdomen is soft. There is no hepatomegaly, splenomegaly or mass.     Tenderness: There is no abdominal  tenderness.     Hernia: No hernia is present.  Musculoskeletal:        General: No tenderness.     Cervical back: Neck supple.     Right lower leg: No edema.     Left lower leg: No edema.  Skin:    Findings: No rash.  Neurological:     Mental Status: She is alert and oriented to person, place, and time.     Motor: No weakness.  Psychiatric:        Mood and Affect: Mood and affect normal.        Behavior: Behavior normal.    BP (!) 144/56   Pulse 60   Ht 4' 9" (1.448 m)   Wt 100 lb 4.8 oz (45.5 kg)   BMI 21.70 kg/m  Wt Readings from Last 3 Encounters:  07/14/21 100 lb 4.8 oz (45.5 kg)  05/22/21 97 lb 6.4 oz (44.2 kg)  03/30/21 97 lb 7.1 oz (44.2 kg)     Health Maintenance Due  Topic Date Due   TETANUS/TDAP  Never done   Zoster Vaccines- Shingrix (1 of 2) Never done   DEXA SCAN  Never done   COVID-19 Vaccine (4 - Booster for Pfizer series) 10/03/2020   INFLUENZA VACCINE  05/05/2021    There are no preventive care reminders to display for this patient.  Lab Results  Component Value Date   TSH 1.930 02/18/2021   Lab Results  Component Value Date   WBC 8.9 03/31/2021   HGB 11.6 (L) 03/31/2021   HCT 35.6 (L) 03/31/2021   MCV 100.3 (H) 03/31/2021   PLT 156 03/31/2021   Lab Results  Component Value Date   NA 137 03/31/2021   K 4.5 03/31/2021   CO2 26 03/31/2021   GLUCOSE 97 03/31/2021   BUN 25 (H) 03/31/2021   CREATININE 1.04 (H) 03/31/2021   BILITOT 0.8 03/28/2021   ALKPHOS 102 03/28/2021   AST 19 03/28/2021   ALT 13 03/28/2021   PROT 6.8 03/28/2021   ALBUMIN 3.5 03/28/2021   CALCIUM 8.8 (L) 03/31/2021   ANIONGAP 6 03/31/2021   EGFR 38 (L) 02/18/2021   No results found for: CHOL No results found for: HDL No results found for: LDLCALC No results found for: TRIG No results found for: CHOLHDL Lab Results  Component Value Date   HGBA1C 6.0 08/24/2012      Assessment & Plan:   Problem List Items Addressed This Visit       Cardiovascular and  Mediastinum   PAF (paroxysmal atrial fibrillation) ([place])    It is  a chronic problem.  Because of the high risk of bleeding and her age I always the pharmacy to reduce Eliquis to every other day 2.5 mg      Relevant Medications   apixaban (ELIQUIS) 2.5 MG TABS tablet   Primary hypertension - Primary    Resolved      Relevant Medications   apixaban (ELIQUIS) 2.5 MG TABS tablet   Atrial fibrillation with RVR (HCC)    Chronic problem with vertebral-basilar artery insufficiency      Relevant Medications   apixaban (ELIQUIS) 2.5 MG TABS tablet     Endocrine   Acquired hypothyroidism    Stable        Other   At moderate risk for fall    Patient lives independently       Meds ordered this encounter  Medications   apixaban (ELIQUIS) 2.5 MG TABS tablet    Sig: Take 1 tablet (2.5 mg total) by mouth every other day.    Dispense:  15 tablet    Refill:  6  Old dressing was removed from the forearm and changed with nonstick dressing  Follow-up: No follow-ups on file.    Cletis Athens, MD

## 2021-07-14 NOTE — Assessment & Plan Note (Signed)
Patient lives independently

## 2021-07-14 NOTE — Assessment & Plan Note (Signed)
It is a chronic problem.  Because of the high risk of bleeding and her age I always the pharmacy to reduce Eliquis to every other day 2.5 mg

## 2021-07-14 NOTE — Assessment & Plan Note (Signed)
Chronic problem with vertebral-basilar artery insufficiency

## 2021-07-14 NOTE — Assessment & Plan Note (Signed)
Resolved

## 2021-07-15 ENCOUNTER — Other Ambulatory Visit: Payer: Self-pay | Admitting: Internal Medicine

## 2021-07-15 DIAGNOSIS — M6281 Muscle weakness (generalized): Secondary | ICD-10-CM | POA: Diagnosis not present

## 2021-07-15 DIAGNOSIS — R1312 Dysphagia, oropharyngeal phase: Secondary | ICD-10-CM | POA: Diagnosis not present

## 2021-07-15 DIAGNOSIS — F0390 Unspecified dementia without behavioral disturbance: Secondary | ICD-10-CM | POA: Diagnosis not present

## 2021-07-15 DIAGNOSIS — R2681 Unsteadiness on feet: Secondary | ICD-10-CM | POA: Diagnosis not present

## 2021-07-15 DIAGNOSIS — R279 Unspecified lack of coordination: Secondary | ICD-10-CM | POA: Diagnosis not present

## 2021-07-15 DIAGNOSIS — R2689 Other abnormalities of gait and mobility: Secondary | ICD-10-CM | POA: Diagnosis not present

## 2021-07-15 NOTE — Telephone Encounter (Signed)
This is a [place] pt 

## 2021-07-16 DIAGNOSIS — F0390 Unspecified dementia without behavioral disturbance: Secondary | ICD-10-CM | POA: Diagnosis not present

## 2021-07-16 DIAGNOSIS — R279 Unspecified lack of coordination: Secondary | ICD-10-CM | POA: Diagnosis not present

## 2021-07-16 DIAGNOSIS — R2681 Unsteadiness on feet: Secondary | ICD-10-CM | POA: Diagnosis not present

## 2021-07-16 DIAGNOSIS — M6281 Muscle weakness (generalized): Secondary | ICD-10-CM | POA: Diagnosis not present

## 2021-07-16 DIAGNOSIS — R2689 Other abnormalities of gait and mobility: Secondary | ICD-10-CM | POA: Diagnosis not present

## 2021-07-16 DIAGNOSIS — R1312 Dysphagia, oropharyngeal phase: Secondary | ICD-10-CM | POA: Diagnosis not present

## 2021-07-18 DIAGNOSIS — M6281 Muscle weakness (generalized): Secondary | ICD-10-CM | POA: Diagnosis not present

## 2021-07-18 DIAGNOSIS — R279 Unspecified lack of coordination: Secondary | ICD-10-CM | POA: Diagnosis not present

## 2021-07-18 DIAGNOSIS — R2689 Other abnormalities of gait and mobility: Secondary | ICD-10-CM | POA: Diagnosis not present

## 2021-07-18 DIAGNOSIS — R1312 Dysphagia, oropharyngeal phase: Secondary | ICD-10-CM | POA: Diagnosis not present

## 2021-07-18 DIAGNOSIS — F0390 Unspecified dementia without behavioral disturbance: Secondary | ICD-10-CM | POA: Diagnosis not present

## 2021-07-18 DIAGNOSIS — R2681 Unsteadiness on feet: Secondary | ICD-10-CM | POA: Diagnosis not present

## 2021-07-22 ENCOUNTER — Encounter: Payer: Self-pay | Admitting: Internal Medicine

## 2021-07-22 ENCOUNTER — Ambulatory Visit (INDEPENDENT_AMBULATORY_CARE_PROVIDER_SITE_OTHER): Payer: Medicare Other | Admitting: Internal Medicine

## 2021-07-22 ENCOUNTER — Other Ambulatory Visit: Payer: Self-pay

## 2021-07-22 VITALS — BP 145/56 | HR 60 | Ht <= 58 in | Wt 102.4 lb

## 2021-07-22 DIAGNOSIS — Z23 Encounter for immunization: Secondary | ICD-10-CM | POA: Diagnosis not present

## 2021-07-22 DIAGNOSIS — I480 Paroxysmal atrial fibrillation: Secondary | ICD-10-CM

## 2021-07-22 DIAGNOSIS — I10 Essential (primary) hypertension: Secondary | ICD-10-CM

## 2021-07-22 DIAGNOSIS — R2689 Other abnormalities of gait and mobility: Secondary | ICD-10-CM | POA: Diagnosis not present

## 2021-07-22 DIAGNOSIS — J301 Allergic rhinitis due to pollen: Secondary | ICD-10-CM | POA: Diagnosis not present

## 2021-07-22 DIAGNOSIS — R279 Unspecified lack of coordination: Secondary | ICD-10-CM | POA: Diagnosis not present

## 2021-07-22 DIAGNOSIS — M6281 Muscle weakness (generalized): Secondary | ICD-10-CM | POA: Diagnosis not present

## 2021-07-22 DIAGNOSIS — R2681 Unsteadiness on feet: Secondary | ICD-10-CM | POA: Diagnosis not present

## 2021-07-22 DIAGNOSIS — R1312 Dysphagia, oropharyngeal phase: Secondary | ICD-10-CM | POA: Diagnosis not present

## 2021-07-22 DIAGNOSIS — Z9181 History of falling: Secondary | ICD-10-CM

## 2021-07-22 DIAGNOSIS — F0390 Unspecified dementia without behavioral disturbance: Secondary | ICD-10-CM | POA: Diagnosis not present

## 2021-07-22 NOTE — Assessment & Plan Note (Signed)
Eliquis was reduced to 2.5 mg every other day because of the risk of bleeding at her age

## 2021-07-22 NOTE — Assessment & Plan Note (Signed)
Stable at the present time. 

## 2021-07-22 NOTE — Progress Notes (Signed)
Established Patient Office Visit  Subjective:  Patient ID: Kimberly Hood, female    DOB: 10/21/1925  Age: 87 y.o. MRN: [id number]  CC:  Chief Complaint  Patient presents with   PHQ-9 12 Week Follow-up   Follow-up    Patient here today for 1 week follow up for bruising     HPI  Kimberly Hood presents for dressing change in the right forearm.  Patient has a laceration of the right forearm about 2 weeks ago, wound is healing better there is no abnormal bleeding new OpSite dressing was applied  Past Medical History:  Diagnosis Date   Heart attack (HCC)    Hypertension    MI (myocardial infarction) (HCC) 2009   Presence of permanent cardiac pacemaker 02/24/2017   Stroke [facility])     Past Surgical History:  Procedure Laterality Date   ABDOMINAL HYSTERECTOMY     APPENDECTOMY     PACEMAKER IMPLANT N/A 02/25/2017   Procedure: Pacemaker Implant;  Surgeon: Marinus Maw, MD;  Location: [facility];  Service: Cardiovascular;  Laterality: N/A;   TONSILLECTOMY      Family History  Problem Relation Age of Onset   Diabetes Mother    Heart attack Mother    Heart attack Father    Brain cancer Sister    Hypertension Sister     Social History   Socioeconomic History   Marital status: Widowed    Spouse name: Not on file   Number of children: Not on file   Years of education: Not on file   Highest education level: Not on file  Occupational History   Not on file  Tobacco Use   Smoking status: Never   Smokeless tobacco: Never  Vaping Use   Vaping Use: Never used  Substance and Sexual Activity   Alcohol use: No   Drug use: No   Sexual activity: Not on file  Other Topics Concern   Not on file  Social History Narrative   Not on file   Social Determinants of Health   Financial Resource Strain: Not on file  Food Insecurity: Not on file  Transportation Needs: Not on file  Physical Activity: Not on file  Stress: Not on file  Social Connections: Not  on file  Intimate Partner Violence: Not on file     Current Outpatient Medications:    apixaban (ELIQUIS) 2.5 MG TABS tablet, Take 1 tablet (2.5 mg total) by mouth every other day., Disp: 15 tablet, Rfl: 6   cholecalciferol (VITAMIN D3) 25 MCG (1000 UNIT) tablet, Take 1,000 Units by mouth at bedtime., Disp: , Rfl:    donepezil (ARICEPT) 10 MG tablet, Take 10 mg by mouth at bedtime., Disp: , Rfl:    levothyroxine (SYNTHROID) 25 MCG tablet, TAKE ONE TABLET BY MOUTH EVERY DAY BEFORE BREAKFAST, Disp: 30 tablet, Rfl: 6   lisinopril (ZESTRIL) 2.5 MG tablet, TAKE 1 TABLET BY MOUTH DAILY, Disp: 30 tablet, Rfl: 3   metoprolol tartrate (LOPRESSOR) 25 MG tablet, TAKE ONE-HALF TABLET BY MOUTH TWICE DAILY, Disp: 30 tablet, Rfl: 4   potassium chloride SA (KLOR-CON) 20 MEQ tablet, TAKE 1 TABLET BY MOUTH DAILY, Disp: 30 tablet, Rfl: 3   simvastatin (ZOCOR) 40 MG tablet, TAKE ONE TABLET AT BEDTIME, Disp: 30 tablet, Rfl: 6   No Known Allergies  ROS Review of Systems  Constitutional: Negative.   HENT: Negative.    Eyes: Negative.   Respiratory: Negative.    Cardiovascular: Negative.   Gastrointestinal: Negative.  Endocrine: Negative.   Genitourinary: Negative.   Musculoskeletal: Negative.   Skin: Negative.   Allergic/Immunologic: Negative.   Neurological: Negative.   Hematological: Negative.   Psychiatric/Behavioral: Negative.    All other systems reviewed and are negative.    Objective:    Physical Exam Vitals reviewed.  Constitutional:      Appearance: Normal appearance.  HENT:     Mouth/Throat:     Mouth: Mucous membranes are moist.  Eyes:     Pupils: Pupils are equal, round, and reactive to light.  Neck:     Vascular: No carotid bruit.  Cardiovascular:     Rate and Rhythm: Normal rate and regular rhythm.     Pulses: Normal pulses.     Heart sounds: Normal heart sounds.  Pulmonary:     Effort: Pulmonary effort is normal.     Breath sounds: Normal breath sounds.  Abdominal:      General: Bowel sounds are normal.     Palpations: Abdomen is soft. There is no hepatomegaly, splenomegaly or mass.     Tenderness: There is no abdominal tenderness.     Hernia: No hernia is present.  Musculoskeletal:        General: No tenderness.     Cervical back: Neck supple.     Right lower leg: No edema.     Left lower leg: No edema.  Skin:    Findings: No rash.  Neurological:     Mental Status: She is alert and oriented to person, place, and time.     Motor: No weakness.  Psychiatric:        Mood and Affect: Mood and affect normal.        Behavior: Behavior normal.    BP (!) 145/56   Pulse 60   Ht 4\' 9"  (1.448 m)   Wt 102 lb 6.4 oz (46.4 kg)   BMI 22.16 kg/m  Wt Readings from Last 3 Encounters:  07/22/21 102 lb 6.4 oz (46.4 kg)  07/14/21 100 lb 4.8 oz (45.5 kg)  05/22/21 97 lb 6.4 oz (44.2 kg)     Health Maintenance Due  Topic Date Due   TETANUS/TDAP  Never done   Zoster Vaccines- Shingrix (1 of 2) Never done   DEXA SCAN  Never done   COVID-19 Vaccine (4 - Booster for Pfizer series) 10/03/2020    There are no preventive care reminders to display for this patient.  Lab Results  Component Value Date   TSH 1.930 02/18/2021   Lab Results  Component Value Date   WBC 8.9 03/31/2021   HGB 11.6 (L) 03/31/2021   HCT 35.6 (L) 03/31/2021   MCV 100.3 (H) 03/31/2021   PLT 156 03/31/2021   Lab Results  Component Value Date   NA 137 03/31/2021   K 4.5 03/31/2021   CO2 26 03/31/2021   GLUCOSE 97 03/31/2021   BUN 25 (H) 03/31/2021   CREATININE 1.04 (H) 03/31/2021   BILITOT 0.8 03/28/2021   ALKPHOS 102 03/28/2021   AST 19 03/28/2021   ALT 13 03/28/2021   PROT 6.8 03/28/2021   ALBUMIN 3.5 03/28/2021   CALCIUM 8.8 (L) 03/31/2021   ANIONGAP 6 03/31/2021   EGFR 38 (L) 02/18/2021   No results found for: CHOL No results found for: HDL No results found for: LDLCALC No results found for: TRIG No results found for: [facility] Lab Results  Component Value Date    HGBA1C 6.0 08/24/2012      Assessment & Plan:  Problem List Items Addressed This Visit       Cardiovascular and Mediastinum   PAF (paroxysmal atrial fibrillation) (HCC)    Eliquis was reduced to 2.5 mg every other day because of the risk of bleeding at her age      Primary hypertension    Stable at the present time        Respiratory   Seasonal allergic rhinitis due to pollen    Patient was advised to take Claritin as needed 10 mg p.o.        Other   At moderate risk for fall    Patient has a slow gait consistent with parkinsonian syndrome, she has bradykinesia and slow coordination      Need for influenza vaccination - Primary    She was vaccinated against flu      Relevant Orders   Flu Vaccine QUAD High Dose(Fluad) (Completed)    No orders of the defined types were placed in this encounter.   Follow-up: No follow-ups on file.    Cletis Athens, MD

## 2021-07-22 NOTE — Assessment & Plan Note (Signed)
Patient was advised to take Claritin as needed 10 mg p.o.

## 2021-07-22 NOTE — Assessment & Plan Note (Signed)
Patient has a slow gait consistent with parkinsonian syndrome, she has bradykinesia and slow coordination

## 2021-07-22 NOTE — Assessment & Plan Note (Signed)
She was vaccinated against flu

## 2021-07-23 DIAGNOSIS — R279 Unspecified lack of coordination: Secondary | ICD-10-CM | POA: Diagnosis not present

## 2021-07-23 DIAGNOSIS — R1312 Dysphagia, oropharyngeal phase: Secondary | ICD-10-CM | POA: Diagnosis not present

## 2021-07-23 DIAGNOSIS — M6281 Muscle weakness (generalized): Secondary | ICD-10-CM | POA: Diagnosis not present

## 2021-07-23 DIAGNOSIS — F0390 Unspecified dementia without behavioral disturbance: Secondary | ICD-10-CM | POA: Diagnosis not present

## 2021-07-23 DIAGNOSIS — R2681 Unsteadiness on feet: Secondary | ICD-10-CM | POA: Diagnosis not present

## 2021-07-23 DIAGNOSIS — R2689 Other abnormalities of gait and mobility: Secondary | ICD-10-CM | POA: Diagnosis not present

## 2021-07-24 DIAGNOSIS — R2689 Other abnormalities of gait and mobility: Secondary | ICD-10-CM | POA: Diagnosis not present

## 2021-07-24 DIAGNOSIS — R1312 Dysphagia, oropharyngeal phase: Secondary | ICD-10-CM | POA: Diagnosis not present

## 2021-07-24 DIAGNOSIS — R279 Unspecified lack of coordination: Secondary | ICD-10-CM | POA: Diagnosis not present

## 2021-07-24 DIAGNOSIS — F0390 Unspecified dementia without behavioral disturbance: Secondary | ICD-10-CM | POA: Diagnosis not present

## 2021-07-24 DIAGNOSIS — M6281 Muscle weakness (generalized): Secondary | ICD-10-CM | POA: Diagnosis not present

## 2021-07-24 DIAGNOSIS — R2681 Unsteadiness on feet: Secondary | ICD-10-CM | POA: Diagnosis not present

## 2021-07-25 DIAGNOSIS — F0390 Unspecified dementia without behavioral disturbance: Secondary | ICD-10-CM | POA: Diagnosis not present

## 2021-07-25 DIAGNOSIS — R2689 Other abnormalities of gait and mobility: Secondary | ICD-10-CM | POA: Diagnosis not present

## 2021-07-25 DIAGNOSIS — R2681 Unsteadiness on feet: Secondary | ICD-10-CM | POA: Diagnosis not present

## 2021-07-25 DIAGNOSIS — R1312 Dysphagia, oropharyngeal phase: Secondary | ICD-10-CM | POA: Diagnosis not present

## 2021-07-25 DIAGNOSIS — R279 Unspecified lack of coordination: Secondary | ICD-10-CM | POA: Diagnosis not present

## 2021-07-25 DIAGNOSIS — M6281 Muscle weakness (generalized): Secondary | ICD-10-CM | POA: Diagnosis not present

## 2021-07-28 DIAGNOSIS — F0390 Unspecified dementia without behavioral disturbance: Secondary | ICD-10-CM | POA: Diagnosis not present

## 2021-07-28 DIAGNOSIS — R2689 Other abnormalities of gait and mobility: Secondary | ICD-10-CM | POA: Diagnosis not present

## 2021-07-28 DIAGNOSIS — R279 Unspecified lack of coordination: Secondary | ICD-10-CM | POA: Diagnosis not present

## 2021-07-28 DIAGNOSIS — R2681 Unsteadiness on feet: Secondary | ICD-10-CM | POA: Diagnosis not present

## 2021-07-28 DIAGNOSIS — R1312 Dysphagia, oropharyngeal phase: Secondary | ICD-10-CM | POA: Diagnosis not present

## 2021-07-28 DIAGNOSIS — M6281 Muscle weakness (generalized): Secondary | ICD-10-CM | POA: Diagnosis not present

## 2021-07-29 ENCOUNTER — Ambulatory Visit (INDEPENDENT_AMBULATORY_CARE_PROVIDER_SITE_OTHER): Payer: Medicare Other | Admitting: Internal Medicine

## 2021-07-29 ENCOUNTER — Other Ambulatory Visit: Payer: Self-pay

## 2021-07-29 ENCOUNTER — Encounter: Payer: Self-pay | Admitting: Internal Medicine

## 2021-07-29 VITALS — BP 137/64 | HR 61 | Ht <= 58 in | Wt 100.7 lb

## 2021-07-29 DIAGNOSIS — R279 Unspecified lack of coordination: Secondary | ICD-10-CM | POA: Diagnosis not present

## 2021-07-29 DIAGNOSIS — F0390 Unspecified dementia without behavioral disturbance: Secondary | ICD-10-CM | POA: Diagnosis not present

## 2021-07-29 DIAGNOSIS — Z9181 History of falling: Secondary | ICD-10-CM | POA: Diagnosis not present

## 2021-07-29 DIAGNOSIS — I480 Paroxysmal atrial fibrillation: Secondary | ICD-10-CM | POA: Diagnosis not present

## 2021-07-29 DIAGNOSIS — E039 Hypothyroidism, unspecified: Secondary | ICD-10-CM

## 2021-07-29 DIAGNOSIS — R413 Other amnesia: Secondary | ICD-10-CM

## 2021-07-29 DIAGNOSIS — R2681 Unsteadiness on feet: Secondary | ICD-10-CM | POA: Diagnosis not present

## 2021-07-29 DIAGNOSIS — M6281 Muscle weakness (generalized): Secondary | ICD-10-CM | POA: Diagnosis not present

## 2021-07-29 DIAGNOSIS — R159 Full incontinence of feces: Secondary | ICD-10-CM | POA: Diagnosis not present

## 2021-07-29 DIAGNOSIS — R1312 Dysphagia, oropharyngeal phase: Secondary | ICD-10-CM | POA: Diagnosis not present

## 2021-07-29 DIAGNOSIS — R2689 Other abnormalities of gait and mobility: Secondary | ICD-10-CM | POA: Diagnosis not present

## 2021-07-29 NOTE — Assessment & Plan Note (Signed)
Stable at the present time. 

## 2021-07-29 NOTE — Progress Notes (Signed)
Established Patient Office Visit  Subjective:  Patient ID: Kimberly Hood, female    DOB: [birth year]  Age: 87 y.o. MRN: [id number]  CC:  Chief Complaint  Patient presents with   Wound Check    Recheck wound on right forearm    Wound Check   Kimberly Hood presents for dressing change and evaluation of the laceration of the right forearm.  It is healing well.  There is no sign of any infection  Past Medical History:  Diagnosis Date   Heart attack ([place])    Hypertension    MI (myocardial infarction) ([place]) 2009   Presence of permanent cardiac pacemaker 02/24/2017   Stroke [facility])     Past Surgical History:  Procedure Laterality Date   ABDOMINAL HYSTERECTOMY     APPENDECTOMY     PACEMAKER IMPLANT N/A 02/25/2017   Procedure: Pacemaker Implant;  Surgeon: Evans Lance, MD;  Location: [place] [facility];  Service: Cardiovascular;  Laterality: N/A;   TONSILLECTOMY      Family History  Problem Relation Age of Onset   Diabetes Mother    Heart attack Mother    Heart attack Father    Brain cancer Sister    Hypertension Sister     Social History   Socioeconomic History   Marital status: Widowed    Spouse name: Not on file   Number of children: Not on file   Years of education: Not on file   Highest education level: Not on file  Occupational History   Not on file  Tobacco Use   Smoking status: Never   Smokeless tobacco: Never  Vaping Use   Vaping Use: Never used  Substance and Sexual Activity   Alcohol use: No   Drug use: No   Sexual activity: Not on file  Other Topics Concern   Not on file  Social History Narrative   Not on file   Social Determinants of Health   Financial Resource Strain: Not on file  Food Insecurity: Not on file  Transportation Needs: Not on file  Physical Activity: Not on file  Stress: Not on file  Social Connections: Not on file  Intimate Partner Violence: Not on file     Current Outpatient Medications:     apixaban (ELIQUIS) 2.5 MG TABS tablet, Take 1 tablet (2.5 mg total) by mouth every other day., Disp: 15 tablet, Rfl: 6   cholecalciferol (VITAMIN D3) 25 MCG (1000 UNIT) tablet, Take 1,000 Units by mouth at bedtime., Disp: , Rfl:    donepezil (ARICEPT) 10 MG tablet, Take 10 mg by mouth at bedtime., Disp: , Rfl:    levothyroxine (SYNTHROID) 25 MCG tablet, TAKE ONE TABLET BY MOUTH EVERY DAY BEFORE BREAKFAST, Disp: 30 tablet, Rfl: 6   lisinopril (ZESTRIL) 2.5 MG tablet, TAKE 1 TABLET BY MOUTH DAILY, Disp: 30 tablet, Rfl: 3   metoprolol tartrate (LOPRESSOR) 25 MG tablet, TAKE ONE-HALF TABLET BY MOUTH TWICE DAILY, Disp: 30 tablet, Rfl: 4   potassium chloride SA (KLOR-CON) 20 MEQ tablet, TAKE 1 TABLET BY MOUTH DAILY, Disp: 30 tablet, Rfl: 3   simvastatin (ZOCOR) 40 MG tablet, TAKE ONE TABLET AT BEDTIME, Disp: 30 tablet, Rfl: 6   No Known Allergies  ROS Review of Systems  Constitutional: Negative.   HENT: Negative.    Eyes: Negative.   Respiratory: Negative.    Cardiovascular: Negative.   Gastrointestinal: Negative.   Endocrine: Negative.   Genitourinary: Negative.   Musculoskeletal: Negative.   Skin: Negative.  Allergic/Immunologic: Negative.   Neurological: Negative.   Hematological: Negative.   Psychiatric/Behavioral: Negative.    All other systems reviewed and are negative.    Objective:    Physical Exam Vitals reviewed.  Constitutional:      Appearance: Normal appearance.  HENT:     Mouth/Throat:     Mouth: Mucous membranes are moist.  Eyes:     Pupils: Pupils are equal, round, and reactive to light.  Neck:     Vascular: No carotid bruit.  Cardiovascular:     Rate and Rhythm: Normal rate and regular rhythm.     Pulses: Normal pulses.     Heart sounds: Normal heart sounds.  Pulmonary:     Effort: Pulmonary effort is normal.     Breath sounds: Normal breath sounds.  Abdominal:     General: Bowel sounds are normal.     Palpations: Abdomen is soft. There is no  hepatomegaly, splenomegaly or mass.     Tenderness: There is no abdominal tenderness.     Hernia: No hernia is present.  Musculoskeletal:        General: No tenderness.     Cervical back: Neck supple.     Right lower leg: No edema.     Left lower leg: No edema.  Skin:    Findings: No rash.  Neurological:     Mental Status: She is alert and oriented to person, place, and time.     Motor: No weakness.  Psychiatric:        Mood and Affect: Mood and affect normal.        Behavior: Behavior normal.    BP 137/64   Pulse 61   Ht _0  (1.448 m)   Wt 100 lb 11.2 oz (45.7 kg)   BMI 21.79 kg/m  Wt Readings from Last 3 Encounters:  07/29/21 100 lb 11.2 oz (45.7 kg)  07/22/21 102 lb 6.4 oz (46.4 kg)  07/14/21 100 lb 4.8 oz (45.5 kg)     Health Maintenance Due  Topic Date Due   Pneumonia Vaccine 53+ Years old (1 - PCV) Never done   TETANUS/TDAP  Never done   Zoster Vaccines- Shingrix (1 of 2) Never done   DEXA SCAN  Never done   COVID-19 Vaccine (4 - Booster for Pfizer series) 09/05/2020    There are no preventive care reminders to display for this patient.  Lab Results  Component Value Date   TSH 1.930 02/18/2021   Lab Results  Component Value Date   WBC 8.9 03/31/2021   HGB 11.6 (L) 03/31/2021   HCT 35.6 (L) 03/31/2021   MCV 100.3 (H) 03/31/2021   PLT 156 03/31/2021   Lab Results  Component Value Date   NA 137 03/31/2021   K 4.5 03/31/2021   CO2 26 03/31/2021   GLUCOSE 97 03/31/2021   BUN 25 (H) 03/31/2021   CREATININE 1.04 (H) 03/31/2021   BILITOT 0.8 03/28/2021   ALKPHOS 102 03/28/2021   AST 19 03/28/2021   ALT 13 03/28/2021   PROT 6.8 03/28/2021   ALBUMIN 3.5 03/28/2021   CALCIUM 8.8 (L) 03/31/2021   ANIONGAP 6 03/31/2021   EGFR 38 (L) 02/18/2021   No results found for: CHOL No results found for: HDL No results found for: LDLCALC No results found for: TRIG No results found for: [facility] Lab Results  Component Value Date   HGBA1C 6.0 08/24/2012       Assessment & Plan:   Problem List Items Addressed This  Visit       Cardiovascular and Mediastinum   PAF (paroxysmal atrial fibrillation) (HCC)    Stable at the present time        Endocrine   Acquired hypothyroidism    Stable at the present time        Other   At moderate risk for fall    She uses walker to walk she also get physical therapy and Occupational Therapy      Full incontinence of feces    Patient was started on FODMAP diet      Other Visit Diagnoses     Memory changes    -  Primary       No orders of the defined types were placed in this encounter.   Follow-up: No follow-ups on file.    Cletis Athens, MD

## 2021-07-29 NOTE — Assessment & Plan Note (Signed)
She uses walker to walk she also get physical therapy and Occupational Therapy

## 2021-07-29 NOTE — Assessment & Plan Note (Signed)
Patient was started on FODMAP diet

## 2021-07-30 DIAGNOSIS — R1312 Dysphagia, oropharyngeal phase: Secondary | ICD-10-CM | POA: Diagnosis not present

## 2021-07-30 DIAGNOSIS — M6281 Muscle weakness (generalized): Secondary | ICD-10-CM | POA: Diagnosis not present

## 2021-07-30 DIAGNOSIS — R2689 Other abnormalities of gait and mobility: Secondary | ICD-10-CM | POA: Diagnosis not present

## 2021-07-30 DIAGNOSIS — R279 Unspecified lack of coordination: Secondary | ICD-10-CM | POA: Diagnosis not present

## 2021-07-30 DIAGNOSIS — F0390 Unspecified dementia without behavioral disturbance: Secondary | ICD-10-CM | POA: Diagnosis not present

## 2021-07-30 DIAGNOSIS — R2681 Unsteadiness on feet: Secondary | ICD-10-CM | POA: Diagnosis not present

## 2021-07-31 DIAGNOSIS — R2681 Unsteadiness on feet: Secondary | ICD-10-CM | POA: Diagnosis not present

## 2021-07-31 DIAGNOSIS — R279 Unspecified lack of coordination: Secondary | ICD-10-CM | POA: Diagnosis not present

## 2021-07-31 DIAGNOSIS — R2689 Other abnormalities of gait and mobility: Secondary | ICD-10-CM | POA: Diagnosis not present

## 2021-07-31 DIAGNOSIS — F0390 Unspecified dementia without behavioral disturbance: Secondary | ICD-10-CM | POA: Diagnosis not present

## 2021-07-31 DIAGNOSIS — M6281 Muscle weakness (generalized): Secondary | ICD-10-CM | POA: Diagnosis not present

## 2021-07-31 DIAGNOSIS — R1312 Dysphagia, oropharyngeal phase: Secondary | ICD-10-CM | POA: Diagnosis not present

## 2021-08-01 DIAGNOSIS — R1312 Dysphagia, oropharyngeal phase: Secondary | ICD-10-CM | POA: Diagnosis not present

## 2021-08-01 DIAGNOSIS — R2689 Other abnormalities of gait and mobility: Secondary | ICD-10-CM | POA: Diagnosis not present

## 2021-08-01 DIAGNOSIS — F0390 Unspecified dementia without behavioral disturbance: Secondary | ICD-10-CM | POA: Diagnosis not present

## 2021-08-01 DIAGNOSIS — M6281 Muscle weakness (generalized): Secondary | ICD-10-CM | POA: Diagnosis not present

## 2021-08-01 DIAGNOSIS — R279 Unspecified lack of coordination: Secondary | ICD-10-CM | POA: Diagnosis not present

## 2021-08-01 DIAGNOSIS — R2681 Unsteadiness on feet: Secondary | ICD-10-CM | POA: Diagnosis not present

## 2021-08-04 DIAGNOSIS — R2689 Other abnormalities of gait and mobility: Secondary | ICD-10-CM | POA: Diagnosis not present

## 2021-08-04 DIAGNOSIS — R2681 Unsteadiness on feet: Secondary | ICD-10-CM | POA: Diagnosis not present

## 2021-08-04 DIAGNOSIS — F0390 Unspecified dementia without behavioral disturbance: Secondary | ICD-10-CM | POA: Diagnosis not present

## 2021-08-04 DIAGNOSIS — M6281 Muscle weakness (generalized): Secondary | ICD-10-CM | POA: Diagnosis not present

## 2021-08-04 DIAGNOSIS — R279 Unspecified lack of coordination: Secondary | ICD-10-CM | POA: Diagnosis not present

## 2021-08-04 DIAGNOSIS — R1312 Dysphagia, oropharyngeal phase: Secondary | ICD-10-CM | POA: Diagnosis not present

## 2021-08-05 DIAGNOSIS — Z8673 Personal history of transient ischemic attack (TIA), and cerebral infarction without residual deficits: Secondary | ICD-10-CM | POA: Diagnosis not present

## 2021-08-05 DIAGNOSIS — R279 Unspecified lack of coordination: Secondary | ICD-10-CM | POA: Diagnosis not present

## 2021-08-05 DIAGNOSIS — I480 Paroxysmal atrial fibrillation: Secondary | ICD-10-CM | POA: Diagnosis not present

## 2021-08-05 DIAGNOSIS — Z9181 History of falling: Secondary | ICD-10-CM | POA: Diagnosis not present

## 2021-08-05 DIAGNOSIS — R1312 Dysphagia, oropharyngeal phase: Secondary | ICD-10-CM | POA: Diagnosis not present

## 2021-08-05 DIAGNOSIS — R2689 Other abnormalities of gait and mobility: Secondary | ICD-10-CM | POA: Diagnosis not present

## 2021-08-05 DIAGNOSIS — M6281 Muscle weakness (generalized): Secondary | ICD-10-CM | POA: Diagnosis not present

## 2021-08-05 DIAGNOSIS — F0390 Unspecified dementia without behavioral disturbance: Secondary | ICD-10-CM | POA: Diagnosis not present

## 2021-08-05 DIAGNOSIS — R2681 Unsteadiness on feet: Secondary | ICD-10-CM | POA: Diagnosis not present

## 2021-08-06 DIAGNOSIS — R2689 Other abnormalities of gait and mobility: Secondary | ICD-10-CM | POA: Diagnosis not present

## 2021-08-06 DIAGNOSIS — F0390 Unspecified dementia without behavioral disturbance: Secondary | ICD-10-CM | POA: Diagnosis not present

## 2021-08-06 DIAGNOSIS — R279 Unspecified lack of coordination: Secondary | ICD-10-CM | POA: Diagnosis not present

## 2021-08-06 DIAGNOSIS — R1312 Dysphagia, oropharyngeal phase: Secondary | ICD-10-CM | POA: Diagnosis not present

## 2021-08-06 DIAGNOSIS — R2681 Unsteadiness on feet: Secondary | ICD-10-CM | POA: Diagnosis not present

## 2021-08-06 DIAGNOSIS — M6281 Muscle weakness (generalized): Secondary | ICD-10-CM | POA: Diagnosis not present

## 2021-08-07 DIAGNOSIS — R1312 Dysphagia, oropharyngeal phase: Secondary | ICD-10-CM | POA: Diagnosis not present

## 2021-08-07 DIAGNOSIS — M6281 Muscle weakness (generalized): Secondary | ICD-10-CM | POA: Diagnosis not present

## 2021-08-07 DIAGNOSIS — R2689 Other abnormalities of gait and mobility: Secondary | ICD-10-CM | POA: Diagnosis not present

## 2021-08-07 DIAGNOSIS — R2681 Unsteadiness on feet: Secondary | ICD-10-CM | POA: Diagnosis not present

## 2021-08-07 DIAGNOSIS — R279 Unspecified lack of coordination: Secondary | ICD-10-CM | POA: Diagnosis not present

## 2021-08-07 DIAGNOSIS — F0390 Unspecified dementia without behavioral disturbance: Secondary | ICD-10-CM | POA: Diagnosis not present

## 2021-08-08 DIAGNOSIS — R2689 Other abnormalities of gait and mobility: Secondary | ICD-10-CM | POA: Diagnosis not present

## 2021-08-08 DIAGNOSIS — R279 Unspecified lack of coordination: Secondary | ICD-10-CM | POA: Diagnosis not present

## 2021-08-08 DIAGNOSIS — F0390 Unspecified dementia without behavioral disturbance: Secondary | ICD-10-CM | POA: Diagnosis not present

## 2021-08-08 DIAGNOSIS — R1312 Dysphagia, oropharyngeal phase: Secondary | ICD-10-CM | POA: Diagnosis not present

## 2021-08-08 DIAGNOSIS — M6281 Muscle weakness (generalized): Secondary | ICD-10-CM | POA: Diagnosis not present

## 2021-08-08 DIAGNOSIS — R2681 Unsteadiness on feet: Secondary | ICD-10-CM | POA: Diagnosis not present

## 2021-08-11 DIAGNOSIS — M6281 Muscle weakness (generalized): Secondary | ICD-10-CM | POA: Diagnosis not present

## 2021-08-11 DIAGNOSIS — R279 Unspecified lack of coordination: Secondary | ICD-10-CM | POA: Diagnosis not present

## 2021-08-11 DIAGNOSIS — R2681 Unsteadiness on feet: Secondary | ICD-10-CM | POA: Diagnosis not present

## 2021-08-11 DIAGNOSIS — R1312 Dysphagia, oropharyngeal phase: Secondary | ICD-10-CM | POA: Diagnosis not present

## 2021-08-11 DIAGNOSIS — R2689 Other abnormalities of gait and mobility: Secondary | ICD-10-CM | POA: Diagnosis not present

## 2021-08-11 DIAGNOSIS — F0390 Unspecified dementia without behavioral disturbance: Secondary | ICD-10-CM | POA: Diagnosis not present

## 2021-08-12 DIAGNOSIS — R2689 Other abnormalities of gait and mobility: Secondary | ICD-10-CM | POA: Diagnosis not present

## 2021-08-12 DIAGNOSIS — F0390 Unspecified dementia without behavioral disturbance: Secondary | ICD-10-CM | POA: Diagnosis not present

## 2021-08-12 DIAGNOSIS — R1312 Dysphagia, oropharyngeal phase: Secondary | ICD-10-CM | POA: Diagnosis not present

## 2021-08-12 DIAGNOSIS — R279 Unspecified lack of coordination: Secondary | ICD-10-CM | POA: Diagnosis not present

## 2021-08-12 DIAGNOSIS — M6281 Muscle weakness (generalized): Secondary | ICD-10-CM | POA: Diagnosis not present

## 2021-08-12 DIAGNOSIS — R2681 Unsteadiness on feet: Secondary | ICD-10-CM | POA: Diagnosis not present

## 2021-08-13 DIAGNOSIS — R2681 Unsteadiness on feet: Secondary | ICD-10-CM | POA: Diagnosis not present

## 2021-08-13 DIAGNOSIS — R2689 Other abnormalities of gait and mobility: Secondary | ICD-10-CM | POA: Diagnosis not present

## 2021-08-13 DIAGNOSIS — R1312 Dysphagia, oropharyngeal phase: Secondary | ICD-10-CM | POA: Diagnosis not present

## 2021-08-13 DIAGNOSIS — R279 Unspecified lack of coordination: Secondary | ICD-10-CM | POA: Diagnosis not present

## 2021-08-13 DIAGNOSIS — F0390 Unspecified dementia without behavioral disturbance: Secondary | ICD-10-CM | POA: Diagnosis not present

## 2021-08-13 DIAGNOSIS — M6281 Muscle weakness (generalized): Secondary | ICD-10-CM | POA: Diagnosis not present

## 2021-08-14 ENCOUNTER — Ambulatory Visit (INDEPENDENT_AMBULATORY_CARE_PROVIDER_SITE_OTHER): Payer: Medicare Other | Admitting: *Deleted

## 2021-08-14 DIAGNOSIS — R279 Unspecified lack of coordination: Secondary | ICD-10-CM | POA: Diagnosis not present

## 2021-08-14 DIAGNOSIS — F0390 Unspecified dementia without behavioral disturbance: Secondary | ICD-10-CM | POA: Diagnosis not present

## 2021-08-14 DIAGNOSIS — R2681 Unsteadiness on feet: Secondary | ICD-10-CM | POA: Diagnosis not present

## 2021-08-14 DIAGNOSIS — M6281 Muscle weakness (generalized): Secondary | ICD-10-CM | POA: Diagnosis not present

## 2021-08-14 DIAGNOSIS — R1312 Dysphagia, oropharyngeal phase: Secondary | ICD-10-CM | POA: Diagnosis not present

## 2021-08-14 DIAGNOSIS — Z0000 Encounter for general adult medical examination without abnormal findings: Secondary | ICD-10-CM

## 2021-08-14 DIAGNOSIS — R2689 Other abnormalities of gait and mobility: Secondary | ICD-10-CM | POA: Diagnosis not present

## 2021-08-15 DIAGNOSIS — R1312 Dysphagia, oropharyngeal phase: Secondary | ICD-10-CM | POA: Diagnosis not present

## 2021-08-15 DIAGNOSIS — M6281 Muscle weakness (generalized): Secondary | ICD-10-CM | POA: Diagnosis not present

## 2021-08-15 DIAGNOSIS — F0390 Unspecified dementia without behavioral disturbance: Secondary | ICD-10-CM | POA: Diagnosis not present

## 2021-08-15 DIAGNOSIS — R279 Unspecified lack of coordination: Secondary | ICD-10-CM | POA: Diagnosis not present

## 2021-08-15 DIAGNOSIS — R2681 Unsteadiness on feet: Secondary | ICD-10-CM | POA: Diagnosis not present

## 2021-08-15 DIAGNOSIS — R2689 Other abnormalities of gait and mobility: Secondary | ICD-10-CM | POA: Diagnosis not present

## 2021-08-15 NOTE — Progress Notes (Signed)
Subjective:   Kimberly Hood is a 87 y.o. female who presents for Medicare Annual (Subsequent) preventive examination.  I discussed the limitations of evaluation and management by telemedicine and the availability of in person appointments. Patient expressed understanding and agreed to proceed.   Visit performed using audio  Patient:home Provider:home  Visit was done with Daughter in law   Review of Systems    Defer to provider        Objective:    There were no vitals filed for this visit. There is no height or weight on file to calculate BMI.  Advanced Directives 08/15/2021 03/28/2021 03/28/2021 03/28/2021 05/23/2018 02/25/2017 10/17/2016  Does Patient Have a Medical Advance Directive? No Yes Yes No No Yes Yes  Type of Advance Directive - Healthcare Power of [place];Living will [place];Living will  Does patient want to make changes to medical advance directive? - No - Patient declined - - - No - Patient declined No - Patient declined  Copy of [place] in Chart? - No - copy requested No - copy requested - - No - copy requested No - copy requested  Would patient like information on creating a medical advance directive? No - Patient declined No - Patient declined No - Patient declined No - Patient declined No - Patient declined - -    Current Medications (verified) Outpatient Encounter Medications as of 08/14/2021  Medication Sig   apixaban (ELIQUIS) 2.5 MG TABS tablet Take 1 tablet (2.5 mg total) by mouth every other day.   cholecalciferol (VITAMIN D3) 25 MCG (1000 UNIT) tablet Take 1,000 Units by mouth at bedtime.   donepezil (ARICEPT) 10 MG tablet Take 10 mg by mouth at bedtime.   levothyroxine (SYNTHROID) 25 MCG tablet TAKE ONE TABLET BY MOUTH EVERY DAY BEFORE BREAKFAST   lisinopril (ZESTRIL) 2.5 MG tablet TAKE 1 TABLET BY MOUTH DAILY   metoprolol tartrate (LOPRESSOR)  25 MG tablet TAKE ONE-HALF TABLET BY MOUTH TWICE DAILY   potassium chloride SA (KLOR-CON) 20 MEQ tablet TAKE 1 TABLET BY MOUTH DAILY   simvastatin (ZOCOR) 40 MG tablet TAKE ONE TABLET AT BEDTIME   No facility-administered encounter medications on file as of 08/14/2021.    Allergies (verified) Patient has no known allergies.   History: Past Medical History:  Diagnosis Date   Heart attack ([place])    Hypertension    MI (myocardial infarction) ([place]) 2009   Presence of permanent cardiac pacemaker 02/24/2017   Stroke [facility])    Past Surgical History:  Procedure Laterality Date   ABDOMINAL HYSTERECTOMY     APPENDECTOMY     PACEMAKER IMPLANT N/A 02/25/2017   Procedure: Pacemaker Implant;  Surgeon: Evans Lance, MD;  Location: [place] [facility];  Service: Cardiovascular;  Laterality: N/A;   TONSILLECTOMY     Family History  Problem Relation Age of Onset   Diabetes Mother    Heart attack Mother    Heart attack Father    Brain cancer Sister    Hypertension Sister    Social History   Socioeconomic History   Marital status: Widowed    Spouse name: Not on file   Number of children: Not on file   Years of education: Not on file   Highest education level: Not on file  Occupational History   Not on file  Tobacco Use   Smoking status: Never   Smokeless tobacco: Never  Vaping Use  Vaping Use: Never used  Substance and Sexual Activity   Alcohol use: No   Drug use: No   Sexual activity: Not on file  Other Topics Concern   Not on file  Social History Narrative   Not on file   Social Determinants of Health   Financial Resource Strain: Low Risk    Difficulty of Paying Living Expenses: Not hard at all  Food Insecurity: No Food Insecurity   Worried About Charity fundraiser in the Last Year: Never true   [place] in the Last Year: Never true  Transportation Needs: No Transportation Needs   Lack of Transportation (Medical): No   Lack of Transportation (Non-Medical): No   Physical Activity: Inactive   Days of Exercise per Week: 0 days   Minutes of Exercise per Session: 0 min  Stress: No Stress Concern Present   Feeling of Stress : Not at all  Social Connections: Moderately Isolated   Frequency of Communication with Friends and Family: More than three times a week   Frequency of Social Gatherings with Friends and Family: More than three times a week   Attends Religious Services: More than 4 times per year   Active Member of [employer] or Organizations: No   Attends Archivist Meetings: Never   Marital Status: Widowed    Tobacco Counseling Counseling given: Not Answered   Clinical Intake:  Pre-visit preparation completed: Yes  Pain : No/denies pain     Nutritional Risks: None Diabetes: No  How often do you need to have someone help you when you read instructions, pamphlets, or other written materials from your doctor or pharmacy?: 4 - Often What is the last grade level you completed in school?: 12  Diabetic?No  Interpreter Needed?: No  Information entered by :: Lacretia Nicks CMA   Activities of Daily Living In your present state of health, do you have any difficulty performing the following activities: 08/15/2021 03/28/2021  Hearing? N N  Vision? N N  Difficulty concentrating or making decisions? Tempie Donning  Walking or climbing stairs? Y Y  Dressing or bathing? N Y  Doing errands, shopping? Tempie Donning  Preparing Food and eating ? N -  Using the Toilet? N -  In the past six months, have you accidently leaked urine? N -  Do you have problems with loss of bowel control? N -  Managing your Medications? N -  Managing your Finances? N -  Housekeeping or managing your Housekeeping? N -  Some recent data might be hidden    Patient Care Team: Cletis Athens, MD as PCP - General (Internal Medicine)  Indicate any recent Medical Services you may have received from other than [facility] providers in the past year (date may be approximate).      Assessment:   This is a routine wellness examination for Kimberly Hood.  Hearing/Vision screen No results found.  Dietary issues and exercise activities discussed: Current Exercise Habits: Structured exercise class (physical therapy and ocupational therapy), Exercise limited by: orthopedic condition(s)   Goals Addressed   None    Depression Screen PHQ 2/9 Scores 08/15/2021 08/01/2020  PHQ - 2 Score 0 0    Fall Risk Fall Risk  08/15/2021 07/14/2021 08/01/2020 08/25/2018 06/01/2016  Falls in the past year? - 1 - 0 No  Comment - - - Emmi Telephone Survey: data to providers prior to load Emmi Telephone Survey: data to providers prior to load  Number falls in past yr: 1 1 0 - -  Injury with Fall? 1 1 0 - -  Risk for fall due to : History of fall(s);Impaired balance/gait;Impaired mobility;Mental status change History of fall(s) - - -  Follow up Falls evaluation completed Falls evaluation completed - - -    FALL RISK PREVENTION PERTAINING TO THE HOME:  Any stairs in or around the home? No  If so, are there any without handrails? No  Home free of loose throw rugs in walkways, pet beds, electrical cords, etc? Yes  Adequate lighting in your home to reduce risk of falls? Yes   ASSISTIVE DEVICES UTILIZED TO PREVENT FALLS:  Life alert? Yes  Use of a cane, walker or w/c? Yes  Grab bars in the bathroom? Yes  Shower chair or bench in shower? Yes  Elevated toilet seat or a handicapped toilet? Yes   TIMED UP AND GO:  Was the test performed? No .  Length of time to ambulate - NA  Gait slow and steady without use of assistive device  Cognitive Function: MMSE - Mini Mental State Exam 08/15/2021  Not completed: Unable to complete        Immunizations Immunization History  Administered Date(s) Administered   Fluad Quad(high Dose 65+) 08/01/2020, 07/22/2021   Influenza-Unspecified 05/05/2018   PFIZER(Purple Top)SARS-COV-2 Vaccination 10/11/2019, 11/08/2019, 07/11/2020    TDAP-  declined  Flu Vaccine status: Up to date  Pneumococcal vaccine status: Declined,  Education has been provided regarding the importance of this vaccine but patient still declined. Advised may receive this vaccine at local pharmacy or Health Dept. Aware to provide a copy of the vaccination record if obtained from local pharmacy or Health Dept. Verbalized acceptance and understanding.   Covid-19 vaccine status: Declined, Education has been provided regarding the importance of this vaccine but patient still declined. Advised may receive this vaccine at local pharmacy or Health Dept.or vaccine clinic. Aware to provide a copy of the vaccination record if obtained from local pharmacy or Health Dept. Verbalized acceptance and understanding.    Screening Tests Health Maintenance  Topic Date Due   COVID-19 Vaccine (4 - Booster for Pfizer series) 08/31/2021 (Originally 09/05/2020)   Zoster Vaccines- Shingrix (1 of 2) 11/15/2021 (Originally 03/21/1945)   Pneumonia Vaccine 8+ Years old (1 - PCV) 08/15/2022 (Originally 03/20/1932)   DEXA SCAN  08/15/2022 (Originally 03/20/1993)   TETANUS/TDAP  08/15/2022 (Originally 03/21/1945)   INFLUENZA VACCINE  Completed   HPV VACCINES  Aged Out    Health Maintenance  There are no preventive care reminders to display for this patient.   Colorectal cancer screening: No longer required.   Mammogram status: No longer required due to age.    Lung Cancer Screening: (Low Dose CT Chest recommended if Age 52-80 years, 30 pack-year currently smoking OR have quit w/in 15years.) does not qualify.   Lung Cancer Screening Referral: NA  Additional Screening:  Hepatitis C Screening: does not qualify; Completed NA  Vision Screening: Recommended annual ophthalmology exams for early detection of glaucoma and other disorders of the eye. Is the patient up to date with their annual eye exam?  Yes  If pt is not established with a provider, would they like to be referred to a  provider to establish care? No  Dental Screening: Recommended annual dental exams for proper oral hygiene  Community Resource Referral / Chronic Care Management: CRR required this visit?  No   CCM required this visit?  No      Plan:     I have personally reviewed and noted  the following in the patient's chart:   Medical and social history Use of alcohol, tobacco or illicit drugs  Current medications and supplements including opioid prescriptions.  Functional ability and status Nutritional status Physical activity Advanced directives List of other physicians Hospitalizations, surgeries, and ER visits in previous 12 months Vitals Screenings to include cognitive, depression, and falls Referrals and appointments  In addition, I have reviewed and discussed with patient certain preventive protocols, quality metrics, and best practice recommendations. A written personalized care plan for preventive services as well as general preventive health recommendations were provided to patient.     Lacretia Nicks, [place]   08/15/2021   Nurse Notes:  Ms. Shurtz , Thank you for taking time to come for your Medicare Wellness Visit. I appreciate your ongoing commitment to your health goals. Please review the following plan we discussed and let me know if I can assist you in the future.   These are the goals we discussed:  Goals   None     This is a list of the screening recommended for you and due dates:  Health Maintenance  Topic Date Due   COVID-19 Vaccine (4 - Booster for Pfizer series) 08/31/2021*   Zoster (Shingles) Vaccine (1 of 2) 11/15/2021*   Pneumonia Vaccine (1 - PCV) 08/15/2022*   DEXA scan (bone density measurement)  08/15/2022*   Tetanus Vaccine  08/15/2022*   Flu Shot  Completed   HPV Vaccine  Aged Out  *Topic was postponed. The date shown is not the original due date.       These are the goals we discussed:  Goals   None     This is a list of the screening  recommended for you and due dates:  Health Maintenance  Topic Date Due   COVID-19 Vaccine (4 - Booster for Pfizer series) 08/31/2021*   Zoster (Shingles) Vaccine (1 of 2) 11/15/2021*   Pneumonia Vaccine (1 - PCV) 08/15/2022*   DEXA scan (bone density measurement)  08/15/2022*   Tetanus Vaccine  08/15/2022*   Flu Shot  Completed   HPV Vaccine  Aged Out  *Topic was postponed. The date shown is not the original due date.    Time spent 20 min

## 2021-08-16 NOTE — Progress Notes (Signed)
I have reviewed this visit and agree with the documentation.   

## 2021-08-18 DIAGNOSIS — R279 Unspecified lack of coordination: Secondary | ICD-10-CM | POA: Diagnosis not present

## 2021-08-18 DIAGNOSIS — R1312 Dysphagia, oropharyngeal phase: Secondary | ICD-10-CM | POA: Diagnosis not present

## 2021-08-18 DIAGNOSIS — M6281 Muscle weakness (generalized): Secondary | ICD-10-CM | POA: Diagnosis not present

## 2021-08-18 DIAGNOSIS — R2681 Unsteadiness on feet: Secondary | ICD-10-CM | POA: Diagnosis not present

## 2021-08-18 DIAGNOSIS — R2689 Other abnormalities of gait and mobility: Secondary | ICD-10-CM | POA: Diagnosis not present

## 2021-08-18 DIAGNOSIS — F0390 Unspecified dementia without behavioral disturbance: Secondary | ICD-10-CM | POA: Diagnosis not present

## 2021-08-19 DIAGNOSIS — M6281 Muscle weakness (generalized): Secondary | ICD-10-CM | POA: Diagnosis not present

## 2021-08-19 DIAGNOSIS — R2689 Other abnormalities of gait and mobility: Secondary | ICD-10-CM | POA: Diagnosis not present

## 2021-08-19 DIAGNOSIS — R1312 Dysphagia, oropharyngeal phase: Secondary | ICD-10-CM | POA: Diagnosis not present

## 2021-08-19 DIAGNOSIS — F0390 Unspecified dementia without behavioral disturbance: Secondary | ICD-10-CM | POA: Diagnosis not present

## 2021-08-19 DIAGNOSIS — R279 Unspecified lack of coordination: Secondary | ICD-10-CM | POA: Diagnosis not present

## 2021-08-19 DIAGNOSIS — R2681 Unsteadiness on feet: Secondary | ICD-10-CM | POA: Diagnosis not present

## 2021-08-20 DIAGNOSIS — M6281 Muscle weakness (generalized): Secondary | ICD-10-CM | POA: Diagnosis not present

## 2021-08-20 DIAGNOSIS — F0390 Unspecified dementia without behavioral disturbance: Secondary | ICD-10-CM | POA: Diagnosis not present

## 2021-08-20 DIAGNOSIS — R2689 Other abnormalities of gait and mobility: Secondary | ICD-10-CM | POA: Diagnosis not present

## 2021-08-20 DIAGNOSIS — R1312 Dysphagia, oropharyngeal phase: Secondary | ICD-10-CM | POA: Diagnosis not present

## 2021-08-20 DIAGNOSIS — R279 Unspecified lack of coordination: Secondary | ICD-10-CM | POA: Diagnosis not present

## 2021-08-20 DIAGNOSIS — R2681 Unsteadiness on feet: Secondary | ICD-10-CM | POA: Diagnosis not present

## 2021-08-21 DIAGNOSIS — R2689 Other abnormalities of gait and mobility: Secondary | ICD-10-CM | POA: Diagnosis not present

## 2021-08-21 DIAGNOSIS — R2681 Unsteadiness on feet: Secondary | ICD-10-CM | POA: Diagnosis not present

## 2021-08-21 DIAGNOSIS — R279 Unspecified lack of coordination: Secondary | ICD-10-CM | POA: Diagnosis not present

## 2021-08-21 DIAGNOSIS — R1312 Dysphagia, oropharyngeal phase: Secondary | ICD-10-CM | POA: Diagnosis not present

## 2021-08-21 DIAGNOSIS — F0390 Unspecified dementia without behavioral disturbance: Secondary | ICD-10-CM | POA: Diagnosis not present

## 2021-08-21 DIAGNOSIS — M6281 Muscle weakness (generalized): Secondary | ICD-10-CM | POA: Diagnosis not present

## 2021-08-22 DIAGNOSIS — F0390 Unspecified dementia without behavioral disturbance: Secondary | ICD-10-CM | POA: Diagnosis not present

## 2021-08-22 DIAGNOSIS — R2689 Other abnormalities of gait and mobility: Secondary | ICD-10-CM | POA: Diagnosis not present

## 2021-08-22 DIAGNOSIS — R1312 Dysphagia, oropharyngeal phase: Secondary | ICD-10-CM | POA: Diagnosis not present

## 2021-08-22 DIAGNOSIS — R279 Unspecified lack of coordination: Secondary | ICD-10-CM | POA: Diagnosis not present

## 2021-08-22 DIAGNOSIS — M6281 Muscle weakness (generalized): Secondary | ICD-10-CM | POA: Diagnosis not present

## 2021-08-22 DIAGNOSIS — R2681 Unsteadiness on feet: Secondary | ICD-10-CM | POA: Diagnosis not present

## 2021-08-26 DIAGNOSIS — R2689 Other abnormalities of gait and mobility: Secondary | ICD-10-CM | POA: Diagnosis not present

## 2021-08-26 DIAGNOSIS — R1312 Dysphagia, oropharyngeal phase: Secondary | ICD-10-CM | POA: Diagnosis not present

## 2021-08-26 DIAGNOSIS — M6281 Muscle weakness (generalized): Secondary | ICD-10-CM | POA: Diagnosis not present

## 2021-08-26 DIAGNOSIS — R2681 Unsteadiness on feet: Secondary | ICD-10-CM | POA: Diagnosis not present

## 2021-08-26 DIAGNOSIS — R279 Unspecified lack of coordination: Secondary | ICD-10-CM | POA: Diagnosis not present

## 2021-08-26 DIAGNOSIS — F0390 Unspecified dementia without behavioral disturbance: Secondary | ICD-10-CM | POA: Diagnosis not present

## 2021-08-27 DIAGNOSIS — M6281 Muscle weakness (generalized): Secondary | ICD-10-CM | POA: Diagnosis not present

## 2021-08-27 DIAGNOSIS — R1312 Dysphagia, oropharyngeal phase: Secondary | ICD-10-CM | POA: Diagnosis not present

## 2021-08-27 DIAGNOSIS — R2689 Other abnormalities of gait and mobility: Secondary | ICD-10-CM | POA: Diagnosis not present

## 2021-08-27 DIAGNOSIS — F0390 Unspecified dementia without behavioral disturbance: Secondary | ICD-10-CM | POA: Diagnosis not present

## 2021-08-27 DIAGNOSIS — R2681 Unsteadiness on feet: Secondary | ICD-10-CM | POA: Diagnosis not present

## 2021-08-27 DIAGNOSIS — R279 Unspecified lack of coordination: Secondary | ICD-10-CM | POA: Diagnosis not present

## 2021-08-29 DIAGNOSIS — F0390 Unspecified dementia without behavioral disturbance: Secondary | ICD-10-CM | POA: Diagnosis not present

## 2021-08-29 DIAGNOSIS — M6281 Muscle weakness (generalized): Secondary | ICD-10-CM | POA: Diagnosis not present

## 2021-08-29 DIAGNOSIS — R1312 Dysphagia, oropharyngeal phase: Secondary | ICD-10-CM | POA: Diagnosis not present

## 2021-08-29 DIAGNOSIS — R2681 Unsteadiness on feet: Secondary | ICD-10-CM | POA: Diagnosis not present

## 2021-08-29 DIAGNOSIS — R2689 Other abnormalities of gait and mobility: Secondary | ICD-10-CM | POA: Diagnosis not present

## 2021-08-29 DIAGNOSIS — R279 Unspecified lack of coordination: Secondary | ICD-10-CM | POA: Diagnosis not present

## 2021-09-02 ENCOUNTER — Ambulatory Visit (INDEPENDENT_AMBULATORY_CARE_PROVIDER_SITE_OTHER): Payer: Medicare Other

## 2021-09-02 DIAGNOSIS — R2689 Other abnormalities of gait and mobility: Secondary | ICD-10-CM | POA: Diagnosis not present

## 2021-09-02 DIAGNOSIS — R279 Unspecified lack of coordination: Secondary | ICD-10-CM | POA: Diagnosis not present

## 2021-09-02 DIAGNOSIS — I441 Atrioventricular block, second degree: Secondary | ICD-10-CM

## 2021-09-02 DIAGNOSIS — R1312 Dysphagia, oropharyngeal phase: Secondary | ICD-10-CM | POA: Diagnosis not present

## 2021-09-02 DIAGNOSIS — M6281 Muscle weakness (generalized): Secondary | ICD-10-CM | POA: Diagnosis not present

## 2021-09-02 DIAGNOSIS — R2681 Unsteadiness on feet: Secondary | ICD-10-CM | POA: Diagnosis not present

## 2021-09-02 DIAGNOSIS — F0390 Unspecified dementia without behavioral disturbance: Secondary | ICD-10-CM | POA: Diagnosis not present

## 2021-09-02 LAB — CUP PACEART REMOTE DEVICE CHECK
Battery Remaining Longevity: 65 mo
Battery Remaining Percentage: 56 %
Battery Voltage: 3.04 V
Brady Statistic AP VP Percent: 1 %
Brady Statistic AP VS Percent: 95 %
Brady Statistic AS VP Percent: 1 %
Brady Statistic AS VS Percent: 4.8 %
Brady Statistic RA Percent Paced: 95 %
Brady Statistic RV Percent Paced: 1 %
Date Time Interrogation Session: 20221129020013
Implantable Lead Implant Date: 20180524
Implantable Lead Implant Date: 20180524
Implantable Lead Location: 753859
Implantable Lead Location: 753860
Implantable Lead Model: 3830
Implantable Pulse Generator Implant Date: 20180524
Lead Channel Impedance Value: 540 Ohm
Lead Channel Impedance Value: 540 Ohm
Lead Channel Pacing Threshold Amplitude: 0.5 V
Lead Channel Pacing Threshold Amplitude: 0.75 V
Lead Channel Pacing Threshold Pulse Width: 0.5 ms
Lead Channel Pacing Threshold Pulse Width: 0.5 ms
Lead Channel Sensing Intrinsic Amplitude: 12 mV
Lead Channel Sensing Intrinsic Amplitude: 2.1 mV
Lead Channel Setting Pacing Amplitude: 2 V
Lead Channel Setting Pacing Amplitude: 2.5 V
Lead Channel Setting Pacing Pulse Width: 0.5 ms
Lead Channel Setting Sensing Sensitivity: 2 mV
Pulse Gen Model: 2272
Pulse Gen Serial Number: 8911338

## 2021-09-03 DIAGNOSIS — R1312 Dysphagia, oropharyngeal phase: Secondary | ICD-10-CM | POA: Diagnosis not present

## 2021-09-03 DIAGNOSIS — R2689 Other abnormalities of gait and mobility: Secondary | ICD-10-CM | POA: Diagnosis not present

## 2021-09-03 DIAGNOSIS — R279 Unspecified lack of coordination: Secondary | ICD-10-CM | POA: Diagnosis not present

## 2021-09-03 DIAGNOSIS — M6281 Muscle weakness (generalized): Secondary | ICD-10-CM | POA: Diagnosis not present

## 2021-09-03 DIAGNOSIS — F0390 Unspecified dementia without behavioral disturbance: Secondary | ICD-10-CM | POA: Diagnosis not present

## 2021-09-03 DIAGNOSIS — R2681 Unsteadiness on feet: Secondary | ICD-10-CM | POA: Diagnosis not present

## 2021-09-04 DIAGNOSIS — F0390 Unspecified dementia without behavioral disturbance: Secondary | ICD-10-CM | POA: Diagnosis not present

## 2021-09-04 DIAGNOSIS — R279 Unspecified lack of coordination: Secondary | ICD-10-CM | POA: Diagnosis not present

## 2021-09-04 DIAGNOSIS — R2681 Unsteadiness on feet: Secondary | ICD-10-CM | POA: Diagnosis not present

## 2021-09-04 DIAGNOSIS — M6281 Muscle weakness (generalized): Secondary | ICD-10-CM | POA: Diagnosis not present

## 2021-09-04 DIAGNOSIS — I480 Paroxysmal atrial fibrillation: Secondary | ICD-10-CM | POA: Diagnosis not present

## 2021-09-04 DIAGNOSIS — Z9181 History of falling: Secondary | ICD-10-CM | POA: Diagnosis not present

## 2021-09-04 DIAGNOSIS — R2689 Other abnormalities of gait and mobility: Secondary | ICD-10-CM | POA: Diagnosis not present

## 2021-09-05 DIAGNOSIS — R2681 Unsteadiness on feet: Secondary | ICD-10-CM | POA: Diagnosis not present

## 2021-09-05 DIAGNOSIS — R279 Unspecified lack of coordination: Secondary | ICD-10-CM | POA: Diagnosis not present

## 2021-09-05 DIAGNOSIS — Z9181 History of falling: Secondary | ICD-10-CM | POA: Diagnosis not present

## 2021-09-05 DIAGNOSIS — R2689 Other abnormalities of gait and mobility: Secondary | ICD-10-CM | POA: Diagnosis not present

## 2021-09-05 DIAGNOSIS — I480 Paroxysmal atrial fibrillation: Secondary | ICD-10-CM | POA: Diagnosis not present

## 2021-09-05 DIAGNOSIS — M6281 Muscle weakness (generalized): Secondary | ICD-10-CM | POA: Diagnosis not present

## 2021-09-09 DIAGNOSIS — M6281 Muscle weakness (generalized): Secondary | ICD-10-CM | POA: Diagnosis not present

## 2021-09-09 DIAGNOSIS — R2689 Other abnormalities of gait and mobility: Secondary | ICD-10-CM | POA: Diagnosis not present

## 2021-09-09 DIAGNOSIS — R2681 Unsteadiness on feet: Secondary | ICD-10-CM | POA: Diagnosis not present

## 2021-09-09 DIAGNOSIS — R279 Unspecified lack of coordination: Secondary | ICD-10-CM | POA: Diagnosis not present

## 2021-09-09 DIAGNOSIS — Z9181 History of falling: Secondary | ICD-10-CM | POA: Diagnosis not present

## 2021-09-09 DIAGNOSIS — I480 Paroxysmal atrial fibrillation: Secondary | ICD-10-CM | POA: Diagnosis not present

## 2021-09-10 ENCOUNTER — Encounter: Payer: Self-pay | Admitting: Internal Medicine

## 2021-09-10 ENCOUNTER — Other Ambulatory Visit: Payer: Self-pay

## 2021-09-10 ENCOUNTER — Ambulatory Visit (INDEPENDENT_AMBULATORY_CARE_PROVIDER_SITE_OTHER): Payer: Medicare Other | Admitting: Internal Medicine

## 2021-09-10 VITALS — BP 174/64 | HR 60 | Ht <= 58 in | Wt 106.4 lb

## 2021-09-10 DIAGNOSIS — E039 Hypothyroidism, unspecified: Secondary | ICD-10-CM

## 2021-09-10 DIAGNOSIS — Z9181 History of falling: Secondary | ICD-10-CM

## 2021-09-10 DIAGNOSIS — J301 Allergic rhinitis due to pollen: Secondary | ICD-10-CM | POA: Diagnosis not present

## 2021-09-10 DIAGNOSIS — R413 Other amnesia: Secondary | ICD-10-CM | POA: Diagnosis not present

## 2021-09-10 DIAGNOSIS — I480 Paroxysmal atrial fibrillation: Secondary | ICD-10-CM | POA: Diagnosis not present

## 2021-09-10 DIAGNOSIS — I10 Essential (primary) hypertension: Secondary | ICD-10-CM

## 2021-09-10 NOTE — Assessment & Plan Note (Signed)
Stable at the present time. 

## 2021-09-10 NOTE — Assessment & Plan Note (Signed)
Stable

## 2021-09-10 NOTE — Assessment & Plan Note (Signed)
Due to dementia 

## 2021-09-10 NOTE — Assessment & Plan Note (Signed)
Under control, no syncope, patient is on Eliquis

## 2021-09-10 NOTE — Assessment & Plan Note (Signed)
Patient is getting occupational therapy

## 2021-09-10 NOTE — Progress Notes (Signed)
Established Patient Office Visit  Subjective:  Patient ID: Kimberly Hood, female    DOB: May 18, 1926  Age: 87 y.o. MRN: [id number]  CC:  Chief Complaint  Patient presents with   Follow-up    HPI  Kimberly Hood presents for check up  Past Medical History:  Diagnosis Date   Heart attack [facility])    Hypertension    MI (myocardial infarction) ([place]) 2009   Presence of permanent cardiac pacemaker 02/24/2017   Stroke [facility])     Past Surgical History:  Procedure Laterality Date   ABDOMINAL HYSTERECTOMY     APPENDECTOMY     PACEMAKER IMPLANT N/A 02/25/2017   Procedure: Pacemaker Implant;  Surgeon: Evans Lance, MD;  Location: [place] [facility];  Service: Cardiovascular;  Laterality: N/A;   TONSILLECTOMY      Family History  Problem Relation Age of Onset   Diabetes Mother    Heart attack Mother    Heart attack Father    Brain cancer Sister    Hypertension Sister     Social History   Socioeconomic History   Marital status: Widowed    Spouse name: Not on file   Number of children: Not on file   Years of education: Not on file   Highest education level: Not on file  Occupational History   Not on file  Tobacco Use   Smoking status: Never   Smokeless tobacco: Never  Vaping Use   Vaping Use: Never used  Substance and Sexual Activity   Alcohol use: No   Drug use: No   Sexual activity: Not on file  Other Topics Concern   Not on file  Social History Narrative   Not on file   Social Determinants of Health   Financial Resource Strain: Low Risk    Difficulty of Paying Living Expenses: Not hard at all  Food Insecurity: No Food Insecurity   Worried About Running Out of Food in the Last Year: Never true   [place] in the Last Year: Never true  Transportation Needs: No Transportation Needs   Lack of Transportation (Medical): No   Lack of Transportation (Non-Medical): No  Physical Activity: Inactive   Days of Exercise per Week: 0 days    Minutes of Exercise per Session: 0 min  Stress: No Stress Concern Present   Feeling of Stress : Not at all  Social Connections: Moderately Isolated   Frequency of Communication with Friends and Family: More than three times a week   Frequency of Social Gatherings with Friends and Family: More than three times a week   Attends Religious Services: More than 4 times per year   Active Member of [employer] or Organizations: No   Attends Archivist Meetings: Never   Marital Status: Widowed  Human resources officer Violence: Not At Risk   Fear of Current or Ex-Partner: No   Emotionally Abused: No   Physically Abused: No   Sexually Abused: No     Current Outpatient Medications:    apixaban (ELIQUIS) 2.5 MG TABS tablet, Take 1 tablet (2.5 mg total) by mouth every other day., Disp: 15 tablet, Rfl: 6   cholecalciferol (VITAMIN D3) 25 MCG (1000 UNIT) tablet, Take 1,000 Units by mouth at bedtime., Disp: , Rfl:    donepezil (ARICEPT) 10 MG tablet, Take 10 mg by mouth at bedtime., Disp: , Rfl:    levothyroxine (SYNTHROID) 25 MCG tablet, TAKE ONE TABLET BY MOUTH EVERY DAY BEFORE BREAKFAST, Disp: 30 tablet,  Rfl: 6   lisinopril (ZESTRIL) 2.5 MG tablet, TAKE 1 TABLET BY MOUTH DAILY, Disp: 30 tablet, Rfl: 3   metoprolol tartrate (LOPRESSOR) 25 MG tablet, TAKE ONE-HALF TABLET BY MOUTH TWICE DAILY, Disp: 30 tablet, Rfl: 4   potassium chloride SA (KLOR-CON) 20 MEQ tablet, TAKE 1 TABLET BY MOUTH DAILY, Disp: 30 tablet, Rfl: 3   simvastatin (ZOCOR) 40 MG tablet, TAKE ONE TABLET AT BEDTIME, Disp: 30 tablet, Rfl: 6   No Known Allergies  ROS Review of Systems  Constitutional: Negative.   HENT: Negative.    Eyes: Negative.   Respiratory: Negative.    Cardiovascular: Negative.   Gastrointestinal: Negative.   Endocrine: Negative.   Genitourinary: Negative.   Musculoskeletal: Negative.   Skin: Negative.   Allergic/Immunologic: Negative.   Neurological: Negative.   Hematological: Negative.    Psychiatric/Behavioral: Negative.    All other systems reviewed and are negative.    Objective:    Physical Exam Vitals reviewed.  Constitutional:      Appearance: Normal appearance.  HENT:     Mouth/Throat:     Mouth: Mucous membranes are moist.  Eyes:     Pupils: Pupils are equal, round, and reactive to light.  Neck:     Vascular: No carotid bruit.  Cardiovascular:     Rate and Rhythm: Normal rate and regular rhythm.     Pulses: Normal pulses.     Heart sounds: Normal heart sounds.  Pulmonary:     Effort: Pulmonary effort is normal.     Breath sounds: Normal breath sounds.  Abdominal:     General: Bowel sounds are normal.     Palpations: Abdomen is soft. There is no hepatomegaly, splenomegaly or mass.     Tenderness: There is no abdominal tenderness.     Hernia: No hernia is present.  Musculoskeletal:        General: No tenderness.     Cervical back: Neck supple.     Right lower leg: No edema.     Left lower leg: No edema.  Skin:    Findings: No rash.  Neurological:     Mental Status: She is alert and oriented to person, place, and time.     Motor: No weakness.  Psychiatric:        Mood and Affect: Mood and affect normal.        Behavior: Behavior normal.    BP (!) 174/64   Pulse 60   Ht _0  (1.448 m)   Wt 106 lb 6.4 oz (48.3 kg)   BMI 23.02 kg/m  Wt Readings from Last 3 Encounters:  09/10/21 106 lb 6.4 oz (48.3 kg)  07/29/21 100 lb 11.2 oz (45.7 kg)  07/22/21 102 lb 6.4 oz (46.4 kg)     Health Maintenance Due  Topic Date Due   COVID-19 Vaccine (4 - Booster for Pfizer series) 09/05/2020    There are no preventive care reminders to display for this patient.  Lab Results  Component Value Date   TSH 1.930 02/18/2021   Lab Results  Component Value Date   WBC 8.9 03/31/2021   HGB 11.6 (L) 03/31/2021   HCT 35.6 (L) 03/31/2021   MCV 100.3 (H) 03/31/2021   PLT 156 03/31/2021   Lab Results  Component Value Date   NA 137 03/31/2021   K 4.5  03/31/2021   CO2 26 03/31/2021   GLUCOSE 97 03/31/2021   BUN 25 (H) 03/31/2021   CREATININE 1.04 (H) 03/31/2021   BILITOT 0.8  03/28/2021   ALKPHOS 102 03/28/2021   AST 19 03/28/2021   ALT 13 03/28/2021   PROT 6.8 03/28/2021   ALBUMIN 3.5 03/28/2021   CALCIUM 8.8 (L) 03/31/2021   ANIONGAP 6 03/31/2021   EGFR 38 (L) 02/18/2021   No results found for: CHOL No results found for: HDL No results found for: LDLCALC No results found for: TRIG No results found for: CHOLHDL Lab Results  Component Value Date   HGBA1C 6.0 08/24/2012      Assessment & Plan:   Problem List Items Addressed This Visit       Cardiovascular and Mediastinum   PAF (paroxysmal atrial fibrillation) ([place])    Under control, no syncope, patient is on Eliquis      Primary hypertension - Primary    Stable at the present time        Respiratory   Seasonal allergic rhinitis due to pollen    Stable        Endocrine   Acquired hypothyroidism    Stable        Other   Memory change    Due to dementia      At moderate risk for fall    Patient is getting occupational therapy       No orders of the defined types were placed in this encounter.   Follow-up: No follow-ups on file.    Cletis Athens, MD

## 2021-09-11 ENCOUNTER — Encounter: Payer: Self-pay | Admitting: Internal Medicine

## 2021-09-11 DIAGNOSIS — I480 Paroxysmal atrial fibrillation: Secondary | ICD-10-CM | POA: Diagnosis not present

## 2021-09-11 DIAGNOSIS — R2681 Unsteadiness on feet: Secondary | ICD-10-CM | POA: Diagnosis not present

## 2021-09-11 DIAGNOSIS — R279 Unspecified lack of coordination: Secondary | ICD-10-CM | POA: Diagnosis not present

## 2021-09-11 DIAGNOSIS — Z9181 History of falling: Secondary | ICD-10-CM | POA: Diagnosis not present

## 2021-09-11 DIAGNOSIS — M6281 Muscle weakness (generalized): Secondary | ICD-10-CM | POA: Diagnosis not present

## 2021-09-11 DIAGNOSIS — R2689 Other abnormalities of gait and mobility: Secondary | ICD-10-CM | POA: Diagnosis not present

## 2021-09-11 NOTE — Progress Notes (Signed)
Remote pacemaker transmission.   

## 2021-09-12 DIAGNOSIS — I480 Paroxysmal atrial fibrillation: Secondary | ICD-10-CM | POA: Diagnosis not present

## 2021-09-12 DIAGNOSIS — R2681 Unsteadiness on feet: Secondary | ICD-10-CM | POA: Diagnosis not present

## 2021-09-12 DIAGNOSIS — R279 Unspecified lack of coordination: Secondary | ICD-10-CM | POA: Diagnosis not present

## 2021-09-12 DIAGNOSIS — M6281 Muscle weakness (generalized): Secondary | ICD-10-CM | POA: Diagnosis not present

## 2021-09-12 DIAGNOSIS — R2689 Other abnormalities of gait and mobility: Secondary | ICD-10-CM | POA: Diagnosis not present

## 2021-09-12 DIAGNOSIS — Z9181 History of falling: Secondary | ICD-10-CM | POA: Diagnosis not present

## 2021-09-16 DIAGNOSIS — R413 Other amnesia: Secondary | ICD-10-CM | POA: Diagnosis not present

## 2021-09-17 DIAGNOSIS — R2689 Other abnormalities of gait and mobility: Secondary | ICD-10-CM | POA: Diagnosis not present

## 2021-09-17 DIAGNOSIS — Z9181 History of falling: Secondary | ICD-10-CM | POA: Diagnosis not present

## 2021-09-17 DIAGNOSIS — I480 Paroxysmal atrial fibrillation: Secondary | ICD-10-CM | POA: Diagnosis not present

## 2021-09-17 DIAGNOSIS — M6281 Muscle weakness (generalized): Secondary | ICD-10-CM | POA: Diagnosis not present

## 2021-09-17 DIAGNOSIS — R2681 Unsteadiness on feet: Secondary | ICD-10-CM | POA: Diagnosis not present

## 2021-09-17 DIAGNOSIS — R279 Unspecified lack of coordination: Secondary | ICD-10-CM | POA: Diagnosis not present

## 2021-09-18 DIAGNOSIS — R2681 Unsteadiness on feet: Secondary | ICD-10-CM | POA: Diagnosis not present

## 2021-09-18 DIAGNOSIS — M6281 Muscle weakness (generalized): Secondary | ICD-10-CM | POA: Diagnosis not present

## 2021-09-18 DIAGNOSIS — I480 Paroxysmal atrial fibrillation: Secondary | ICD-10-CM | POA: Diagnosis not present

## 2021-09-18 DIAGNOSIS — Z9181 History of falling: Secondary | ICD-10-CM | POA: Diagnosis not present

## 2021-09-18 DIAGNOSIS — R2689 Other abnormalities of gait and mobility: Secondary | ICD-10-CM | POA: Diagnosis not present

## 2021-09-18 DIAGNOSIS — R279 Unspecified lack of coordination: Secondary | ICD-10-CM | POA: Diagnosis not present

## 2021-09-19 DIAGNOSIS — I480 Paroxysmal atrial fibrillation: Secondary | ICD-10-CM | POA: Diagnosis not present

## 2021-09-19 DIAGNOSIS — R2689 Other abnormalities of gait and mobility: Secondary | ICD-10-CM | POA: Diagnosis not present

## 2021-09-19 DIAGNOSIS — R279 Unspecified lack of coordination: Secondary | ICD-10-CM | POA: Diagnosis not present

## 2021-09-19 DIAGNOSIS — R2681 Unsteadiness on feet: Secondary | ICD-10-CM | POA: Diagnosis not present

## 2021-09-19 DIAGNOSIS — Z9181 History of falling: Secondary | ICD-10-CM | POA: Diagnosis not present

## 2021-09-19 DIAGNOSIS — M6281 Muscle weakness (generalized): Secondary | ICD-10-CM | POA: Diagnosis not present

## 2021-09-23 ENCOUNTER — Other Ambulatory Visit: Payer: Self-pay | Admitting: Internal Medicine

## 2021-09-23 DIAGNOSIS — R2681 Unsteadiness on feet: Secondary | ICD-10-CM | POA: Diagnosis not present

## 2021-09-23 DIAGNOSIS — I480 Paroxysmal atrial fibrillation: Secondary | ICD-10-CM | POA: Diagnosis not present

## 2021-09-23 DIAGNOSIS — M6281 Muscle weakness (generalized): Secondary | ICD-10-CM | POA: Diagnosis not present

## 2021-09-23 DIAGNOSIS — R2689 Other abnormalities of gait and mobility: Secondary | ICD-10-CM | POA: Diagnosis not present

## 2021-09-23 DIAGNOSIS — Z9181 History of falling: Secondary | ICD-10-CM | POA: Diagnosis not present

## 2021-09-23 DIAGNOSIS — R279 Unspecified lack of coordination: Secondary | ICD-10-CM | POA: Diagnosis not present

## 2021-09-23 NOTE — Telephone Encounter (Signed)
This is a [facility] pt 

## 2021-09-24 DIAGNOSIS — Z9181 History of falling: Secondary | ICD-10-CM | POA: Diagnosis not present

## 2021-09-24 DIAGNOSIS — R2689 Other abnormalities of gait and mobility: Secondary | ICD-10-CM | POA: Diagnosis not present

## 2021-09-24 DIAGNOSIS — R2681 Unsteadiness on feet: Secondary | ICD-10-CM | POA: Diagnosis not present

## 2021-09-24 DIAGNOSIS — R279 Unspecified lack of coordination: Secondary | ICD-10-CM | POA: Diagnosis not present

## 2021-09-24 DIAGNOSIS — M6281 Muscle weakness (generalized): Secondary | ICD-10-CM | POA: Diagnosis not present

## 2021-09-24 DIAGNOSIS — I480 Paroxysmal atrial fibrillation: Secondary | ICD-10-CM | POA: Diagnosis not present

## 2021-09-25 DIAGNOSIS — R2681 Unsteadiness on feet: Secondary | ICD-10-CM | POA: Diagnosis not present

## 2021-09-25 DIAGNOSIS — Z9181 History of falling: Secondary | ICD-10-CM | POA: Diagnosis not present

## 2021-09-25 DIAGNOSIS — I480 Paroxysmal atrial fibrillation: Secondary | ICD-10-CM | POA: Diagnosis not present

## 2021-09-25 DIAGNOSIS — M6281 Muscle weakness (generalized): Secondary | ICD-10-CM | POA: Diagnosis not present

## 2021-09-25 DIAGNOSIS — R2689 Other abnormalities of gait and mobility: Secondary | ICD-10-CM | POA: Diagnosis not present

## 2021-09-25 DIAGNOSIS — R279 Unspecified lack of coordination: Secondary | ICD-10-CM | POA: Diagnosis not present

## 2021-09-26 DIAGNOSIS — Z9181 History of falling: Secondary | ICD-10-CM | POA: Diagnosis not present

## 2021-09-26 DIAGNOSIS — M6281 Muscle weakness (generalized): Secondary | ICD-10-CM | POA: Diagnosis not present

## 2021-09-26 DIAGNOSIS — R2681 Unsteadiness on feet: Secondary | ICD-10-CM | POA: Diagnosis not present

## 2021-09-26 DIAGNOSIS — R2689 Other abnormalities of gait and mobility: Secondary | ICD-10-CM | POA: Diagnosis not present

## 2021-09-26 DIAGNOSIS — I480 Paroxysmal atrial fibrillation: Secondary | ICD-10-CM | POA: Diagnosis not present

## 2021-09-26 DIAGNOSIS — R279 Unspecified lack of coordination: Secondary | ICD-10-CM | POA: Diagnosis not present

## 2021-09-30 DIAGNOSIS — Z9181 History of falling: Secondary | ICD-10-CM | POA: Diagnosis not present

## 2021-09-30 DIAGNOSIS — M6281 Muscle weakness (generalized): Secondary | ICD-10-CM | POA: Diagnosis not present

## 2021-09-30 DIAGNOSIS — R2681 Unsteadiness on feet: Secondary | ICD-10-CM | POA: Diagnosis not present

## 2021-09-30 DIAGNOSIS — R2689 Other abnormalities of gait and mobility: Secondary | ICD-10-CM | POA: Diagnosis not present

## 2021-09-30 DIAGNOSIS — R279 Unspecified lack of coordination: Secondary | ICD-10-CM | POA: Diagnosis not present

## 2021-09-30 DIAGNOSIS — I480 Paroxysmal atrial fibrillation: Secondary | ICD-10-CM | POA: Diagnosis not present

## 2021-10-01 DIAGNOSIS — R2689 Other abnormalities of gait and mobility: Secondary | ICD-10-CM | POA: Diagnosis not present

## 2021-10-01 DIAGNOSIS — R2681 Unsteadiness on feet: Secondary | ICD-10-CM | POA: Diagnosis not present

## 2021-10-01 DIAGNOSIS — I480 Paroxysmal atrial fibrillation: Secondary | ICD-10-CM | POA: Diagnosis not present

## 2021-10-01 DIAGNOSIS — R279 Unspecified lack of coordination: Secondary | ICD-10-CM | POA: Diagnosis not present

## 2021-10-01 DIAGNOSIS — Z9181 History of falling: Secondary | ICD-10-CM | POA: Diagnosis not present

## 2021-10-01 DIAGNOSIS — M6281 Muscle weakness (generalized): Secondary | ICD-10-CM | POA: Diagnosis not present

## 2021-10-02 DIAGNOSIS — R2689 Other abnormalities of gait and mobility: Secondary | ICD-10-CM | POA: Diagnosis not present

## 2021-10-02 DIAGNOSIS — M6281 Muscle weakness (generalized): Secondary | ICD-10-CM | POA: Diagnosis not present

## 2021-10-02 DIAGNOSIS — Z9181 History of falling: Secondary | ICD-10-CM | POA: Diagnosis not present

## 2021-10-02 DIAGNOSIS — R2681 Unsteadiness on feet: Secondary | ICD-10-CM | POA: Diagnosis not present

## 2021-10-02 DIAGNOSIS — R279 Unspecified lack of coordination: Secondary | ICD-10-CM | POA: Diagnosis not present

## 2021-10-02 DIAGNOSIS — I480 Paroxysmal atrial fibrillation: Secondary | ICD-10-CM | POA: Diagnosis not present

## 2021-10-03 DIAGNOSIS — Z9181 History of falling: Secondary | ICD-10-CM | POA: Diagnosis not present

## 2021-10-03 DIAGNOSIS — R2681 Unsteadiness on feet: Secondary | ICD-10-CM | POA: Diagnosis not present

## 2021-10-03 DIAGNOSIS — R2689 Other abnormalities of gait and mobility: Secondary | ICD-10-CM | POA: Diagnosis not present

## 2021-10-03 DIAGNOSIS — I480 Paroxysmal atrial fibrillation: Secondary | ICD-10-CM | POA: Diagnosis not present

## 2021-10-03 DIAGNOSIS — M6281 Muscle weakness (generalized): Secondary | ICD-10-CM | POA: Diagnosis not present

## 2021-10-03 DIAGNOSIS — R279 Unspecified lack of coordination: Secondary | ICD-10-CM | POA: Diagnosis not present

## 2021-10-07 DIAGNOSIS — I480 Paroxysmal atrial fibrillation: Secondary | ICD-10-CM | POA: Diagnosis not present

## 2021-10-07 DIAGNOSIS — R279 Unspecified lack of coordination: Secondary | ICD-10-CM | POA: Diagnosis not present

## 2021-10-07 DIAGNOSIS — R2681 Unsteadiness on feet: Secondary | ICD-10-CM | POA: Diagnosis not present

## 2021-10-07 DIAGNOSIS — Z9181 History of falling: Secondary | ICD-10-CM | POA: Diagnosis not present

## 2021-10-07 DIAGNOSIS — R2689 Other abnormalities of gait and mobility: Secondary | ICD-10-CM | POA: Diagnosis not present

## 2021-10-07 DIAGNOSIS — M6281 Muscle weakness (generalized): Secondary | ICD-10-CM | POA: Diagnosis not present

## 2021-10-07 DIAGNOSIS — F0390 Unspecified dementia without behavioral disturbance: Secondary | ICD-10-CM | POA: Diagnosis not present

## 2021-10-08 DIAGNOSIS — R2689 Other abnormalities of gait and mobility: Secondary | ICD-10-CM | POA: Diagnosis not present

## 2021-10-08 DIAGNOSIS — R279 Unspecified lack of coordination: Secondary | ICD-10-CM | POA: Diagnosis not present

## 2021-10-08 DIAGNOSIS — M6281 Muscle weakness (generalized): Secondary | ICD-10-CM | POA: Diagnosis not present

## 2021-10-08 DIAGNOSIS — Z9181 History of falling: Secondary | ICD-10-CM | POA: Diagnosis not present

## 2021-10-08 DIAGNOSIS — I480 Paroxysmal atrial fibrillation: Secondary | ICD-10-CM | POA: Diagnosis not present

## 2021-10-08 DIAGNOSIS — R2681 Unsteadiness on feet: Secondary | ICD-10-CM | POA: Diagnosis not present

## 2021-10-09 DIAGNOSIS — R2689 Other abnormalities of gait and mobility: Secondary | ICD-10-CM | POA: Diagnosis not present

## 2021-10-09 DIAGNOSIS — R2681 Unsteadiness on feet: Secondary | ICD-10-CM | POA: Diagnosis not present

## 2021-10-09 DIAGNOSIS — R279 Unspecified lack of coordination: Secondary | ICD-10-CM | POA: Diagnosis not present

## 2021-10-09 DIAGNOSIS — M6281 Muscle weakness (generalized): Secondary | ICD-10-CM | POA: Diagnosis not present

## 2021-10-09 DIAGNOSIS — Z9181 History of falling: Secondary | ICD-10-CM | POA: Diagnosis not present

## 2021-10-09 DIAGNOSIS — I480 Paroxysmal atrial fibrillation: Secondary | ICD-10-CM | POA: Diagnosis not present

## 2021-10-24 ENCOUNTER — Other Ambulatory Visit: Payer: Self-pay | Admitting: Internal Medicine

## 2021-11-04 ENCOUNTER — Encounter: Payer: Self-pay | Admitting: Internal Medicine

## 2021-11-04 ENCOUNTER — Ambulatory Visit (INDEPENDENT_AMBULATORY_CARE_PROVIDER_SITE_OTHER): Payer: Medicare Other | Admitting: Internal Medicine

## 2021-11-04 ENCOUNTER — Other Ambulatory Visit
Admission: RE | Admit: 2021-11-04 | Discharge: 2021-11-04 | Disposition: A | Discharge status: Home / Self Care | Payer: Medicare Other | Attending: Internal Medicine | Admitting: Internal Medicine

## 2021-11-04 ENCOUNTER — Other Ambulatory Visit: Payer: Self-pay

## 2021-11-04 VITALS — BP 136/72 | HR 60 | Ht <= 58 in | Wt 102.0 lb

## 2021-11-04 DIAGNOSIS — Z950 Presence of cardiac pacemaker: Secondary | ICD-10-CM

## 2021-11-04 DIAGNOSIS — I480 Paroxysmal atrial fibrillation: Secondary | ICD-10-CM | POA: Diagnosis not present

## 2021-11-04 DIAGNOSIS — I441 Atrioventricular block, second degree: Secondary | ICD-10-CM

## 2021-11-04 DIAGNOSIS — Z79899 Other long term (current) drug therapy: Secondary | ICD-10-CM | POA: Insufficient documentation

## 2021-11-04 LAB — CBC
HCT: 40.7 % (ref 36.0–46.0)
Hemoglobin: 13.3 g/dL (ref 12.0–15.0)
MCH: 32.2 pg (ref 26.0–34.0)
MCHC: 32.7 g/dL (ref 30.0–36.0)
MCV: 98.5 fL (ref 80.0–100.0)
Platelets: 174 10*3/uL (ref 150–400)
RBC: 4.13 MIL/uL (ref 3.87–5.11)
RDW: 13.5 % (ref 11.5–15.5)
WBC: 10.8 10*3/uL — ABNORMAL HIGH (ref 4.0–10.5)
nRBC: 0 % (ref 0.0–0.2)

## 2021-11-04 LAB — PACEMAKER DEVICE OBSERVATION

## 2021-11-04 NOTE — Progress Notes (Signed)
Patient Care Team: Cletis Athens, MD as PCP - General (Internal Medicine)   HPI  Kimberly Hood is a 88 y.o. female Seen in followup for pacemaker (St Jude  His Bundle)  Implanted (GT)  for syncope with bifascicular block (5/18) Prior hx of reported AFib with CVA previously on warfarin now Eliquis and is without bleeding.      After her last office visit, 5/22 in the absence of interval atrial fibrillation/flutter her amiodarone was discontinued.  And with hypotension, blood pressures 92/62, lisinopril was discontinued.  She ended up in the hospital 6/22 with atrial fibrillation and hypertension.  Eliquis was continued.  Rate control was accomplished with beta-blockers.  And lisinopril was resumed.  I had spoken with the daughter-in-law after the aforementioned events as they were concerned about the lack of follow-up following the discontinuation of these medications.  DIL also concerned of weight loss, but there has been recent rebound    The patient denies chest pain, shortness of breath, nocturnal dyspnea, orthopnea or peripheral edema.  There have been no palpitations, lightheadedness or syncope.    Will be moving to assisted living, "I am ready "  DATE TEST EF   5/18 Echo   60-65 % AS mild (mean grad 17)  6/22 Echo   60-65 % AS mild (mean grad 16)                Date Cr K Hgb WBC  5/18 1.19 4.6 13.9   6/19     9.3  6/21 1.71  13.8 13.3  6/22 1.04 4.5 11.6 10.5        Past Medical History:  Diagnosis Date   Heart attack ([place])    Hypertension    MI (myocardial infarction) ([place]) 2009   Presence of permanent cardiac pacemaker 02/24/2017   Stroke [facility])     Past Surgical History:  Procedure Laterality Date   ABDOMINAL HYSTERECTOMY     APPENDECTOMY     PACEMAKER IMPLANT N/A 02/25/2017   Procedure: Pacemaker Implant;  Surgeon: Evans Lance, MD;  Location: [place] [facility];  Service: Cardiovascular;  Laterality: N/A;   TONSILLECTOMY      Current  Outpatient Medications  Medication Sig Dispense Refill   apixaban (ELIQUIS) 2.5 MG TABS tablet Take 1 tablet (2.5 mg total) by mouth every other day. 15 tablet 6   cholecalciferol (VITAMIN D3) 25 MCG (1000 UNIT) tablet Take 1,000 Units by mouth at bedtime.     donepezil (ARICEPT) 10 MG tablet Take 10 mg by mouth at bedtime.     levothyroxine (SYNTHROID) 25 MCG tablet TAKE ONE TABLET (25 MCG) BY MOUTH EVERY DAY BEFORE BREAKFAST 30 tablet 6   lisinopril (ZESTRIL) 2.5 MG tablet TAKE 1 TABLET BY MOUTH DAILY 30 tablet 3   metoprolol tartrate (LOPRESSOR) 25 MG tablet TAKE ONE-HALF TABLET BY MOUTH TWICE DAILY 30 tablet 4   potassium chloride SA (KLOR-CON) 20 MEQ tablet TAKE 1 TABLET BY MOUTH DAILY 30 tablet 3   simvastatin (ZOCOR) 40 MG tablet TAKE ONE TABLET AT BEDTIME 30 tablet 6   No current facility-administered medications for this visit.    No Known Allergies    Review of Systems negative except from HPI and PMH  Physical Exam\ BP 136/72 (BP Location: Left Arm, Patient Position: Sitting, Cuff Size: Normal)    Pulse 60    Ht 4\' 9"  (1.448 m)    Wt 102 lb (46.3 kg)    SpO2 99%  BMI 22.07 kg/m  Well developed and cachectic  in no acute distress HENT normal Neck supple with JVP-flat Clear Device pocket well healed; without hematoma or erythema.  There is no tethering  Regular rate and rhythm, no gallop 2/6 murmur Abd-soft with active BS No Clubbing cyanosis  edema Skin-warm and dry A & Oriented  Grossly normal sensory and motor function  ECG atrial pacing at 60 Intervals 23/07/42   Assessment and plan  Sinus bradycardia   Syncope   Pacemaker St Jude    Bifascicular block right bundle branch left posterior fascicular block-intermittent   Atrial fibrillation    Stroke  Aortic stenosis mild  Hypertension  Anemia  No intercurrent atrial fibrillation or flutter; continue metoprolol 12.5 twice daily  On Anticoagulant for thromboembolic risk reduction.  No bleeding  issues.  Continue apixaban 2.5 mg twice daily    Blood pressure reasonably controlled.  Between 110s and 160s.  Continue the lisinopril 2.5 and metoprolol 12.5 twice daily  No interval syncope  Last hemoglobin had decreased from 13--11 (6/22) we will recheck

## 2021-11-04 NOTE — Addendum Note (Signed)
Addended by: Antonieta Iba C on: 11/04/2021 12:07 PM   Modules accepted: Orders

## 2021-11-04 NOTE — Patient Instructions (Addendum)
Medication Instructions:  Your physician recommends that you continue on your current medications as directed. Please refer to the Current Medication list given to you today.  *If you need a refill on your cardiac medications before your next appointment, please call your pharmacy*   Lab Work: CBC today  If you have labs (blood work) drawn today and your tests are completely normal, you will receive your results only by: [place] (if you have MyChart) OR A paper copy in the mail If you have any lab test that is abnormal or we need to change your treatment, we will call you to review the results.   Testing/Procedures: None ordered.    Follow-Up: At [facility], you and your health needs are our priority.  As part of our continuing mission to provide you with exceptional heart care, we have created designated Provider Care Teams.  These Care Teams include your primary Cardiologist (physician) and Advanced Practice Providers (APPs -  Physician Assistants and Nurse Practitioners) who all work together to provide you with the care you need, when you need it.  We recommend signing up for the patient portal called "MyChart".  Sign up information is provided on this After Visit Summary.  MyChart is used to connect with patients for Virtual Visits (Telemedicine).  Patients are able to view lab/test results, encounter notes, upcoming appointments, etc.  Non-urgent messages can be sent to your provider as well.   To learn more about what you can do with MyChart, go to [email or web address].    Your next appointment:   12 months with Dr Caryl Comes

## 2021-11-10 DIAGNOSIS — M6281 Muscle weakness (generalized): Secondary | ICD-10-CM | POA: Diagnosis not present

## 2021-11-10 DIAGNOSIS — F0390 Unspecified dementia without behavioral disturbance: Secondary | ICD-10-CM | POA: Diagnosis not present

## 2021-11-10 DIAGNOSIS — I25119 Atherosclerotic heart disease of native coronary artery with unspecified angina pectoris: Secondary | ICD-10-CM | POA: Diagnosis not present

## 2021-11-10 DIAGNOSIS — Z950 Presence of cardiac pacemaker: Secondary | ICD-10-CM | POA: Diagnosis not present

## 2021-11-10 DIAGNOSIS — Z8673 Personal history of transient ischemic attack (TIA), and cerebral infarction without residual deficits: Secondary | ICD-10-CM | POA: Diagnosis not present

## 2021-11-10 DIAGNOSIS — E7800 Pure hypercholesterolemia, unspecified: Secondary | ICD-10-CM | POA: Diagnosis not present

## 2021-11-10 DIAGNOSIS — I480 Paroxysmal atrial fibrillation: Secondary | ICD-10-CM | POA: Diagnosis not present

## 2021-11-10 DIAGNOSIS — R2689 Other abnormalities of gait and mobility: Secondary | ICD-10-CM | POA: Diagnosis not present

## 2021-11-10 DIAGNOSIS — R262 Difficulty in walking, not elsewhere classified: Secondary | ICD-10-CM | POA: Diagnosis not present

## 2021-11-10 DIAGNOSIS — R1312 Dysphagia, oropharyngeal phase: Secondary | ICD-10-CM | POA: Diagnosis not present

## 2021-11-10 DIAGNOSIS — R41841 Cognitive communication deficit: Secondary | ICD-10-CM | POA: Diagnosis not present

## 2021-11-10 DIAGNOSIS — R279 Unspecified lack of coordination: Secondary | ICD-10-CM | POA: Diagnosis not present

## 2021-11-10 DIAGNOSIS — I10 Essential (primary) hypertension: Secondary | ICD-10-CM | POA: Diagnosis not present

## 2021-11-10 DIAGNOSIS — E039 Hypothyroidism, unspecified: Secondary | ICD-10-CM | POA: Diagnosis not present

## 2021-11-10 DIAGNOSIS — Z9181 History of falling: Secondary | ICD-10-CM | POA: Diagnosis not present

## 2021-11-11 ENCOUNTER — Ambulatory Visit: Payer: Medicare Other | Admitting: Internal Medicine

## 2021-11-11 DIAGNOSIS — F0390 Unspecified dementia without behavioral disturbance: Secondary | ICD-10-CM | POA: Diagnosis not present

## 2021-11-11 DIAGNOSIS — I25119 Atherosclerotic heart disease of native coronary artery with unspecified angina pectoris: Secondary | ICD-10-CM | POA: Diagnosis not present

## 2021-11-11 DIAGNOSIS — I480 Paroxysmal atrial fibrillation: Secondary | ICD-10-CM | POA: Diagnosis not present

## 2021-11-11 DIAGNOSIS — R41841 Cognitive communication deficit: Secondary | ICD-10-CM | POA: Diagnosis not present

## 2021-11-11 DIAGNOSIS — R1312 Dysphagia, oropharyngeal phase: Secondary | ICD-10-CM | POA: Diagnosis not present

## 2021-11-11 DIAGNOSIS — I10 Essential (primary) hypertension: Secondary | ICD-10-CM | POA: Diagnosis not present

## 2021-11-12 DIAGNOSIS — R1312 Dysphagia, oropharyngeal phase: Secondary | ICD-10-CM | POA: Diagnosis not present

## 2021-11-12 DIAGNOSIS — I10 Essential (primary) hypertension: Secondary | ICD-10-CM | POA: Diagnosis not present

## 2021-11-12 DIAGNOSIS — I25119 Atherosclerotic heart disease of native coronary artery with unspecified angina pectoris: Secondary | ICD-10-CM | POA: Diagnosis not present

## 2021-11-12 DIAGNOSIS — R41841 Cognitive communication deficit: Secondary | ICD-10-CM | POA: Diagnosis not present

## 2021-11-12 DIAGNOSIS — I480 Paroxysmal atrial fibrillation: Secondary | ICD-10-CM | POA: Diagnosis not present

## 2021-11-12 DIAGNOSIS — F0390 Unspecified dementia without behavioral disturbance: Secondary | ICD-10-CM | POA: Diagnosis not present

## 2021-11-13 DIAGNOSIS — I10 Essential (primary) hypertension: Secondary | ICD-10-CM | POA: Diagnosis not present

## 2021-11-13 DIAGNOSIS — I25119 Atherosclerotic heart disease of native coronary artery with unspecified angina pectoris: Secondary | ICD-10-CM | POA: Diagnosis not present

## 2021-11-13 DIAGNOSIS — R1312 Dysphagia, oropharyngeal phase: Secondary | ICD-10-CM | POA: Diagnosis not present

## 2021-11-13 DIAGNOSIS — R41841 Cognitive communication deficit: Secondary | ICD-10-CM | POA: Diagnosis not present

## 2021-11-13 DIAGNOSIS — F0390 Unspecified dementia without behavioral disturbance: Secondary | ICD-10-CM | POA: Diagnosis not present

## 2021-11-13 DIAGNOSIS — I480 Paroxysmal atrial fibrillation: Secondary | ICD-10-CM | POA: Diagnosis not present

## 2021-11-14 DIAGNOSIS — E039 Hypothyroidism, unspecified: Secondary | ICD-10-CM | POA: Diagnosis not present

## 2021-11-14 DIAGNOSIS — R1312 Dysphagia, oropharyngeal phase: Secondary | ICD-10-CM | POA: Diagnosis not present

## 2021-11-14 DIAGNOSIS — I25119 Atherosclerotic heart disease of native coronary artery with unspecified angina pectoris: Secondary | ICD-10-CM | POA: Diagnosis not present

## 2021-11-14 DIAGNOSIS — I480 Paroxysmal atrial fibrillation: Secondary | ICD-10-CM | POA: Diagnosis not present

## 2021-11-14 DIAGNOSIS — I4891 Unspecified atrial fibrillation: Secondary | ICD-10-CM | POA: Diagnosis not present

## 2021-11-14 DIAGNOSIS — F0390 Unspecified dementia without behavioral disturbance: Secondary | ICD-10-CM | POA: Diagnosis not present

## 2021-11-14 DIAGNOSIS — I10 Essential (primary) hypertension: Secondary | ICD-10-CM | POA: Diagnosis not present

## 2021-11-14 DIAGNOSIS — R053 Chronic cough: Secondary | ICD-10-CM | POA: Diagnosis not present

## 2021-11-14 DIAGNOSIS — R41841 Cognitive communication deficit: Secondary | ICD-10-CM | POA: Diagnosis not present

## 2021-11-17 ENCOUNTER — Other Ambulatory Visit: Payer: Self-pay

## 2021-11-17 DIAGNOSIS — L821 Other seborrheic keratosis: Secondary | ICD-10-CM | POA: Diagnosis not present

## 2021-11-17 DIAGNOSIS — L853 Xerosis cutis: Secondary | ICD-10-CM | POA: Diagnosis not present

## 2021-11-17 DIAGNOSIS — I10 Essential (primary) hypertension: Secondary | ICD-10-CM | POA: Diagnosis not present

## 2021-11-17 DIAGNOSIS — R1312 Dysphagia, oropharyngeal phase: Secondary | ICD-10-CM | POA: Diagnosis not present

## 2021-11-17 DIAGNOSIS — L905 Scar conditions and fibrosis of skin: Secondary | ICD-10-CM | POA: Diagnosis not present

## 2021-11-17 DIAGNOSIS — R41841 Cognitive communication deficit: Secondary | ICD-10-CM | POA: Diagnosis not present

## 2021-11-17 DIAGNOSIS — F0390 Unspecified dementia without behavioral disturbance: Secondary | ICD-10-CM | POA: Diagnosis not present

## 2021-11-17 DIAGNOSIS — I480 Paroxysmal atrial fibrillation: Secondary | ICD-10-CM | POA: Diagnosis not present

## 2021-11-17 DIAGNOSIS — I25119 Atherosclerotic heart disease of native coronary artery with unspecified angina pectoris: Secondary | ICD-10-CM | POA: Diagnosis not present

## 2021-11-18 DIAGNOSIS — I25119 Atherosclerotic heart disease of native coronary artery with unspecified angina pectoris: Secondary | ICD-10-CM | POA: Diagnosis not present

## 2021-11-18 DIAGNOSIS — R1312 Dysphagia, oropharyngeal phase: Secondary | ICD-10-CM | POA: Diagnosis not present

## 2021-11-18 DIAGNOSIS — I480 Paroxysmal atrial fibrillation: Secondary | ICD-10-CM | POA: Diagnosis not present

## 2021-11-18 DIAGNOSIS — F0390 Unspecified dementia without behavioral disturbance: Secondary | ICD-10-CM | POA: Diagnosis not present

## 2021-11-18 DIAGNOSIS — I10 Essential (primary) hypertension: Secondary | ICD-10-CM | POA: Diagnosis not present

## 2021-11-18 DIAGNOSIS — R41841 Cognitive communication deficit: Secondary | ICD-10-CM | POA: Diagnosis not present

## 2021-11-19 DIAGNOSIS — I10 Essential (primary) hypertension: Secondary | ICD-10-CM | POA: Diagnosis not present

## 2021-11-19 DIAGNOSIS — I480 Paroxysmal atrial fibrillation: Secondary | ICD-10-CM | POA: Diagnosis not present

## 2021-11-19 DIAGNOSIS — R41841 Cognitive communication deficit: Secondary | ICD-10-CM | POA: Diagnosis not present

## 2021-11-19 DIAGNOSIS — I25119 Atherosclerotic heart disease of native coronary artery with unspecified angina pectoris: Secondary | ICD-10-CM | POA: Diagnosis not present

## 2021-11-19 DIAGNOSIS — R1312 Dysphagia, oropharyngeal phase: Secondary | ICD-10-CM | POA: Diagnosis not present

## 2021-11-19 DIAGNOSIS — F0390 Unspecified dementia without behavioral disturbance: Secondary | ICD-10-CM | POA: Diagnosis not present

## 2021-11-20 DIAGNOSIS — I10 Essential (primary) hypertension: Secondary | ICD-10-CM | POA: Diagnosis not present

## 2021-11-20 DIAGNOSIS — I25119 Atherosclerotic heart disease of native coronary artery with unspecified angina pectoris: Secondary | ICD-10-CM | POA: Diagnosis not present

## 2021-11-20 DIAGNOSIS — I480 Paroxysmal atrial fibrillation: Secondary | ICD-10-CM | POA: Diagnosis not present

## 2021-11-20 DIAGNOSIS — R41841 Cognitive communication deficit: Secondary | ICD-10-CM | POA: Diagnosis not present

## 2021-11-20 DIAGNOSIS — R1312 Dysphagia, oropharyngeal phase: Secondary | ICD-10-CM | POA: Diagnosis not present

## 2021-11-20 DIAGNOSIS — F0390 Unspecified dementia without behavioral disturbance: Secondary | ICD-10-CM | POA: Diagnosis not present

## 2021-11-21 DIAGNOSIS — R41841 Cognitive communication deficit: Secondary | ICD-10-CM | POA: Diagnosis not present

## 2021-11-21 DIAGNOSIS — I10 Essential (primary) hypertension: Secondary | ICD-10-CM | POA: Diagnosis not present

## 2021-11-21 DIAGNOSIS — F0390 Unspecified dementia without behavioral disturbance: Secondary | ICD-10-CM | POA: Diagnosis not present

## 2021-11-21 DIAGNOSIS — I25119 Atherosclerotic heart disease of native coronary artery with unspecified angina pectoris: Secondary | ICD-10-CM | POA: Diagnosis not present

## 2021-11-21 DIAGNOSIS — R1312 Dysphagia, oropharyngeal phase: Secondary | ICD-10-CM | POA: Diagnosis not present

## 2021-11-21 DIAGNOSIS — I480 Paroxysmal atrial fibrillation: Secondary | ICD-10-CM | POA: Diagnosis not present

## 2021-11-24 DIAGNOSIS — R41841 Cognitive communication deficit: Secondary | ICD-10-CM | POA: Diagnosis not present

## 2021-11-24 DIAGNOSIS — I25119 Atherosclerotic heart disease of native coronary artery with unspecified angina pectoris: Secondary | ICD-10-CM | POA: Diagnosis not present

## 2021-11-24 DIAGNOSIS — I480 Paroxysmal atrial fibrillation: Secondary | ICD-10-CM | POA: Diagnosis not present

## 2021-11-24 DIAGNOSIS — F0390 Unspecified dementia without behavioral disturbance: Secondary | ICD-10-CM | POA: Diagnosis not present

## 2021-11-24 DIAGNOSIS — R1312 Dysphagia, oropharyngeal phase: Secondary | ICD-10-CM | POA: Diagnosis not present

## 2021-11-24 DIAGNOSIS — I10 Essential (primary) hypertension: Secondary | ICD-10-CM | POA: Diagnosis not present

## 2021-11-25 DIAGNOSIS — R1312 Dysphagia, oropharyngeal phase: Secondary | ICD-10-CM | POA: Diagnosis not present

## 2021-11-25 DIAGNOSIS — F0390 Unspecified dementia without behavioral disturbance: Secondary | ICD-10-CM | POA: Diagnosis not present

## 2021-11-25 DIAGNOSIS — I480 Paroxysmal atrial fibrillation: Secondary | ICD-10-CM | POA: Diagnosis not present

## 2021-11-25 DIAGNOSIS — I10 Essential (primary) hypertension: Secondary | ICD-10-CM | POA: Diagnosis not present

## 2021-11-25 DIAGNOSIS — R41841 Cognitive communication deficit: Secondary | ICD-10-CM | POA: Diagnosis not present

## 2021-11-25 DIAGNOSIS — I25119 Atherosclerotic heart disease of native coronary artery with unspecified angina pectoris: Secondary | ICD-10-CM | POA: Diagnosis not present

## 2021-11-26 DIAGNOSIS — R41841 Cognitive communication deficit: Secondary | ICD-10-CM | POA: Diagnosis not present

## 2021-11-26 DIAGNOSIS — I25119 Atherosclerotic heart disease of native coronary artery with unspecified angina pectoris: Secondary | ICD-10-CM | POA: Diagnosis not present

## 2021-11-26 DIAGNOSIS — R1312 Dysphagia, oropharyngeal phase: Secondary | ICD-10-CM | POA: Diagnosis not present

## 2021-11-26 DIAGNOSIS — I10 Essential (primary) hypertension: Secondary | ICD-10-CM | POA: Diagnosis not present

## 2021-11-26 DIAGNOSIS — F0390 Unspecified dementia without behavioral disturbance: Secondary | ICD-10-CM | POA: Diagnosis not present

## 2021-11-26 DIAGNOSIS — I480 Paroxysmal atrial fibrillation: Secondary | ICD-10-CM | POA: Diagnosis not present

## 2021-11-27 DIAGNOSIS — I25119 Atherosclerotic heart disease of native coronary artery with unspecified angina pectoris: Secondary | ICD-10-CM | POA: Diagnosis not present

## 2021-11-27 DIAGNOSIS — I480 Paroxysmal atrial fibrillation: Secondary | ICD-10-CM | POA: Diagnosis not present

## 2021-11-27 DIAGNOSIS — R1312 Dysphagia, oropharyngeal phase: Secondary | ICD-10-CM | POA: Diagnosis not present

## 2021-11-27 DIAGNOSIS — R41841 Cognitive communication deficit: Secondary | ICD-10-CM | POA: Diagnosis not present

## 2021-11-27 DIAGNOSIS — F0390 Unspecified dementia without behavioral disturbance: Secondary | ICD-10-CM | POA: Diagnosis not present

## 2021-11-27 DIAGNOSIS — I10 Essential (primary) hypertension: Secondary | ICD-10-CM | POA: Diagnosis not present

## 2021-12-01 DIAGNOSIS — R41841 Cognitive communication deficit: Secondary | ICD-10-CM | POA: Diagnosis not present

## 2021-12-01 DIAGNOSIS — I10 Essential (primary) hypertension: Secondary | ICD-10-CM | POA: Diagnosis not present

## 2021-12-01 DIAGNOSIS — R1312 Dysphagia, oropharyngeal phase: Secondary | ICD-10-CM | POA: Diagnosis not present

## 2021-12-01 DIAGNOSIS — I480 Paroxysmal atrial fibrillation: Secondary | ICD-10-CM | POA: Diagnosis not present

## 2021-12-01 DIAGNOSIS — F0390 Unspecified dementia without behavioral disturbance: Secondary | ICD-10-CM | POA: Diagnosis not present

## 2021-12-01 DIAGNOSIS — I25119 Atherosclerotic heart disease of native coronary artery with unspecified angina pectoris: Secondary | ICD-10-CM | POA: Diagnosis not present

## 2021-12-02 ENCOUNTER — Ambulatory Visit (INDEPENDENT_AMBULATORY_CARE_PROVIDER_SITE_OTHER): Payer: Medicare Other

## 2021-12-02 DIAGNOSIS — I480 Paroxysmal atrial fibrillation: Secondary | ICD-10-CM | POA: Diagnosis not present

## 2021-12-02 DIAGNOSIS — R41841 Cognitive communication deficit: Secondary | ICD-10-CM | POA: Diagnosis not present

## 2021-12-02 DIAGNOSIS — I10 Essential (primary) hypertension: Secondary | ICD-10-CM | POA: Diagnosis not present

## 2021-12-02 DIAGNOSIS — I441 Atrioventricular block, second degree: Secondary | ICD-10-CM

## 2021-12-02 DIAGNOSIS — I25119 Atherosclerotic heart disease of native coronary artery with unspecified angina pectoris: Secondary | ICD-10-CM | POA: Diagnosis not present

## 2021-12-02 DIAGNOSIS — F0390 Unspecified dementia without behavioral disturbance: Secondary | ICD-10-CM | POA: Diagnosis not present

## 2021-12-02 DIAGNOSIS — R1312 Dysphagia, oropharyngeal phase: Secondary | ICD-10-CM | POA: Diagnosis not present

## 2021-12-02 LAB — CUP PACEART REMOTE DEVICE CHECK
Battery Remaining Longevity: 63 mo
Battery Remaining Percentage: 54 %
Battery Voltage: 3.02 V
Brady Statistic AP VP Percent: 1 %
Brady Statistic AP VS Percent: 93 %
Brady Statistic AS VP Percent: 1 %
Brady Statistic AS VS Percent: 7.2 %
Brady Statistic RA Percent Paced: 92 %
Brady Statistic RV Percent Paced: 1 %
Date Time Interrogation Session: 20230228023413
Implantable Lead Implant Date: 20180524
Implantable Lead Implant Date: 20180524
Implantable Lead Location: 753859
Implantable Lead Location: 753860
Implantable Lead Model: 3830
Implantable Pulse Generator Implant Date: 20180524
Lead Channel Impedance Value: 560 Ohm
Lead Channel Impedance Value: 580 Ohm
Lead Channel Pacing Threshold Amplitude: 0.75 V
Lead Channel Pacing Threshold Amplitude: 0.75 V
Lead Channel Pacing Threshold Pulse Width: 0.5 ms
Lead Channel Pacing Threshold Pulse Width: 0.5 ms
Lead Channel Sensing Intrinsic Amplitude: 12 mV
Lead Channel Sensing Intrinsic Amplitude: 2.1 mV
Lead Channel Setting Pacing Amplitude: 2 V
Lead Channel Setting Pacing Amplitude: 2.5 V
Lead Channel Setting Pacing Pulse Width: 0.5 ms
Lead Channel Setting Sensing Sensitivity: 2 mV
Pulse Gen Model: 2272
Pulse Gen Serial Number: 8911338

## 2021-12-03 DIAGNOSIS — R262 Difficulty in walking, not elsewhere classified: Secondary | ICD-10-CM | POA: Diagnosis not present

## 2021-12-03 DIAGNOSIS — E7800 Pure hypercholesterolemia, unspecified: Secondary | ICD-10-CM | POA: Diagnosis not present

## 2021-12-03 DIAGNOSIS — R2689 Other abnormalities of gait and mobility: Secondary | ICD-10-CM | POA: Diagnosis not present

## 2021-12-03 DIAGNOSIS — E039 Hypothyroidism, unspecified: Secondary | ICD-10-CM | POA: Diagnosis not present

## 2021-12-03 DIAGNOSIS — I480 Paroxysmal atrial fibrillation: Secondary | ICD-10-CM | POA: Diagnosis not present

## 2021-12-03 DIAGNOSIS — F0390 Unspecified dementia without behavioral disturbance: Secondary | ICD-10-CM | POA: Diagnosis not present

## 2021-12-03 DIAGNOSIS — Z950 Presence of cardiac pacemaker: Secondary | ICD-10-CM | POA: Diagnosis not present

## 2021-12-03 DIAGNOSIS — I10 Essential (primary) hypertension: Secondary | ICD-10-CM | POA: Diagnosis not present

## 2021-12-03 DIAGNOSIS — Z9181 History of falling: Secondary | ICD-10-CM | POA: Diagnosis not present

## 2021-12-03 DIAGNOSIS — R41841 Cognitive communication deficit: Secondary | ICD-10-CM | POA: Diagnosis not present

## 2021-12-03 DIAGNOSIS — Z8673 Personal history of transient ischemic attack (TIA), and cerebral infarction without residual deficits: Secondary | ICD-10-CM | POA: Diagnosis not present

## 2021-12-03 DIAGNOSIS — R1312 Dysphagia, oropharyngeal phase: Secondary | ICD-10-CM | POA: Diagnosis not present

## 2021-12-03 DIAGNOSIS — M6281 Muscle weakness (generalized): Secondary | ICD-10-CM | POA: Diagnosis not present

## 2021-12-03 DIAGNOSIS — I25119 Atherosclerotic heart disease of native coronary artery with unspecified angina pectoris: Secondary | ICD-10-CM | POA: Diagnosis not present

## 2021-12-03 DIAGNOSIS — R279 Unspecified lack of coordination: Secondary | ICD-10-CM | POA: Diagnosis not present

## 2021-12-04 DIAGNOSIS — R41841 Cognitive communication deficit: Secondary | ICD-10-CM | POA: Diagnosis not present

## 2021-12-04 DIAGNOSIS — I25119 Atherosclerotic heart disease of native coronary artery with unspecified angina pectoris: Secondary | ICD-10-CM | POA: Diagnosis not present

## 2021-12-04 DIAGNOSIS — I480 Paroxysmal atrial fibrillation: Secondary | ICD-10-CM | POA: Diagnosis not present

## 2021-12-04 DIAGNOSIS — F0390 Unspecified dementia without behavioral disturbance: Secondary | ICD-10-CM | POA: Diagnosis not present

## 2021-12-04 DIAGNOSIS — R1312 Dysphagia, oropharyngeal phase: Secondary | ICD-10-CM | POA: Diagnosis not present

## 2021-12-04 DIAGNOSIS — I10 Essential (primary) hypertension: Secondary | ICD-10-CM | POA: Diagnosis not present

## 2021-12-05 DIAGNOSIS — F0390 Unspecified dementia without behavioral disturbance: Secondary | ICD-10-CM | POA: Diagnosis not present

## 2021-12-05 DIAGNOSIS — I480 Paroxysmal atrial fibrillation: Secondary | ICD-10-CM | POA: Diagnosis not present

## 2021-12-05 DIAGNOSIS — I25119 Atherosclerotic heart disease of native coronary artery with unspecified angina pectoris: Secondary | ICD-10-CM | POA: Diagnosis not present

## 2021-12-05 DIAGNOSIS — R1312 Dysphagia, oropharyngeal phase: Secondary | ICD-10-CM | POA: Diagnosis not present

## 2021-12-05 DIAGNOSIS — I10 Essential (primary) hypertension: Secondary | ICD-10-CM | POA: Diagnosis not present

## 2021-12-05 DIAGNOSIS — R41841 Cognitive communication deficit: Secondary | ICD-10-CM | POA: Diagnosis not present

## 2021-12-08 DIAGNOSIS — I25119 Atherosclerotic heart disease of native coronary artery with unspecified angina pectoris: Secondary | ICD-10-CM | POA: Diagnosis not present

## 2021-12-08 DIAGNOSIS — I480 Paroxysmal atrial fibrillation: Secondary | ICD-10-CM | POA: Diagnosis not present

## 2021-12-08 DIAGNOSIS — R1312 Dysphagia, oropharyngeal phase: Secondary | ICD-10-CM | POA: Diagnosis not present

## 2021-12-08 DIAGNOSIS — R41841 Cognitive communication deficit: Secondary | ICD-10-CM | POA: Diagnosis not present

## 2021-12-08 DIAGNOSIS — F0390 Unspecified dementia without behavioral disturbance: Secondary | ICD-10-CM | POA: Diagnosis not present

## 2021-12-08 DIAGNOSIS — I10 Essential (primary) hypertension: Secondary | ICD-10-CM | POA: Diagnosis not present

## 2021-12-09 DIAGNOSIS — I10 Essential (primary) hypertension: Secondary | ICD-10-CM | POA: Diagnosis not present

## 2021-12-09 DIAGNOSIS — I25119 Atherosclerotic heart disease of native coronary artery with unspecified angina pectoris: Secondary | ICD-10-CM | POA: Diagnosis not present

## 2021-12-09 DIAGNOSIS — R1312 Dysphagia, oropharyngeal phase: Secondary | ICD-10-CM | POA: Diagnosis not present

## 2021-12-09 DIAGNOSIS — R41841 Cognitive communication deficit: Secondary | ICD-10-CM | POA: Diagnosis not present

## 2021-12-09 DIAGNOSIS — F0390 Unspecified dementia without behavioral disturbance: Secondary | ICD-10-CM | POA: Diagnosis not present

## 2021-12-09 DIAGNOSIS — I480 Paroxysmal atrial fibrillation: Secondary | ICD-10-CM | POA: Diagnosis not present

## 2021-12-09 NOTE — Progress Notes (Signed)
Remote pacemaker transmission.   

## 2021-12-10 DIAGNOSIS — F0390 Unspecified dementia without behavioral disturbance: Secondary | ICD-10-CM | POA: Diagnosis not present

## 2021-12-10 DIAGNOSIS — R1312 Dysphagia, oropharyngeal phase: Secondary | ICD-10-CM | POA: Diagnosis not present

## 2021-12-10 DIAGNOSIS — I10 Essential (primary) hypertension: Secondary | ICD-10-CM | POA: Diagnosis not present

## 2021-12-10 DIAGNOSIS — I480 Paroxysmal atrial fibrillation: Secondary | ICD-10-CM | POA: Diagnosis not present

## 2021-12-10 DIAGNOSIS — I25119 Atherosclerotic heart disease of native coronary artery with unspecified angina pectoris: Secondary | ICD-10-CM | POA: Diagnosis not present

## 2021-12-10 DIAGNOSIS — R41841 Cognitive communication deficit: Secondary | ICD-10-CM | POA: Diagnosis not present

## 2021-12-12 DIAGNOSIS — R41841 Cognitive communication deficit: Secondary | ICD-10-CM | POA: Diagnosis not present

## 2021-12-12 DIAGNOSIS — F0390 Unspecified dementia without behavioral disturbance: Secondary | ICD-10-CM | POA: Diagnosis not present

## 2021-12-12 DIAGNOSIS — R1312 Dysphagia, oropharyngeal phase: Secondary | ICD-10-CM | POA: Diagnosis not present

## 2021-12-12 DIAGNOSIS — I10 Essential (primary) hypertension: Secondary | ICD-10-CM | POA: Diagnosis not present

## 2021-12-12 DIAGNOSIS — I25119 Atherosclerotic heart disease of native coronary artery with unspecified angina pectoris: Secondary | ICD-10-CM | POA: Diagnosis not present

## 2021-12-12 DIAGNOSIS — I480 Paroxysmal atrial fibrillation: Secondary | ICD-10-CM | POA: Diagnosis not present

## 2021-12-16 DIAGNOSIS — F0390 Unspecified dementia without behavioral disturbance: Secondary | ICD-10-CM | POA: Diagnosis not present

## 2021-12-16 DIAGNOSIS — R1312 Dysphagia, oropharyngeal phase: Secondary | ICD-10-CM | POA: Diagnosis not present

## 2021-12-16 DIAGNOSIS — I480 Paroxysmal atrial fibrillation: Secondary | ICD-10-CM | POA: Diagnosis not present

## 2021-12-16 DIAGNOSIS — I10 Essential (primary) hypertension: Secondary | ICD-10-CM | POA: Diagnosis not present

## 2021-12-16 DIAGNOSIS — R41841 Cognitive communication deficit: Secondary | ICD-10-CM | POA: Diagnosis not present

## 2021-12-16 DIAGNOSIS — I25119 Atherosclerotic heart disease of native coronary artery with unspecified angina pectoris: Secondary | ICD-10-CM | POA: Diagnosis not present

## 2021-12-17 DIAGNOSIS — I25119 Atherosclerotic heart disease of native coronary artery with unspecified angina pectoris: Secondary | ICD-10-CM | POA: Diagnosis not present

## 2021-12-17 DIAGNOSIS — R1312 Dysphagia, oropharyngeal phase: Secondary | ICD-10-CM | POA: Diagnosis not present

## 2021-12-17 DIAGNOSIS — F0390 Unspecified dementia without behavioral disturbance: Secondary | ICD-10-CM | POA: Diagnosis not present

## 2021-12-17 DIAGNOSIS — R41841 Cognitive communication deficit: Secondary | ICD-10-CM | POA: Diagnosis not present

## 2021-12-17 DIAGNOSIS — I480 Paroxysmal atrial fibrillation: Secondary | ICD-10-CM | POA: Diagnosis not present

## 2021-12-17 DIAGNOSIS — I10 Essential (primary) hypertension: Secondary | ICD-10-CM | POA: Diagnosis not present

## 2021-12-18 DIAGNOSIS — I480 Paroxysmal atrial fibrillation: Secondary | ICD-10-CM | POA: Diagnosis not present

## 2021-12-18 DIAGNOSIS — R41841 Cognitive communication deficit: Secondary | ICD-10-CM | POA: Diagnosis not present

## 2021-12-18 DIAGNOSIS — R1312 Dysphagia, oropharyngeal phase: Secondary | ICD-10-CM | POA: Diagnosis not present

## 2021-12-18 DIAGNOSIS — F0390 Unspecified dementia without behavioral disturbance: Secondary | ICD-10-CM | POA: Diagnosis not present

## 2021-12-18 DIAGNOSIS — I10 Essential (primary) hypertension: Secondary | ICD-10-CM | POA: Diagnosis not present

## 2021-12-18 DIAGNOSIS — I25119 Atherosclerotic heart disease of native coronary artery with unspecified angina pectoris: Secondary | ICD-10-CM | POA: Diagnosis not present

## 2021-12-19 DIAGNOSIS — I10 Essential (primary) hypertension: Secondary | ICD-10-CM | POA: Diagnosis not present

## 2021-12-19 DIAGNOSIS — F0390 Unspecified dementia without behavioral disturbance: Secondary | ICD-10-CM | POA: Diagnosis not present

## 2021-12-19 DIAGNOSIS — I25119 Atherosclerotic heart disease of native coronary artery with unspecified angina pectoris: Secondary | ICD-10-CM | POA: Diagnosis not present

## 2021-12-19 DIAGNOSIS — I480 Paroxysmal atrial fibrillation: Secondary | ICD-10-CM | POA: Diagnosis not present

## 2021-12-19 DIAGNOSIS — R41841 Cognitive communication deficit: Secondary | ICD-10-CM | POA: Diagnosis not present

## 2021-12-19 DIAGNOSIS — R1312 Dysphagia, oropharyngeal phase: Secondary | ICD-10-CM | POA: Diagnosis not present

## 2021-12-22 DIAGNOSIS — I480 Paroxysmal atrial fibrillation: Secondary | ICD-10-CM | POA: Diagnosis not present

## 2021-12-22 DIAGNOSIS — I10 Essential (primary) hypertension: Secondary | ICD-10-CM | POA: Diagnosis not present

## 2021-12-22 DIAGNOSIS — F0390 Unspecified dementia without behavioral disturbance: Secondary | ICD-10-CM | POA: Diagnosis not present

## 2021-12-22 DIAGNOSIS — I25119 Atherosclerotic heart disease of native coronary artery with unspecified angina pectoris: Secondary | ICD-10-CM | POA: Diagnosis not present

## 2021-12-22 DIAGNOSIS — R1312 Dysphagia, oropharyngeal phase: Secondary | ICD-10-CM | POA: Diagnosis not present

## 2021-12-22 DIAGNOSIS — R41841 Cognitive communication deficit: Secondary | ICD-10-CM | POA: Diagnosis not present

## 2021-12-23 DIAGNOSIS — R1312 Dysphagia, oropharyngeal phase: Secondary | ICD-10-CM | POA: Diagnosis not present

## 2021-12-23 DIAGNOSIS — F0390 Unspecified dementia without behavioral disturbance: Secondary | ICD-10-CM | POA: Diagnosis not present

## 2021-12-23 DIAGNOSIS — I480 Paroxysmal atrial fibrillation: Secondary | ICD-10-CM | POA: Diagnosis not present

## 2021-12-23 DIAGNOSIS — R41841 Cognitive communication deficit: Secondary | ICD-10-CM | POA: Diagnosis not present

## 2021-12-23 DIAGNOSIS — I25119 Atherosclerotic heart disease of native coronary artery with unspecified angina pectoris: Secondary | ICD-10-CM | POA: Diagnosis not present

## 2021-12-23 DIAGNOSIS — I10 Essential (primary) hypertension: Secondary | ICD-10-CM | POA: Diagnosis not present

## 2021-12-24 DIAGNOSIS — I25119 Atherosclerotic heart disease of native coronary artery with unspecified angina pectoris: Secondary | ICD-10-CM | POA: Diagnosis not present

## 2021-12-24 DIAGNOSIS — F0390 Unspecified dementia without behavioral disturbance: Secondary | ICD-10-CM | POA: Diagnosis not present

## 2021-12-24 DIAGNOSIS — R1312 Dysphagia, oropharyngeal phase: Secondary | ICD-10-CM | POA: Diagnosis not present

## 2021-12-24 DIAGNOSIS — R41841 Cognitive communication deficit: Secondary | ICD-10-CM | POA: Diagnosis not present

## 2021-12-24 DIAGNOSIS — I480 Paroxysmal atrial fibrillation: Secondary | ICD-10-CM | POA: Diagnosis not present

## 2021-12-24 DIAGNOSIS — I10 Essential (primary) hypertension: Secondary | ICD-10-CM | POA: Diagnosis not present

## 2021-12-25 DIAGNOSIS — I10 Essential (primary) hypertension: Secondary | ICD-10-CM | POA: Diagnosis not present

## 2021-12-25 DIAGNOSIS — R1312 Dysphagia, oropharyngeal phase: Secondary | ICD-10-CM | POA: Diagnosis not present

## 2021-12-25 DIAGNOSIS — I480 Paroxysmal atrial fibrillation: Secondary | ICD-10-CM | POA: Diagnosis not present

## 2021-12-25 DIAGNOSIS — R41841 Cognitive communication deficit: Secondary | ICD-10-CM | POA: Diagnosis not present

## 2021-12-25 DIAGNOSIS — I25119 Atherosclerotic heart disease of native coronary artery with unspecified angina pectoris: Secondary | ICD-10-CM | POA: Diagnosis not present

## 2021-12-25 DIAGNOSIS — F0390 Unspecified dementia without behavioral disturbance: Secondary | ICD-10-CM | POA: Diagnosis not present

## 2021-12-29 DIAGNOSIS — I25119 Atherosclerotic heart disease of native coronary artery with unspecified angina pectoris: Secondary | ICD-10-CM | POA: Diagnosis not present

## 2021-12-29 DIAGNOSIS — I10 Essential (primary) hypertension: Secondary | ICD-10-CM | POA: Diagnosis not present

## 2021-12-29 DIAGNOSIS — I480 Paroxysmal atrial fibrillation: Secondary | ICD-10-CM | POA: Diagnosis not present

## 2021-12-29 DIAGNOSIS — R1312 Dysphagia, oropharyngeal phase: Secondary | ICD-10-CM | POA: Diagnosis not present

## 2021-12-29 DIAGNOSIS — F0390 Unspecified dementia without behavioral disturbance: Secondary | ICD-10-CM | POA: Diagnosis not present

## 2021-12-29 DIAGNOSIS — R41841 Cognitive communication deficit: Secondary | ICD-10-CM | POA: Diagnosis not present

## 2021-12-31 DIAGNOSIS — I480 Paroxysmal atrial fibrillation: Secondary | ICD-10-CM | POA: Diagnosis not present

## 2021-12-31 DIAGNOSIS — R1312 Dysphagia, oropharyngeal phase: Secondary | ICD-10-CM | POA: Diagnosis not present

## 2021-12-31 DIAGNOSIS — R41841 Cognitive communication deficit: Secondary | ICD-10-CM | POA: Diagnosis not present

## 2021-12-31 DIAGNOSIS — I10 Essential (primary) hypertension: Secondary | ICD-10-CM | POA: Diagnosis not present

## 2021-12-31 DIAGNOSIS — I25119 Atherosclerotic heart disease of native coronary artery with unspecified angina pectoris: Secondary | ICD-10-CM | POA: Diagnosis not present

## 2021-12-31 DIAGNOSIS — F0390 Unspecified dementia without behavioral disturbance: Secondary | ICD-10-CM | POA: Diagnosis not present

## 2022-01-01 DIAGNOSIS — R41841 Cognitive communication deficit: Secondary | ICD-10-CM | POA: Diagnosis not present

## 2022-01-01 DIAGNOSIS — R1312 Dysphagia, oropharyngeal phase: Secondary | ICD-10-CM | POA: Diagnosis not present

## 2022-01-01 DIAGNOSIS — I480 Paroxysmal atrial fibrillation: Secondary | ICD-10-CM | POA: Diagnosis not present

## 2022-01-01 DIAGNOSIS — I10 Essential (primary) hypertension: Secondary | ICD-10-CM | POA: Diagnosis not present

## 2022-01-01 DIAGNOSIS — I25119 Atherosclerotic heart disease of native coronary artery with unspecified angina pectoris: Secondary | ICD-10-CM | POA: Diagnosis not present

## 2022-01-01 DIAGNOSIS — F0390 Unspecified dementia without behavioral disturbance: Secondary | ICD-10-CM | POA: Diagnosis not present

## 2022-01-02 DIAGNOSIS — R1312 Dysphagia, oropharyngeal phase: Secondary | ICD-10-CM | POA: Diagnosis not present

## 2022-01-02 DIAGNOSIS — I10 Essential (primary) hypertension: Secondary | ICD-10-CM | POA: Diagnosis not present

## 2022-01-02 DIAGNOSIS — F0390 Unspecified dementia without behavioral disturbance: Secondary | ICD-10-CM | POA: Diagnosis not present

## 2022-01-02 DIAGNOSIS — R41841 Cognitive communication deficit: Secondary | ICD-10-CM | POA: Diagnosis not present

## 2022-01-02 DIAGNOSIS — I25119 Atherosclerotic heart disease of native coronary artery with unspecified angina pectoris: Secondary | ICD-10-CM | POA: Diagnosis not present

## 2022-01-02 DIAGNOSIS — I480 Paroxysmal atrial fibrillation: Secondary | ICD-10-CM | POA: Diagnosis not present

## 2022-01-05 DIAGNOSIS — R1312 Dysphagia, oropharyngeal phase: Secondary | ICD-10-CM | POA: Diagnosis not present

## 2022-01-05 DIAGNOSIS — Z8673 Personal history of transient ischemic attack (TIA), and cerebral infarction without residual deficits: Secondary | ICD-10-CM | POA: Diagnosis not present

## 2022-01-05 DIAGNOSIS — R262 Difficulty in walking, not elsewhere classified: Secondary | ICD-10-CM | POA: Diagnosis not present

## 2022-01-05 DIAGNOSIS — R2689 Other abnormalities of gait and mobility: Secondary | ICD-10-CM | POA: Diagnosis not present

## 2022-01-05 DIAGNOSIS — R279 Unspecified lack of coordination: Secondary | ICD-10-CM | POA: Diagnosis not present

## 2022-01-05 DIAGNOSIS — I10 Essential (primary) hypertension: Secondary | ICD-10-CM | POA: Diagnosis not present

## 2022-01-05 DIAGNOSIS — Z9181 History of falling: Secondary | ICD-10-CM | POA: Diagnosis not present

## 2022-01-05 DIAGNOSIS — R41841 Cognitive communication deficit: Secondary | ICD-10-CM | POA: Diagnosis not present

## 2022-01-05 DIAGNOSIS — Z950 Presence of cardiac pacemaker: Secondary | ICD-10-CM | POA: Diagnosis not present

## 2022-01-05 DIAGNOSIS — I480 Paroxysmal atrial fibrillation: Secondary | ICD-10-CM | POA: Diagnosis not present

## 2022-01-05 DIAGNOSIS — M6281 Muscle weakness (generalized): Secondary | ICD-10-CM | POA: Diagnosis not present

## 2022-01-05 DIAGNOSIS — E7800 Pure hypercholesterolemia, unspecified: Secondary | ICD-10-CM | POA: Diagnosis not present

## 2022-01-05 DIAGNOSIS — F0390 Unspecified dementia without behavioral disturbance: Secondary | ICD-10-CM | POA: Diagnosis not present

## 2022-01-05 DIAGNOSIS — E039 Hypothyroidism, unspecified: Secondary | ICD-10-CM | POA: Diagnosis not present

## 2022-01-05 DIAGNOSIS — I25119 Atherosclerotic heart disease of native coronary artery with unspecified angina pectoris: Secondary | ICD-10-CM | POA: Diagnosis not present

## 2022-01-06 DIAGNOSIS — I10 Essential (primary) hypertension: Secondary | ICD-10-CM | POA: Diagnosis not present

## 2022-01-06 DIAGNOSIS — F0390 Unspecified dementia without behavioral disturbance: Secondary | ICD-10-CM | POA: Diagnosis not present

## 2022-01-06 DIAGNOSIS — I480 Paroxysmal atrial fibrillation: Secondary | ICD-10-CM | POA: Diagnosis not present

## 2022-01-06 DIAGNOSIS — I25119 Atherosclerotic heart disease of native coronary artery with unspecified angina pectoris: Secondary | ICD-10-CM | POA: Diagnosis not present

## 2022-01-06 DIAGNOSIS — R41841 Cognitive communication deficit: Secondary | ICD-10-CM | POA: Diagnosis not present

## 2022-01-06 DIAGNOSIS — R1312 Dysphagia, oropharyngeal phase: Secondary | ICD-10-CM | POA: Diagnosis not present

## 2022-01-07 DIAGNOSIS — I10 Essential (primary) hypertension: Secondary | ICD-10-CM | POA: Diagnosis not present

## 2022-01-07 DIAGNOSIS — R41841 Cognitive communication deficit: Secondary | ICD-10-CM | POA: Diagnosis not present

## 2022-01-07 DIAGNOSIS — I480 Paroxysmal atrial fibrillation: Secondary | ICD-10-CM | POA: Diagnosis not present

## 2022-01-07 DIAGNOSIS — F0390 Unspecified dementia without behavioral disturbance: Secondary | ICD-10-CM | POA: Diagnosis not present

## 2022-01-07 DIAGNOSIS — I25119 Atherosclerotic heart disease of native coronary artery with unspecified angina pectoris: Secondary | ICD-10-CM | POA: Diagnosis not present

## 2022-01-07 DIAGNOSIS — R1312 Dysphagia, oropharyngeal phase: Secondary | ICD-10-CM | POA: Diagnosis not present

## 2022-01-08 DIAGNOSIS — F0390 Unspecified dementia without behavioral disturbance: Secondary | ICD-10-CM | POA: Diagnosis not present

## 2022-01-08 DIAGNOSIS — I25119 Atherosclerotic heart disease of native coronary artery with unspecified angina pectoris: Secondary | ICD-10-CM | POA: Diagnosis not present

## 2022-01-08 DIAGNOSIS — R1312 Dysphagia, oropharyngeal phase: Secondary | ICD-10-CM | POA: Diagnosis not present

## 2022-01-08 DIAGNOSIS — I480 Paroxysmal atrial fibrillation: Secondary | ICD-10-CM | POA: Diagnosis not present

## 2022-01-08 DIAGNOSIS — R41841 Cognitive communication deficit: Secondary | ICD-10-CM | POA: Diagnosis not present

## 2022-01-08 DIAGNOSIS — I10 Essential (primary) hypertension: Secondary | ICD-10-CM | POA: Diagnosis not present

## 2022-01-14 DIAGNOSIS — R1312 Dysphagia, oropharyngeal phase: Secondary | ICD-10-CM | POA: Diagnosis not present

## 2022-01-14 DIAGNOSIS — I480 Paroxysmal atrial fibrillation: Secondary | ICD-10-CM | POA: Diagnosis not present

## 2022-01-14 DIAGNOSIS — I25119 Atherosclerotic heart disease of native coronary artery with unspecified angina pectoris: Secondary | ICD-10-CM | POA: Diagnosis not present

## 2022-01-14 DIAGNOSIS — R41841 Cognitive communication deficit: Secondary | ICD-10-CM | POA: Diagnosis not present

## 2022-01-14 DIAGNOSIS — F0390 Unspecified dementia without behavioral disturbance: Secondary | ICD-10-CM | POA: Diagnosis not present

## 2022-01-14 DIAGNOSIS — I10 Essential (primary) hypertension: Secondary | ICD-10-CM | POA: Diagnosis not present

## 2022-01-16 DIAGNOSIS — I480 Paroxysmal atrial fibrillation: Secondary | ICD-10-CM | POA: Diagnosis not present

## 2022-01-16 DIAGNOSIS — R41841 Cognitive communication deficit: Secondary | ICD-10-CM | POA: Diagnosis not present

## 2022-01-16 DIAGNOSIS — I10 Essential (primary) hypertension: Secondary | ICD-10-CM | POA: Diagnosis not present

## 2022-01-16 DIAGNOSIS — I25119 Atherosclerotic heart disease of native coronary artery with unspecified angina pectoris: Secondary | ICD-10-CM | POA: Diagnosis not present

## 2022-01-16 DIAGNOSIS — R1312 Dysphagia, oropharyngeal phase: Secondary | ICD-10-CM | POA: Diagnosis not present

## 2022-01-16 DIAGNOSIS — F0390 Unspecified dementia without behavioral disturbance: Secondary | ICD-10-CM | POA: Diagnosis not present

## 2022-01-19 DIAGNOSIS — I25119 Atherosclerotic heart disease of native coronary artery with unspecified angina pectoris: Secondary | ICD-10-CM | POA: Diagnosis not present

## 2022-01-19 DIAGNOSIS — F0390 Unspecified dementia without behavioral disturbance: Secondary | ICD-10-CM | POA: Diagnosis not present

## 2022-01-19 DIAGNOSIS — I480 Paroxysmal atrial fibrillation: Secondary | ICD-10-CM | POA: Diagnosis not present

## 2022-01-19 DIAGNOSIS — R41841 Cognitive communication deficit: Secondary | ICD-10-CM | POA: Diagnosis not present

## 2022-01-19 DIAGNOSIS — R1312 Dysphagia, oropharyngeal phase: Secondary | ICD-10-CM | POA: Diagnosis not present

## 2022-01-19 DIAGNOSIS — I10 Essential (primary) hypertension: Secondary | ICD-10-CM | POA: Diagnosis not present

## 2022-01-21 DIAGNOSIS — I480 Paroxysmal atrial fibrillation: Secondary | ICD-10-CM | POA: Diagnosis not present

## 2022-01-21 DIAGNOSIS — R1312 Dysphagia, oropharyngeal phase: Secondary | ICD-10-CM | POA: Diagnosis not present

## 2022-01-21 DIAGNOSIS — I10 Essential (primary) hypertension: Secondary | ICD-10-CM | POA: Diagnosis not present

## 2022-01-21 DIAGNOSIS — I25119 Atherosclerotic heart disease of native coronary artery with unspecified angina pectoris: Secondary | ICD-10-CM | POA: Diagnosis not present

## 2022-01-21 DIAGNOSIS — R41841 Cognitive communication deficit: Secondary | ICD-10-CM | POA: Diagnosis not present

## 2022-01-21 DIAGNOSIS — F0390 Unspecified dementia without behavioral disturbance: Secondary | ICD-10-CM | POA: Diagnosis not present

## 2022-01-22 DIAGNOSIS — R1312 Dysphagia, oropharyngeal phase: Secondary | ICD-10-CM | POA: Diagnosis not present

## 2022-01-22 DIAGNOSIS — F0390 Unspecified dementia without behavioral disturbance: Secondary | ICD-10-CM | POA: Diagnosis not present

## 2022-01-22 DIAGNOSIS — I25119 Atherosclerotic heart disease of native coronary artery with unspecified angina pectoris: Secondary | ICD-10-CM | POA: Diagnosis not present

## 2022-01-22 DIAGNOSIS — R41841 Cognitive communication deficit: Secondary | ICD-10-CM | POA: Diagnosis not present

## 2022-01-22 DIAGNOSIS — I10 Essential (primary) hypertension: Secondary | ICD-10-CM | POA: Diagnosis not present

## 2022-01-22 DIAGNOSIS — I480 Paroxysmal atrial fibrillation: Secondary | ICD-10-CM | POA: Diagnosis not present

## 2022-01-23 DIAGNOSIS — F0390 Unspecified dementia without behavioral disturbance: Secondary | ICD-10-CM | POA: Diagnosis not present

## 2022-01-23 DIAGNOSIS — I480 Paroxysmal atrial fibrillation: Secondary | ICD-10-CM | POA: Diagnosis not present

## 2022-01-23 DIAGNOSIS — I10 Essential (primary) hypertension: Secondary | ICD-10-CM | POA: Diagnosis not present

## 2022-01-23 DIAGNOSIS — I25119 Atherosclerotic heart disease of native coronary artery with unspecified angina pectoris: Secondary | ICD-10-CM | POA: Diagnosis not present

## 2022-01-23 DIAGNOSIS — R1312 Dysphagia, oropharyngeal phase: Secondary | ICD-10-CM | POA: Diagnosis not present

## 2022-01-23 DIAGNOSIS — R41841 Cognitive communication deficit: Secondary | ICD-10-CM | POA: Diagnosis not present

## 2022-01-26 DIAGNOSIS — F0390 Unspecified dementia without behavioral disturbance: Secondary | ICD-10-CM | POA: Diagnosis not present

## 2022-01-26 DIAGNOSIS — I10 Essential (primary) hypertension: Secondary | ICD-10-CM | POA: Diagnosis not present

## 2022-01-26 DIAGNOSIS — I25119 Atherosclerotic heart disease of native coronary artery with unspecified angina pectoris: Secondary | ICD-10-CM | POA: Diagnosis not present

## 2022-01-26 DIAGNOSIS — R1312 Dysphagia, oropharyngeal phase: Secondary | ICD-10-CM | POA: Diagnosis not present

## 2022-01-26 DIAGNOSIS — R41841 Cognitive communication deficit: Secondary | ICD-10-CM | POA: Diagnosis not present

## 2022-01-26 DIAGNOSIS — I480 Paroxysmal atrial fibrillation: Secondary | ICD-10-CM | POA: Diagnosis not present

## 2022-01-27 DIAGNOSIS — R41841 Cognitive communication deficit: Secondary | ICD-10-CM | POA: Diagnosis not present

## 2022-01-27 DIAGNOSIS — I10 Essential (primary) hypertension: Secondary | ICD-10-CM | POA: Diagnosis not present

## 2022-01-27 DIAGNOSIS — I480 Paroxysmal atrial fibrillation: Secondary | ICD-10-CM | POA: Diagnosis not present

## 2022-01-27 DIAGNOSIS — F0390 Unspecified dementia without behavioral disturbance: Secondary | ICD-10-CM | POA: Diagnosis not present

## 2022-01-27 DIAGNOSIS — I25119 Atherosclerotic heart disease of native coronary artery with unspecified angina pectoris: Secondary | ICD-10-CM | POA: Diagnosis not present

## 2022-01-27 DIAGNOSIS — R1312 Dysphagia, oropharyngeal phase: Secondary | ICD-10-CM | POA: Diagnosis not present

## 2022-01-29 DIAGNOSIS — R41841 Cognitive communication deficit: Secondary | ICD-10-CM | POA: Diagnosis not present

## 2022-01-29 DIAGNOSIS — I480 Paroxysmal atrial fibrillation: Secondary | ICD-10-CM | POA: Diagnosis not present

## 2022-01-29 DIAGNOSIS — I25119 Atherosclerotic heart disease of native coronary artery with unspecified angina pectoris: Secondary | ICD-10-CM | POA: Diagnosis not present

## 2022-01-29 DIAGNOSIS — I10 Essential (primary) hypertension: Secondary | ICD-10-CM | POA: Diagnosis not present

## 2022-01-29 DIAGNOSIS — R1312 Dysphagia, oropharyngeal phase: Secondary | ICD-10-CM | POA: Diagnosis not present

## 2022-01-29 DIAGNOSIS — F0390 Unspecified dementia without behavioral disturbance: Secondary | ICD-10-CM | POA: Diagnosis not present

## 2022-01-30 DIAGNOSIS — I10 Essential (primary) hypertension: Secondary | ICD-10-CM | POA: Diagnosis not present

## 2022-01-30 DIAGNOSIS — I25119 Atherosclerotic heart disease of native coronary artery with unspecified angina pectoris: Secondary | ICD-10-CM | POA: Diagnosis not present

## 2022-01-30 DIAGNOSIS — I480 Paroxysmal atrial fibrillation: Secondary | ICD-10-CM | POA: Diagnosis not present

## 2022-01-30 DIAGNOSIS — F0390 Unspecified dementia without behavioral disturbance: Secondary | ICD-10-CM | POA: Diagnosis not present

## 2022-01-30 DIAGNOSIS — R41841 Cognitive communication deficit: Secondary | ICD-10-CM | POA: Diagnosis not present

## 2022-01-30 DIAGNOSIS — R1312 Dysphagia, oropharyngeal phase: Secondary | ICD-10-CM | POA: Diagnosis not present

## 2022-02-02 DIAGNOSIS — Z9181 History of falling: Secondary | ICD-10-CM | POA: Diagnosis not present

## 2022-02-02 DIAGNOSIS — Z8673 Personal history of transient ischemic attack (TIA), and cerebral infarction without residual deficits: Secondary | ICD-10-CM | POA: Diagnosis not present

## 2022-02-02 DIAGNOSIS — R1312 Dysphagia, oropharyngeal phase: Secondary | ICD-10-CM | POA: Diagnosis not present

## 2022-02-02 DIAGNOSIS — I480 Paroxysmal atrial fibrillation: Secondary | ICD-10-CM | POA: Diagnosis not present

## 2022-02-02 DIAGNOSIS — E7800 Pure hypercholesterolemia, unspecified: Secondary | ICD-10-CM | POA: Diagnosis not present

## 2022-02-02 DIAGNOSIS — E039 Hypothyroidism, unspecified: Secondary | ICD-10-CM | POA: Diagnosis not present

## 2022-02-02 DIAGNOSIS — M6281 Muscle weakness (generalized): Secondary | ICD-10-CM | POA: Diagnosis not present

## 2022-02-02 DIAGNOSIS — R262 Difficulty in walking, not elsewhere classified: Secondary | ICD-10-CM | POA: Diagnosis not present

## 2022-02-02 DIAGNOSIS — I25119 Atherosclerotic heart disease of native coronary artery with unspecified angina pectoris: Secondary | ICD-10-CM | POA: Diagnosis not present

## 2022-02-02 DIAGNOSIS — Z950 Presence of cardiac pacemaker: Secondary | ICD-10-CM | POA: Diagnosis not present

## 2022-02-02 DIAGNOSIS — I10 Essential (primary) hypertension: Secondary | ICD-10-CM | POA: Diagnosis not present

## 2022-02-02 DIAGNOSIS — F0390 Unspecified dementia without behavioral disturbance: Secondary | ICD-10-CM | POA: Diagnosis not present

## 2022-02-02 DIAGNOSIS — R2689 Other abnormalities of gait and mobility: Secondary | ICD-10-CM | POA: Diagnosis not present

## 2022-02-02 DIAGNOSIS — R279 Unspecified lack of coordination: Secondary | ICD-10-CM | POA: Diagnosis not present

## 2022-02-02 DIAGNOSIS — R41841 Cognitive communication deficit: Secondary | ICD-10-CM | POA: Diagnosis not present

## 2022-02-03 DIAGNOSIS — I480 Paroxysmal atrial fibrillation: Secondary | ICD-10-CM | POA: Diagnosis not present

## 2022-02-03 DIAGNOSIS — R1312 Dysphagia, oropharyngeal phase: Secondary | ICD-10-CM | POA: Diagnosis not present

## 2022-02-03 DIAGNOSIS — I10 Essential (primary) hypertension: Secondary | ICD-10-CM | POA: Diagnosis not present

## 2022-02-03 DIAGNOSIS — F0390 Unspecified dementia without behavioral disturbance: Secondary | ICD-10-CM | POA: Diagnosis not present

## 2022-02-03 DIAGNOSIS — Z9181 History of falling: Secondary | ICD-10-CM | POA: Diagnosis not present

## 2022-02-03 DIAGNOSIS — R41841 Cognitive communication deficit: Secondary | ICD-10-CM | POA: Diagnosis not present

## 2022-02-04 DIAGNOSIS — Z9181 History of falling: Secondary | ICD-10-CM | POA: Diagnosis not present

## 2022-02-04 DIAGNOSIS — I10 Essential (primary) hypertension: Secondary | ICD-10-CM | POA: Diagnosis not present

## 2022-02-04 DIAGNOSIS — R1312 Dysphagia, oropharyngeal phase: Secondary | ICD-10-CM | POA: Diagnosis not present

## 2022-02-04 DIAGNOSIS — I480 Paroxysmal atrial fibrillation: Secondary | ICD-10-CM | POA: Diagnosis not present

## 2022-02-04 DIAGNOSIS — F0390 Unspecified dementia without behavioral disturbance: Secondary | ICD-10-CM | POA: Diagnosis not present

## 2022-02-04 DIAGNOSIS — R41841 Cognitive communication deficit: Secondary | ICD-10-CM | POA: Diagnosis not present

## 2022-02-05 DIAGNOSIS — I10 Essential (primary) hypertension: Secondary | ICD-10-CM | POA: Diagnosis not present

## 2022-02-05 DIAGNOSIS — I480 Paroxysmal atrial fibrillation: Secondary | ICD-10-CM | POA: Diagnosis not present

## 2022-02-05 DIAGNOSIS — Z9181 History of falling: Secondary | ICD-10-CM | POA: Diagnosis not present

## 2022-02-05 DIAGNOSIS — R1312 Dysphagia, oropharyngeal phase: Secondary | ICD-10-CM | POA: Diagnosis not present

## 2022-02-05 DIAGNOSIS — F0390 Unspecified dementia without behavioral disturbance: Secondary | ICD-10-CM | POA: Diagnosis not present

## 2022-02-05 DIAGNOSIS — R41841 Cognitive communication deficit: Secondary | ICD-10-CM | POA: Diagnosis not present

## 2022-02-06 DIAGNOSIS — I480 Paroxysmal atrial fibrillation: Secondary | ICD-10-CM | POA: Diagnosis not present

## 2022-02-06 DIAGNOSIS — R1312 Dysphagia, oropharyngeal phase: Secondary | ICD-10-CM | POA: Diagnosis not present

## 2022-02-06 DIAGNOSIS — R41841 Cognitive communication deficit: Secondary | ICD-10-CM | POA: Diagnosis not present

## 2022-02-06 DIAGNOSIS — I10 Essential (primary) hypertension: Secondary | ICD-10-CM | POA: Diagnosis not present

## 2022-02-06 DIAGNOSIS — F0390 Unspecified dementia without behavioral disturbance: Secondary | ICD-10-CM | POA: Diagnosis not present

## 2022-02-06 DIAGNOSIS — Z9181 History of falling: Secondary | ICD-10-CM | POA: Diagnosis not present

## 2022-02-09 DIAGNOSIS — I480 Paroxysmal atrial fibrillation: Secondary | ICD-10-CM | POA: Diagnosis not present

## 2022-02-09 DIAGNOSIS — F0390 Unspecified dementia without behavioral disturbance: Secondary | ICD-10-CM | POA: Diagnosis not present

## 2022-02-09 DIAGNOSIS — R41841 Cognitive communication deficit: Secondary | ICD-10-CM | POA: Diagnosis not present

## 2022-02-09 DIAGNOSIS — R1312 Dysphagia, oropharyngeal phase: Secondary | ICD-10-CM | POA: Diagnosis not present

## 2022-02-09 DIAGNOSIS — I10 Essential (primary) hypertension: Secondary | ICD-10-CM | POA: Diagnosis not present

## 2022-02-09 DIAGNOSIS — Z9181 History of falling: Secondary | ICD-10-CM | POA: Diagnosis not present

## 2022-02-10 DIAGNOSIS — R41841 Cognitive communication deficit: Secondary | ICD-10-CM | POA: Diagnosis not present

## 2022-02-10 DIAGNOSIS — Z9181 History of falling: Secondary | ICD-10-CM | POA: Diagnosis not present

## 2022-02-10 DIAGNOSIS — I480 Paroxysmal atrial fibrillation: Secondary | ICD-10-CM | POA: Diagnosis not present

## 2022-02-10 DIAGNOSIS — I10 Essential (primary) hypertension: Secondary | ICD-10-CM | POA: Diagnosis not present

## 2022-02-10 DIAGNOSIS — F0390 Unspecified dementia without behavioral disturbance: Secondary | ICD-10-CM | POA: Diagnosis not present

## 2022-02-10 DIAGNOSIS — R1312 Dysphagia, oropharyngeal phase: Secondary | ICD-10-CM | POA: Diagnosis not present

## 2022-02-11 DIAGNOSIS — I10 Essential (primary) hypertension: Secondary | ICD-10-CM | POA: Diagnosis not present

## 2022-02-11 DIAGNOSIS — F0390 Unspecified dementia without behavioral disturbance: Secondary | ICD-10-CM | POA: Diagnosis not present

## 2022-02-11 DIAGNOSIS — R1312 Dysphagia, oropharyngeal phase: Secondary | ICD-10-CM | POA: Diagnosis not present

## 2022-02-11 DIAGNOSIS — Z9181 History of falling: Secondary | ICD-10-CM | POA: Diagnosis not present

## 2022-02-11 DIAGNOSIS — I480 Paroxysmal atrial fibrillation: Secondary | ICD-10-CM | POA: Diagnosis not present

## 2022-02-11 DIAGNOSIS — R41841 Cognitive communication deficit: Secondary | ICD-10-CM | POA: Diagnosis not present

## 2022-02-12 DIAGNOSIS — R1312 Dysphagia, oropharyngeal phase: Secondary | ICD-10-CM | POA: Diagnosis not present

## 2022-02-12 DIAGNOSIS — I480 Paroxysmal atrial fibrillation: Secondary | ICD-10-CM | POA: Diagnosis not present

## 2022-02-12 DIAGNOSIS — I10 Essential (primary) hypertension: Secondary | ICD-10-CM | POA: Diagnosis not present

## 2022-02-12 DIAGNOSIS — F0390 Unspecified dementia without behavioral disturbance: Secondary | ICD-10-CM | POA: Diagnosis not present

## 2022-02-12 DIAGNOSIS — Z9181 History of falling: Secondary | ICD-10-CM | POA: Diagnosis not present

## 2022-02-12 DIAGNOSIS — R41841 Cognitive communication deficit: Secondary | ICD-10-CM | POA: Diagnosis not present

## 2022-02-13 DIAGNOSIS — I10 Essential (primary) hypertension: Secondary | ICD-10-CM | POA: Diagnosis not present

## 2022-02-13 DIAGNOSIS — F0390 Unspecified dementia without behavioral disturbance: Secondary | ICD-10-CM | POA: Diagnosis not present

## 2022-02-13 DIAGNOSIS — R1312 Dysphagia, oropharyngeal phase: Secondary | ICD-10-CM | POA: Diagnosis not present

## 2022-02-13 DIAGNOSIS — Z9181 History of falling: Secondary | ICD-10-CM | POA: Diagnosis not present

## 2022-02-13 DIAGNOSIS — I480 Paroxysmal atrial fibrillation: Secondary | ICD-10-CM | POA: Diagnosis not present

## 2022-02-13 DIAGNOSIS — R41841 Cognitive communication deficit: Secondary | ICD-10-CM | POA: Diagnosis not present

## 2022-02-16 DIAGNOSIS — Z9181 History of falling: Secondary | ICD-10-CM | POA: Diagnosis not present

## 2022-02-16 DIAGNOSIS — I480 Paroxysmal atrial fibrillation: Secondary | ICD-10-CM | POA: Diagnosis not present

## 2022-02-16 DIAGNOSIS — F0390 Unspecified dementia without behavioral disturbance: Secondary | ICD-10-CM | POA: Diagnosis not present

## 2022-02-16 DIAGNOSIS — R41841 Cognitive communication deficit: Secondary | ICD-10-CM | POA: Diagnosis not present

## 2022-02-16 DIAGNOSIS — R1312 Dysphagia, oropharyngeal phase: Secondary | ICD-10-CM | POA: Diagnosis not present

## 2022-02-16 DIAGNOSIS — I10 Essential (primary) hypertension: Secondary | ICD-10-CM | POA: Diagnosis not present

## 2022-02-20 DIAGNOSIS — R41841 Cognitive communication deficit: Secondary | ICD-10-CM | POA: Diagnosis not present

## 2022-02-20 DIAGNOSIS — F0390 Unspecified dementia without behavioral disturbance: Secondary | ICD-10-CM | POA: Diagnosis not present

## 2022-02-20 DIAGNOSIS — I480 Paroxysmal atrial fibrillation: Secondary | ICD-10-CM | POA: Diagnosis not present

## 2022-02-20 DIAGNOSIS — R1312 Dysphagia, oropharyngeal phase: Secondary | ICD-10-CM | POA: Diagnosis not present

## 2022-02-20 DIAGNOSIS — Z9181 History of falling: Secondary | ICD-10-CM | POA: Diagnosis not present

## 2022-02-20 DIAGNOSIS — I10 Essential (primary) hypertension: Secondary | ICD-10-CM | POA: Diagnosis not present

## 2022-02-24 DIAGNOSIS — I480 Paroxysmal atrial fibrillation: Secondary | ICD-10-CM | POA: Diagnosis not present

## 2022-02-24 DIAGNOSIS — Z9181 History of falling: Secondary | ICD-10-CM | POA: Diagnosis not present

## 2022-02-24 DIAGNOSIS — R41841 Cognitive communication deficit: Secondary | ICD-10-CM | POA: Diagnosis not present

## 2022-02-24 DIAGNOSIS — I10 Essential (primary) hypertension: Secondary | ICD-10-CM | POA: Diagnosis not present

## 2022-02-24 DIAGNOSIS — F0390 Unspecified dementia without behavioral disturbance: Secondary | ICD-10-CM | POA: Diagnosis not present

## 2022-02-24 DIAGNOSIS — R1312 Dysphagia, oropharyngeal phase: Secondary | ICD-10-CM | POA: Diagnosis not present

## 2022-02-25 DIAGNOSIS — R41841 Cognitive communication deficit: Secondary | ICD-10-CM | POA: Diagnosis not present

## 2022-02-25 DIAGNOSIS — F0390 Unspecified dementia without behavioral disturbance: Secondary | ICD-10-CM | POA: Diagnosis not present

## 2022-02-25 DIAGNOSIS — I480 Paroxysmal atrial fibrillation: Secondary | ICD-10-CM | POA: Diagnosis not present

## 2022-02-25 DIAGNOSIS — I10 Essential (primary) hypertension: Secondary | ICD-10-CM | POA: Diagnosis not present

## 2022-02-25 DIAGNOSIS — Z9181 History of falling: Secondary | ICD-10-CM | POA: Diagnosis not present

## 2022-02-25 DIAGNOSIS — R1312 Dysphagia, oropharyngeal phase: Secondary | ICD-10-CM | POA: Diagnosis not present

## 2022-03-03 ENCOUNTER — Ambulatory Visit (INDEPENDENT_AMBULATORY_CARE_PROVIDER_SITE_OTHER): Payer: Medicare Other

## 2022-03-03 DIAGNOSIS — I441 Atrioventricular block, second degree: Secondary | ICD-10-CM | POA: Diagnosis not present

## 2022-03-03 LAB — CUP PACEART REMOTE DEVICE CHECK
Battery Remaining Longevity: 59 mo
Battery Remaining Percentage: 51 %
Battery Voltage: 3.02 V
Brady Statistic AP VP Percent: 1 %
Brady Statistic AP VS Percent: 94 %
Brady Statistic AS VP Percent: 1 %
Brady Statistic AS VS Percent: 6.3 %
Brady Statistic RA Percent Paced: 93 %
Brady Statistic RV Percent Paced: 1 %
Date Time Interrogation Session: 20230530024230
Implantable Lead Implant Date: 20180524
Implantable Lead Implant Date: 20180524
Implantable Lead Location: 753859
Implantable Lead Location: 753860
Implantable Lead Model: 3830
Implantable Pulse Generator Implant Date: 20180524
Lead Channel Impedance Value: 510 Ohm
Lead Channel Impedance Value: 530 Ohm
Lead Channel Pacing Threshold Amplitude: 0.75 V
Lead Channel Pacing Threshold Amplitude: 0.75 V
Lead Channel Pacing Threshold Pulse Width: 0.5 ms
Lead Channel Pacing Threshold Pulse Width: 0.5 ms
Lead Channel Sensing Intrinsic Amplitude: 10.5 mV
Lead Channel Sensing Intrinsic Amplitude: 2.2 mV
Lead Channel Setting Pacing Amplitude: 2 V
Lead Channel Setting Pacing Amplitude: 2.5 V
Lead Channel Setting Pacing Pulse Width: 0.5 ms
Lead Channel Setting Sensing Sensitivity: 2 mV
Pulse Gen Model: 2272
Pulse Gen Serial Number: 8911338

## 2022-03-09 DIAGNOSIS — N39498 Other specified urinary incontinence: Secondary | ICD-10-CM | POA: Diagnosis not present

## 2022-03-09 DIAGNOSIS — R300 Dysuria: Secondary | ICD-10-CM | POA: Diagnosis not present

## 2022-03-12 DIAGNOSIS — I25119 Atherosclerotic heart disease of native coronary artery with unspecified angina pectoris: Secondary | ICD-10-CM | POA: Diagnosis not present

## 2022-03-12 DIAGNOSIS — E039 Hypothyroidism, unspecified: Secondary | ICD-10-CM | POA: Diagnosis not present

## 2022-03-12 DIAGNOSIS — I4891 Unspecified atrial fibrillation: Secondary | ICD-10-CM | POA: Diagnosis not present

## 2022-03-12 DIAGNOSIS — F0390 Unspecified dementia without behavioral disturbance: Secondary | ICD-10-CM | POA: Diagnosis not present

## 2022-03-19 NOTE — Progress Notes (Signed)
Remote pacemaker transmission.   

## 2022-04-08 ENCOUNTER — Encounter: Payer: Self-pay | Admitting: Internal Medicine

## 2022-04-08 DIAGNOSIS — R413 Other amnesia: Secondary | ICD-10-CM | POA: Diagnosis not present

## 2022-04-08 DIAGNOSIS — F03A0 Unspecified dementia, mild, without behavioral disturbance, psychotic disturbance, mood disturbance, and anxiety: Secondary | ICD-10-CM | POA: Diagnosis not present

## 2022-04-09 DIAGNOSIS — M25532 Pain in left wrist: Secondary | ICD-10-CM | POA: Diagnosis not present

## 2022-04-09 DIAGNOSIS — M25522 Pain in left elbow: Secondary | ICD-10-CM | POA: Diagnosis not present

## 2022-04-11 NOTE — Telephone Encounter (Signed)
We should have her increase to 5 mg daily and she should followup with her PCP within about 2 weeks for review and further meds which I think will be likely Thanks SK

## 2022-04-13 ENCOUNTER — Other Ambulatory Visit: Payer: Self-pay | Admitting: *Deleted

## 2022-04-13 DIAGNOSIS — R41841 Cognitive communication deficit: Secondary | ICD-10-CM | POA: Diagnosis not present

## 2022-04-13 DIAGNOSIS — M25532 Pain in left wrist: Secondary | ICD-10-CM | POA: Diagnosis not present

## 2022-04-13 DIAGNOSIS — R262 Difficulty in walking, not elsewhere classified: Secondary | ICD-10-CM | POA: Diagnosis not present

## 2022-04-13 DIAGNOSIS — F0390 Unspecified dementia without behavioral disturbance: Secondary | ICD-10-CM | POA: Diagnosis not present

## 2022-04-13 DIAGNOSIS — I25119 Atherosclerotic heart disease of native coronary artery with unspecified angina pectoris: Secondary | ICD-10-CM | POA: Diagnosis not present

## 2022-04-13 DIAGNOSIS — Z9181 History of falling: Secondary | ICD-10-CM | POA: Diagnosis not present

## 2022-04-13 DIAGNOSIS — I10 Essential (primary) hypertension: Secondary | ICD-10-CM | POA: Diagnosis not present

## 2022-04-13 DIAGNOSIS — R279 Unspecified lack of coordination: Secondary | ICD-10-CM | POA: Diagnosis not present

## 2022-04-13 DIAGNOSIS — M6281 Muscle weakness (generalized): Secondary | ICD-10-CM | POA: Diagnosis not present

## 2022-04-13 DIAGNOSIS — R2689 Other abnormalities of gait and mobility: Secondary | ICD-10-CM | POA: Diagnosis not present

## 2022-04-13 MED ORDER — LISINOPRIL 5 MG PO TABS: 5.0000 mg | ORAL_TABLET | Freq: Every day | ORAL | 11 refills | Status: DC

## 2022-04-14 DIAGNOSIS — R279 Unspecified lack of coordination: Secondary | ICD-10-CM | POA: Diagnosis not present

## 2022-04-14 DIAGNOSIS — Z9181 History of falling: Secondary | ICD-10-CM | POA: Diagnosis not present

## 2022-04-14 DIAGNOSIS — F0390 Unspecified dementia without behavioral disturbance: Secondary | ICD-10-CM | POA: Diagnosis not present

## 2022-04-14 DIAGNOSIS — R2689 Other abnormalities of gait and mobility: Secondary | ICD-10-CM | POA: Diagnosis not present

## 2022-04-14 DIAGNOSIS — M6281 Muscle weakness (generalized): Secondary | ICD-10-CM | POA: Diagnosis not present

## 2022-04-14 DIAGNOSIS — M25532 Pain in left wrist: Secondary | ICD-10-CM | POA: Diagnosis not present

## 2022-04-15 DIAGNOSIS — R2689 Other abnormalities of gait and mobility: Secondary | ICD-10-CM | POA: Diagnosis not present

## 2022-04-15 DIAGNOSIS — M25532 Pain in left wrist: Secondary | ICD-10-CM | POA: Diagnosis not present

## 2022-04-15 DIAGNOSIS — Z9181 History of falling: Secondary | ICD-10-CM | POA: Diagnosis not present

## 2022-04-15 DIAGNOSIS — F0390 Unspecified dementia without behavioral disturbance: Secondary | ICD-10-CM | POA: Diagnosis not present

## 2022-04-15 DIAGNOSIS — M6281 Muscle weakness (generalized): Secondary | ICD-10-CM | POA: Diagnosis not present

## 2022-04-15 DIAGNOSIS — R279 Unspecified lack of coordination: Secondary | ICD-10-CM | POA: Diagnosis not present

## 2022-04-16 DIAGNOSIS — Z9181 History of falling: Secondary | ICD-10-CM | POA: Diagnosis not present

## 2022-04-16 DIAGNOSIS — M6281 Muscle weakness (generalized): Secondary | ICD-10-CM | POA: Diagnosis not present

## 2022-04-16 DIAGNOSIS — F0390 Unspecified dementia without behavioral disturbance: Secondary | ICD-10-CM | POA: Diagnosis not present

## 2022-04-16 DIAGNOSIS — R279 Unspecified lack of coordination: Secondary | ICD-10-CM | POA: Diagnosis not present

## 2022-04-16 DIAGNOSIS — M25532 Pain in left wrist: Secondary | ICD-10-CM | POA: Diagnosis not present

## 2022-04-16 DIAGNOSIS — R2689 Other abnormalities of gait and mobility: Secondary | ICD-10-CM | POA: Diagnosis not present

## 2022-04-17 DIAGNOSIS — M25532 Pain in left wrist: Secondary | ICD-10-CM | POA: Diagnosis not present

## 2022-04-17 DIAGNOSIS — Z9181 History of falling: Secondary | ICD-10-CM | POA: Diagnosis not present

## 2022-04-17 DIAGNOSIS — R2689 Other abnormalities of gait and mobility: Secondary | ICD-10-CM | POA: Diagnosis not present

## 2022-04-17 DIAGNOSIS — F0390 Unspecified dementia without behavioral disturbance: Secondary | ICD-10-CM | POA: Diagnosis not present

## 2022-04-17 DIAGNOSIS — M6281 Muscle weakness (generalized): Secondary | ICD-10-CM | POA: Diagnosis not present

## 2022-04-17 DIAGNOSIS — R279 Unspecified lack of coordination: Secondary | ICD-10-CM | POA: Diagnosis not present

## 2022-04-20 DIAGNOSIS — R2689 Other abnormalities of gait and mobility: Secondary | ICD-10-CM | POA: Diagnosis not present

## 2022-04-20 DIAGNOSIS — Z9181 History of falling: Secondary | ICD-10-CM | POA: Diagnosis not present

## 2022-04-20 DIAGNOSIS — M25532 Pain in left wrist: Secondary | ICD-10-CM | POA: Diagnosis not present

## 2022-04-20 DIAGNOSIS — R279 Unspecified lack of coordination: Secondary | ICD-10-CM | POA: Diagnosis not present

## 2022-04-20 DIAGNOSIS — M6281 Muscle weakness (generalized): Secondary | ICD-10-CM | POA: Diagnosis not present

## 2022-04-20 DIAGNOSIS — F0390 Unspecified dementia without behavioral disturbance: Secondary | ICD-10-CM | POA: Diagnosis not present

## 2022-04-21 DIAGNOSIS — Z9181 History of falling: Secondary | ICD-10-CM | POA: Diagnosis not present

## 2022-04-21 DIAGNOSIS — R279 Unspecified lack of coordination: Secondary | ICD-10-CM | POA: Diagnosis not present

## 2022-04-21 DIAGNOSIS — M6281 Muscle weakness (generalized): Secondary | ICD-10-CM | POA: Diagnosis not present

## 2022-04-21 DIAGNOSIS — I10 Essential (primary) hypertension: Secondary | ICD-10-CM | POA: Diagnosis not present

## 2022-04-21 DIAGNOSIS — M25532 Pain in left wrist: Secondary | ICD-10-CM | POA: Diagnosis not present

## 2022-04-21 DIAGNOSIS — F0390 Unspecified dementia without behavioral disturbance: Secondary | ICD-10-CM | POA: Diagnosis not present

## 2022-04-21 DIAGNOSIS — R2689 Other abnormalities of gait and mobility: Secondary | ICD-10-CM | POA: Diagnosis not present

## 2022-04-22 DIAGNOSIS — M25532 Pain in left wrist: Secondary | ICD-10-CM | POA: Diagnosis not present

## 2022-04-22 DIAGNOSIS — M6281 Muscle weakness (generalized): Secondary | ICD-10-CM | POA: Diagnosis not present

## 2022-04-22 DIAGNOSIS — F0390 Unspecified dementia without behavioral disturbance: Secondary | ICD-10-CM | POA: Diagnosis not present

## 2022-04-22 DIAGNOSIS — Z9181 History of falling: Secondary | ICD-10-CM | POA: Diagnosis not present

## 2022-04-22 DIAGNOSIS — R2689 Other abnormalities of gait and mobility: Secondary | ICD-10-CM | POA: Diagnosis not present

## 2022-04-22 DIAGNOSIS — R279 Unspecified lack of coordination: Secondary | ICD-10-CM | POA: Diagnosis not present

## 2022-04-23 DIAGNOSIS — Z9181 History of falling: Secondary | ICD-10-CM | POA: Diagnosis not present

## 2022-04-23 DIAGNOSIS — R2689 Other abnormalities of gait and mobility: Secondary | ICD-10-CM | POA: Diagnosis not present

## 2022-04-23 DIAGNOSIS — M6281 Muscle weakness (generalized): Secondary | ICD-10-CM | POA: Diagnosis not present

## 2022-04-23 DIAGNOSIS — F0390 Unspecified dementia without behavioral disturbance: Secondary | ICD-10-CM | POA: Diagnosis not present

## 2022-04-23 DIAGNOSIS — M25532 Pain in left wrist: Secondary | ICD-10-CM | POA: Diagnosis not present

## 2022-04-23 DIAGNOSIS — R279 Unspecified lack of coordination: Secondary | ICD-10-CM | POA: Diagnosis not present

## 2022-04-24 DIAGNOSIS — R279 Unspecified lack of coordination: Secondary | ICD-10-CM | POA: Diagnosis not present

## 2022-04-24 DIAGNOSIS — Z9181 History of falling: Secondary | ICD-10-CM | POA: Diagnosis not present

## 2022-04-24 DIAGNOSIS — R2689 Other abnormalities of gait and mobility: Secondary | ICD-10-CM | POA: Diagnosis not present

## 2022-04-24 DIAGNOSIS — M6281 Muscle weakness (generalized): Secondary | ICD-10-CM | POA: Diagnosis not present

## 2022-04-24 DIAGNOSIS — M25532 Pain in left wrist: Secondary | ICD-10-CM | POA: Diagnosis not present

## 2022-04-24 DIAGNOSIS — F0390 Unspecified dementia without behavioral disturbance: Secondary | ICD-10-CM | POA: Diagnosis not present

## 2022-04-27 DIAGNOSIS — R2689 Other abnormalities of gait and mobility: Secondary | ICD-10-CM | POA: Diagnosis not present

## 2022-04-27 DIAGNOSIS — R279 Unspecified lack of coordination: Secondary | ICD-10-CM | POA: Diagnosis not present

## 2022-04-27 DIAGNOSIS — Z9181 History of falling: Secondary | ICD-10-CM | POA: Diagnosis not present

## 2022-04-27 DIAGNOSIS — F0390 Unspecified dementia without behavioral disturbance: Secondary | ICD-10-CM | POA: Diagnosis not present

## 2022-04-27 DIAGNOSIS — M25532 Pain in left wrist: Secondary | ICD-10-CM | POA: Diagnosis not present

## 2022-04-27 DIAGNOSIS — M6281 Muscle weakness (generalized): Secondary | ICD-10-CM | POA: Diagnosis not present

## 2022-04-28 DIAGNOSIS — M25532 Pain in left wrist: Secondary | ICD-10-CM | POA: Diagnosis not present

## 2022-04-28 DIAGNOSIS — Z9181 History of falling: Secondary | ICD-10-CM | POA: Diagnosis not present

## 2022-04-28 DIAGNOSIS — R2689 Other abnormalities of gait and mobility: Secondary | ICD-10-CM | POA: Diagnosis not present

## 2022-04-28 DIAGNOSIS — F0390 Unspecified dementia without behavioral disturbance: Secondary | ICD-10-CM | POA: Diagnosis not present

## 2022-04-28 DIAGNOSIS — R279 Unspecified lack of coordination: Secondary | ICD-10-CM | POA: Diagnosis not present

## 2022-04-28 DIAGNOSIS — M6281 Muscle weakness (generalized): Secondary | ICD-10-CM | POA: Diagnosis not present

## 2022-04-30 DIAGNOSIS — F0390 Unspecified dementia without behavioral disturbance: Secondary | ICD-10-CM | POA: Diagnosis not present

## 2022-04-30 DIAGNOSIS — Z9181 History of falling: Secondary | ICD-10-CM | POA: Diagnosis not present

## 2022-04-30 DIAGNOSIS — R2689 Other abnormalities of gait and mobility: Secondary | ICD-10-CM | POA: Diagnosis not present

## 2022-04-30 DIAGNOSIS — M6281 Muscle weakness (generalized): Secondary | ICD-10-CM | POA: Diagnosis not present

## 2022-04-30 DIAGNOSIS — M25532 Pain in left wrist: Secondary | ICD-10-CM | POA: Diagnosis not present

## 2022-04-30 DIAGNOSIS — R279 Unspecified lack of coordination: Secondary | ICD-10-CM | POA: Diagnosis not present

## 2022-05-01 DIAGNOSIS — R279 Unspecified lack of coordination: Secondary | ICD-10-CM | POA: Diagnosis not present

## 2022-05-01 DIAGNOSIS — M25532 Pain in left wrist: Secondary | ICD-10-CM | POA: Diagnosis not present

## 2022-05-01 DIAGNOSIS — R2689 Other abnormalities of gait and mobility: Secondary | ICD-10-CM | POA: Diagnosis not present

## 2022-05-01 DIAGNOSIS — F0390 Unspecified dementia without behavioral disturbance: Secondary | ICD-10-CM | POA: Diagnosis not present

## 2022-05-01 DIAGNOSIS — Z9181 History of falling: Secondary | ICD-10-CM | POA: Diagnosis not present

## 2022-05-01 DIAGNOSIS — M6281 Muscle weakness (generalized): Secondary | ICD-10-CM | POA: Diagnosis not present

## 2022-05-04 DIAGNOSIS — M25532 Pain in left wrist: Secondary | ICD-10-CM | POA: Diagnosis not present

## 2022-05-04 DIAGNOSIS — M6281 Muscle weakness (generalized): Secondary | ICD-10-CM | POA: Diagnosis not present

## 2022-05-04 DIAGNOSIS — F0390 Unspecified dementia without behavioral disturbance: Secondary | ICD-10-CM | POA: Diagnosis not present

## 2022-05-04 DIAGNOSIS — Z9181 History of falling: Secondary | ICD-10-CM | POA: Diagnosis not present

## 2022-05-04 DIAGNOSIS — R2689 Other abnormalities of gait and mobility: Secondary | ICD-10-CM | POA: Diagnosis not present

## 2022-05-04 DIAGNOSIS — R279 Unspecified lack of coordination: Secondary | ICD-10-CM | POA: Diagnosis not present

## 2022-05-06 DIAGNOSIS — R41841 Cognitive communication deficit: Secondary | ICD-10-CM | POA: Diagnosis not present

## 2022-05-06 DIAGNOSIS — R262 Difficulty in walking, not elsewhere classified: Secondary | ICD-10-CM | POA: Diagnosis not present

## 2022-05-06 DIAGNOSIS — R4181 Age-related cognitive decline: Secondary | ICD-10-CM | POA: Diagnosis not present

## 2022-05-06 DIAGNOSIS — I10 Essential (primary) hypertension: Secondary | ICD-10-CM | POA: Diagnosis not present

## 2022-05-06 DIAGNOSIS — R1312 Dysphagia, oropharyngeal phase: Secondary | ICD-10-CM | POA: Diagnosis not present

## 2022-05-06 DIAGNOSIS — R279 Unspecified lack of coordination: Secondary | ICD-10-CM | POA: Diagnosis not present

## 2022-05-06 DIAGNOSIS — M25532 Pain in left wrist: Secondary | ICD-10-CM | POA: Diagnosis not present

## 2022-05-06 DIAGNOSIS — F02A0 Dementia in other diseases classified elsewhere, mild, without behavioral disturbance, psychotic disturbance, mood disturbance, and anxiety: Secondary | ICD-10-CM | POA: Diagnosis not present

## 2022-05-06 DIAGNOSIS — M6281 Muscle weakness (generalized): Secondary | ICD-10-CM | POA: Diagnosis not present

## 2022-05-06 DIAGNOSIS — R2689 Other abnormalities of gait and mobility: Secondary | ICD-10-CM | POA: Diagnosis not present

## 2022-05-06 DIAGNOSIS — Z9181 History of falling: Secondary | ICD-10-CM | POA: Diagnosis not present

## 2022-05-06 DIAGNOSIS — I25119 Atherosclerotic heart disease of native coronary artery with unspecified angina pectoris: Secondary | ICD-10-CM | POA: Diagnosis not present

## 2022-05-07 DIAGNOSIS — M6281 Muscle weakness (generalized): Secondary | ICD-10-CM | POA: Diagnosis not present

## 2022-05-07 DIAGNOSIS — F02A0 Dementia in other diseases classified elsewhere, mild, without behavioral disturbance, psychotic disturbance, mood disturbance, and anxiety: Secondary | ICD-10-CM | POA: Diagnosis not present

## 2022-05-07 DIAGNOSIS — R2689 Other abnormalities of gait and mobility: Secondary | ICD-10-CM | POA: Diagnosis not present

## 2022-05-07 DIAGNOSIS — M25532 Pain in left wrist: Secondary | ICD-10-CM | POA: Diagnosis not present

## 2022-05-07 DIAGNOSIS — Z9181 History of falling: Secondary | ICD-10-CM | POA: Diagnosis not present

## 2022-05-07 DIAGNOSIS — R279 Unspecified lack of coordination: Secondary | ICD-10-CM | POA: Diagnosis not present

## 2022-05-08 DIAGNOSIS — Z9181 History of falling: Secondary | ICD-10-CM | POA: Diagnosis not present

## 2022-05-08 DIAGNOSIS — F02A0 Dementia in other diseases classified elsewhere, mild, without behavioral disturbance, psychotic disturbance, mood disturbance, and anxiety: Secondary | ICD-10-CM | POA: Diagnosis not present

## 2022-05-08 DIAGNOSIS — M25532 Pain in left wrist: Secondary | ICD-10-CM | POA: Diagnosis not present

## 2022-05-08 DIAGNOSIS — R279 Unspecified lack of coordination: Secondary | ICD-10-CM | POA: Diagnosis not present

## 2022-05-08 DIAGNOSIS — M6281 Muscle weakness (generalized): Secondary | ICD-10-CM | POA: Diagnosis not present

## 2022-05-08 DIAGNOSIS — R2689 Other abnormalities of gait and mobility: Secondary | ICD-10-CM | POA: Diagnosis not present

## 2022-05-11 DIAGNOSIS — R2689 Other abnormalities of gait and mobility: Secondary | ICD-10-CM | POA: Diagnosis not present

## 2022-05-11 DIAGNOSIS — M25532 Pain in left wrist: Secondary | ICD-10-CM | POA: Diagnosis not present

## 2022-05-11 DIAGNOSIS — F02A0 Dementia in other diseases classified elsewhere, mild, without behavioral disturbance, psychotic disturbance, mood disturbance, and anxiety: Secondary | ICD-10-CM | POA: Diagnosis not present

## 2022-05-11 DIAGNOSIS — Z9181 History of falling: Secondary | ICD-10-CM | POA: Diagnosis not present

## 2022-05-11 DIAGNOSIS — R279 Unspecified lack of coordination: Secondary | ICD-10-CM | POA: Diagnosis not present

## 2022-05-11 DIAGNOSIS — M6281 Muscle weakness (generalized): Secondary | ICD-10-CM | POA: Diagnosis not present

## 2022-05-12 DIAGNOSIS — R279 Unspecified lack of coordination: Secondary | ICD-10-CM | POA: Diagnosis not present

## 2022-05-12 DIAGNOSIS — M6281 Muscle weakness (generalized): Secondary | ICD-10-CM | POA: Diagnosis not present

## 2022-05-12 DIAGNOSIS — M25532 Pain in left wrist: Secondary | ICD-10-CM | POA: Diagnosis not present

## 2022-05-12 DIAGNOSIS — R2689 Other abnormalities of gait and mobility: Secondary | ICD-10-CM | POA: Diagnosis not present

## 2022-05-12 DIAGNOSIS — F02A0 Dementia in other diseases classified elsewhere, mild, without behavioral disturbance, psychotic disturbance, mood disturbance, and anxiety: Secondary | ICD-10-CM | POA: Diagnosis not present

## 2022-05-12 DIAGNOSIS — Z9181 History of falling: Secondary | ICD-10-CM | POA: Diagnosis not present

## 2022-05-14 DIAGNOSIS — M6281 Muscle weakness (generalized): Secondary | ICD-10-CM | POA: Diagnosis not present

## 2022-05-14 DIAGNOSIS — F02A0 Dementia in other diseases classified elsewhere, mild, without behavioral disturbance, psychotic disturbance, mood disturbance, and anxiety: Secondary | ICD-10-CM | POA: Diagnosis not present

## 2022-05-14 DIAGNOSIS — R2689 Other abnormalities of gait and mobility: Secondary | ICD-10-CM | POA: Diagnosis not present

## 2022-05-14 DIAGNOSIS — Z9181 History of falling: Secondary | ICD-10-CM | POA: Diagnosis not present

## 2022-05-14 DIAGNOSIS — M25532 Pain in left wrist: Secondary | ICD-10-CM | POA: Diagnosis not present

## 2022-05-14 DIAGNOSIS — R279 Unspecified lack of coordination: Secondary | ICD-10-CM | POA: Diagnosis not present

## 2022-05-15 DIAGNOSIS — M25532 Pain in left wrist: Secondary | ICD-10-CM | POA: Diagnosis not present

## 2022-05-15 DIAGNOSIS — M6281 Muscle weakness (generalized): Secondary | ICD-10-CM | POA: Diagnosis not present

## 2022-05-15 DIAGNOSIS — Z9181 History of falling: Secondary | ICD-10-CM | POA: Diagnosis not present

## 2022-05-15 DIAGNOSIS — R279 Unspecified lack of coordination: Secondary | ICD-10-CM | POA: Diagnosis not present

## 2022-05-15 DIAGNOSIS — F02A0 Dementia in other diseases classified elsewhere, mild, without behavioral disturbance, psychotic disturbance, mood disturbance, and anxiety: Secondary | ICD-10-CM | POA: Diagnosis not present

## 2022-05-15 DIAGNOSIS — R2689 Other abnormalities of gait and mobility: Secondary | ICD-10-CM | POA: Diagnosis not present

## 2022-05-19 DIAGNOSIS — R2689 Other abnormalities of gait and mobility: Secondary | ICD-10-CM | POA: Diagnosis not present

## 2022-05-19 DIAGNOSIS — M6281 Muscle weakness (generalized): Secondary | ICD-10-CM | POA: Diagnosis not present

## 2022-05-19 DIAGNOSIS — R279 Unspecified lack of coordination: Secondary | ICD-10-CM | POA: Diagnosis not present

## 2022-05-19 DIAGNOSIS — F02A0 Dementia in other diseases classified elsewhere, mild, without behavioral disturbance, psychotic disturbance, mood disturbance, and anxiety: Secondary | ICD-10-CM | POA: Diagnosis not present

## 2022-05-19 DIAGNOSIS — Z9181 History of falling: Secondary | ICD-10-CM | POA: Diagnosis not present

## 2022-05-19 DIAGNOSIS — M25532 Pain in left wrist: Secondary | ICD-10-CM | POA: Diagnosis not present

## 2022-05-20 DIAGNOSIS — R279 Unspecified lack of coordination: Secondary | ICD-10-CM | POA: Diagnosis not present

## 2022-05-20 DIAGNOSIS — I4891 Unspecified atrial fibrillation: Secondary | ICD-10-CM | POA: Diagnosis not present

## 2022-05-20 DIAGNOSIS — G301 Alzheimer's disease with late onset: Secondary | ICD-10-CM | POA: Diagnosis not present

## 2022-05-20 DIAGNOSIS — Z9181 History of falling: Secondary | ICD-10-CM | POA: Diagnosis not present

## 2022-05-20 DIAGNOSIS — M25532 Pain in left wrist: Secondary | ICD-10-CM | POA: Diagnosis not present

## 2022-05-20 DIAGNOSIS — M6281 Muscle weakness (generalized): Secondary | ICD-10-CM | POA: Diagnosis not present

## 2022-05-20 DIAGNOSIS — R2689 Other abnormalities of gait and mobility: Secondary | ICD-10-CM | POA: Diagnosis not present

## 2022-05-20 DIAGNOSIS — I25119 Atherosclerotic heart disease of native coronary artery with unspecified angina pectoris: Secondary | ICD-10-CM | POA: Diagnosis not present

## 2022-05-20 DIAGNOSIS — E039 Hypothyroidism, unspecified: Secondary | ICD-10-CM | POA: Diagnosis not present

## 2022-05-20 DIAGNOSIS — F02A0 Dementia in other diseases classified elsewhere, mild, without behavioral disturbance, psychotic disturbance, mood disturbance, and anxiety: Secondary | ICD-10-CM | POA: Diagnosis not present

## 2022-05-21 DIAGNOSIS — R2689 Other abnormalities of gait and mobility: Secondary | ICD-10-CM | POA: Diagnosis not present

## 2022-05-21 DIAGNOSIS — R279 Unspecified lack of coordination: Secondary | ICD-10-CM | POA: Diagnosis not present

## 2022-05-21 DIAGNOSIS — M6281 Muscle weakness (generalized): Secondary | ICD-10-CM | POA: Diagnosis not present

## 2022-05-21 DIAGNOSIS — F02A0 Dementia in other diseases classified elsewhere, mild, without behavioral disturbance, psychotic disturbance, mood disturbance, and anxiety: Secondary | ICD-10-CM | POA: Diagnosis not present

## 2022-05-21 DIAGNOSIS — Z9181 History of falling: Secondary | ICD-10-CM | POA: Diagnosis not present

## 2022-05-21 DIAGNOSIS — M25532 Pain in left wrist: Secondary | ICD-10-CM | POA: Diagnosis not present

## 2022-05-22 DIAGNOSIS — F02A0 Dementia in other diseases classified elsewhere, mild, without behavioral disturbance, psychotic disturbance, mood disturbance, and anxiety: Secondary | ICD-10-CM | POA: Diagnosis not present

## 2022-05-22 DIAGNOSIS — Z9181 History of falling: Secondary | ICD-10-CM | POA: Diagnosis not present

## 2022-05-22 DIAGNOSIS — R2689 Other abnormalities of gait and mobility: Secondary | ICD-10-CM | POA: Diagnosis not present

## 2022-05-22 DIAGNOSIS — M6281 Muscle weakness (generalized): Secondary | ICD-10-CM | POA: Diagnosis not present

## 2022-05-22 DIAGNOSIS — M25532 Pain in left wrist: Secondary | ICD-10-CM | POA: Diagnosis not present

## 2022-05-22 DIAGNOSIS — R279 Unspecified lack of coordination: Secondary | ICD-10-CM | POA: Diagnosis not present

## 2022-05-25 DIAGNOSIS — M25532 Pain in left wrist: Secondary | ICD-10-CM | POA: Diagnosis not present

## 2022-05-25 DIAGNOSIS — M6281 Muscle weakness (generalized): Secondary | ICD-10-CM | POA: Diagnosis not present

## 2022-05-25 DIAGNOSIS — R279 Unspecified lack of coordination: Secondary | ICD-10-CM | POA: Diagnosis not present

## 2022-05-25 DIAGNOSIS — Z9181 History of falling: Secondary | ICD-10-CM | POA: Diagnosis not present

## 2022-05-25 DIAGNOSIS — F02A0 Dementia in other diseases classified elsewhere, mild, without behavioral disturbance, psychotic disturbance, mood disturbance, and anxiety: Secondary | ICD-10-CM | POA: Diagnosis not present

## 2022-05-25 DIAGNOSIS — R2689 Other abnormalities of gait and mobility: Secondary | ICD-10-CM | POA: Diagnosis not present

## 2022-05-26 DIAGNOSIS — M25532 Pain in left wrist: Secondary | ICD-10-CM | POA: Diagnosis not present

## 2022-05-26 DIAGNOSIS — R279 Unspecified lack of coordination: Secondary | ICD-10-CM | POA: Diagnosis not present

## 2022-05-26 DIAGNOSIS — R2689 Other abnormalities of gait and mobility: Secondary | ICD-10-CM | POA: Diagnosis not present

## 2022-05-26 DIAGNOSIS — M6281 Muscle weakness (generalized): Secondary | ICD-10-CM | POA: Diagnosis not present

## 2022-05-26 DIAGNOSIS — F02A0 Dementia in other diseases classified elsewhere, mild, without behavioral disturbance, psychotic disturbance, mood disturbance, and anxiety: Secondary | ICD-10-CM | POA: Diagnosis not present

## 2022-05-26 DIAGNOSIS — Z9181 History of falling: Secondary | ICD-10-CM | POA: Diagnosis not present

## 2022-05-27 DIAGNOSIS — R2689 Other abnormalities of gait and mobility: Secondary | ICD-10-CM | POA: Diagnosis not present

## 2022-05-27 DIAGNOSIS — M6281 Muscle weakness (generalized): Secondary | ICD-10-CM | POA: Diagnosis not present

## 2022-05-27 DIAGNOSIS — Z9181 History of falling: Secondary | ICD-10-CM | POA: Diagnosis not present

## 2022-05-27 DIAGNOSIS — M25532 Pain in left wrist: Secondary | ICD-10-CM | POA: Diagnosis not present

## 2022-05-27 DIAGNOSIS — F02A0 Dementia in other diseases classified elsewhere, mild, without behavioral disturbance, psychotic disturbance, mood disturbance, and anxiety: Secondary | ICD-10-CM | POA: Diagnosis not present

## 2022-05-27 DIAGNOSIS — R279 Unspecified lack of coordination: Secondary | ICD-10-CM | POA: Diagnosis not present

## 2022-05-28 DIAGNOSIS — M6281 Muscle weakness (generalized): Secondary | ICD-10-CM | POA: Diagnosis not present

## 2022-05-28 DIAGNOSIS — M25532 Pain in left wrist: Secondary | ICD-10-CM | POA: Diagnosis not present

## 2022-05-28 DIAGNOSIS — R279 Unspecified lack of coordination: Secondary | ICD-10-CM | POA: Diagnosis not present

## 2022-05-28 DIAGNOSIS — F02A0 Dementia in other diseases classified elsewhere, mild, without behavioral disturbance, psychotic disturbance, mood disturbance, and anxiety: Secondary | ICD-10-CM | POA: Diagnosis not present

## 2022-05-28 DIAGNOSIS — Z9181 History of falling: Secondary | ICD-10-CM | POA: Diagnosis not present

## 2022-05-28 DIAGNOSIS — R2689 Other abnormalities of gait and mobility: Secondary | ICD-10-CM | POA: Diagnosis not present

## 2022-05-29 DIAGNOSIS — R279 Unspecified lack of coordination: Secondary | ICD-10-CM | POA: Diagnosis not present

## 2022-05-29 DIAGNOSIS — M25532 Pain in left wrist: Secondary | ICD-10-CM | POA: Diagnosis not present

## 2022-05-29 DIAGNOSIS — Z9181 History of falling: Secondary | ICD-10-CM | POA: Diagnosis not present

## 2022-05-29 DIAGNOSIS — M6281 Muscle weakness (generalized): Secondary | ICD-10-CM | POA: Diagnosis not present

## 2022-05-29 DIAGNOSIS — R2689 Other abnormalities of gait and mobility: Secondary | ICD-10-CM | POA: Diagnosis not present

## 2022-05-29 DIAGNOSIS — F02A0 Dementia in other diseases classified elsewhere, mild, without behavioral disturbance, psychotic disturbance, mood disturbance, and anxiety: Secondary | ICD-10-CM | POA: Diagnosis not present

## 2022-06-01 DIAGNOSIS — Z9181 History of falling: Secondary | ICD-10-CM | POA: Diagnosis not present

## 2022-06-01 DIAGNOSIS — F02A0 Dementia in other diseases classified elsewhere, mild, without behavioral disturbance, psychotic disturbance, mood disturbance, and anxiety: Secondary | ICD-10-CM | POA: Diagnosis not present

## 2022-06-01 DIAGNOSIS — M6281 Muscle weakness (generalized): Secondary | ICD-10-CM | POA: Diagnosis not present

## 2022-06-01 DIAGNOSIS — R279 Unspecified lack of coordination: Secondary | ICD-10-CM | POA: Diagnosis not present

## 2022-06-01 DIAGNOSIS — M25532 Pain in left wrist: Secondary | ICD-10-CM | POA: Diagnosis not present

## 2022-06-01 DIAGNOSIS — R2689 Other abnormalities of gait and mobility: Secondary | ICD-10-CM | POA: Diagnosis not present

## 2022-06-02 ENCOUNTER — Ambulatory Visit (INDEPENDENT_AMBULATORY_CARE_PROVIDER_SITE_OTHER): Payer: Medicare Other

## 2022-06-02 DIAGNOSIS — F02A0 Dementia in other diseases classified elsewhere, mild, without behavioral disturbance, psychotic disturbance, mood disturbance, and anxiety: Secondary | ICD-10-CM | POA: Diagnosis not present

## 2022-06-02 DIAGNOSIS — Z9181 History of falling: Secondary | ICD-10-CM | POA: Diagnosis not present

## 2022-06-02 DIAGNOSIS — R2689 Other abnormalities of gait and mobility: Secondary | ICD-10-CM | POA: Diagnosis not present

## 2022-06-02 DIAGNOSIS — I441 Atrioventricular block, second degree: Secondary | ICD-10-CM

## 2022-06-02 DIAGNOSIS — R279 Unspecified lack of coordination: Secondary | ICD-10-CM | POA: Diagnosis not present

## 2022-06-02 DIAGNOSIS — M25532 Pain in left wrist: Secondary | ICD-10-CM | POA: Diagnosis not present

## 2022-06-02 DIAGNOSIS — M6281 Muscle weakness (generalized): Secondary | ICD-10-CM | POA: Diagnosis not present

## 2022-06-02 LAB — CUP PACEART REMOTE DEVICE CHECK
Battery Remaining Longevity: 56 mo
Battery Remaining Percentage: 49 %
Battery Voltage: 3.02 V
Brady Statistic AP VP Percent: 1 %
Brady Statistic AP VS Percent: 94 %
Brady Statistic AS VP Percent: 1 %
Brady Statistic AS VS Percent: 5.6 %
Brady Statistic RA Percent Paced: 93 %
Brady Statistic RV Percent Paced: 1 %
Date Time Interrogation Session: 20230829032543
Implantable Lead Implant Date: 20180524
Implantable Lead Implant Date: 20180524
Implantable Lead Location: 753859
Implantable Lead Location: 753860
Implantable Lead Model: 3830
Implantable Pulse Generator Implant Date: 20180524
Lead Channel Impedance Value: 490 Ohm
Lead Channel Impedance Value: 510 Ohm
Lead Channel Pacing Threshold Amplitude: 0.75 V
Lead Channel Pacing Threshold Amplitude: 0.75 V
Lead Channel Pacing Threshold Pulse Width: 0.5 ms
Lead Channel Pacing Threshold Pulse Width: 0.5 ms
Lead Channel Sensing Intrinsic Amplitude: 1.9 mV
Lead Channel Sensing Intrinsic Amplitude: 10.1 mV
Lead Channel Setting Pacing Amplitude: 2 V
Lead Channel Setting Pacing Amplitude: 2.5 V
Lead Channel Setting Pacing Pulse Width: 0.5 ms
Lead Channel Setting Sensing Sensitivity: 2 mV
Pulse Gen Model: 2272
Pulse Gen Serial Number: 8911338

## 2022-06-03 DIAGNOSIS — F02A0 Dementia in other diseases classified elsewhere, mild, without behavioral disturbance, psychotic disturbance, mood disturbance, and anxiety: Secondary | ICD-10-CM | POA: Diagnosis not present

## 2022-06-03 DIAGNOSIS — M6281 Muscle weakness (generalized): Secondary | ICD-10-CM | POA: Diagnosis not present

## 2022-06-03 DIAGNOSIS — R279 Unspecified lack of coordination: Secondary | ICD-10-CM | POA: Diagnosis not present

## 2022-06-03 DIAGNOSIS — M25532 Pain in left wrist: Secondary | ICD-10-CM | POA: Diagnosis not present

## 2022-06-03 DIAGNOSIS — R2689 Other abnormalities of gait and mobility: Secondary | ICD-10-CM | POA: Diagnosis not present

## 2022-06-03 DIAGNOSIS — Z9181 History of falling: Secondary | ICD-10-CM | POA: Diagnosis not present

## 2022-06-04 DIAGNOSIS — M25532 Pain in left wrist: Secondary | ICD-10-CM | POA: Diagnosis not present

## 2022-06-04 DIAGNOSIS — M6281 Muscle weakness (generalized): Secondary | ICD-10-CM | POA: Diagnosis not present

## 2022-06-04 DIAGNOSIS — F02A0 Dementia in other diseases classified elsewhere, mild, without behavioral disturbance, psychotic disturbance, mood disturbance, and anxiety: Secondary | ICD-10-CM | POA: Diagnosis not present

## 2022-06-04 DIAGNOSIS — R2689 Other abnormalities of gait and mobility: Secondary | ICD-10-CM | POA: Diagnosis not present

## 2022-06-04 DIAGNOSIS — Z9181 History of falling: Secondary | ICD-10-CM | POA: Diagnosis not present

## 2022-06-04 DIAGNOSIS — R279 Unspecified lack of coordination: Secondary | ICD-10-CM | POA: Diagnosis not present

## 2022-06-05 DIAGNOSIS — F0390 Unspecified dementia without behavioral disturbance: Secondary | ICD-10-CM | POA: Diagnosis not present

## 2022-06-05 DIAGNOSIS — R2681 Unsteadiness on feet: Secondary | ICD-10-CM | POA: Diagnosis not present

## 2022-06-05 DIAGNOSIS — R1312 Dysphagia, oropharyngeal phase: Secondary | ICD-10-CM | POA: Diagnosis not present

## 2022-06-05 DIAGNOSIS — M6281 Muscle weakness (generalized): Secondary | ICD-10-CM | POA: Diagnosis not present

## 2022-06-05 DIAGNOSIS — R262 Difficulty in walking, not elsewhere classified: Secondary | ICD-10-CM | POA: Diagnosis not present

## 2022-06-05 DIAGNOSIS — I480 Paroxysmal atrial fibrillation: Secondary | ICD-10-CM | POA: Diagnosis not present

## 2022-06-05 DIAGNOSIS — R279 Unspecified lack of coordination: Secondary | ICD-10-CM | POA: Diagnosis not present

## 2022-06-05 DIAGNOSIS — G301 Alzheimer's disease with late onset: Secondary | ICD-10-CM | POA: Diagnosis not present

## 2022-06-05 DIAGNOSIS — Z9181 History of falling: Secondary | ICD-10-CM | POA: Diagnosis not present

## 2022-06-05 DIAGNOSIS — I10 Essential (primary) hypertension: Secondary | ICD-10-CM | POA: Diagnosis not present

## 2022-06-05 DIAGNOSIS — R2689 Other abnormalities of gait and mobility: Secondary | ICD-10-CM | POA: Diagnosis not present

## 2022-06-05 DIAGNOSIS — M25532 Pain in left wrist: Secondary | ICD-10-CM | POA: Diagnosis not present

## 2022-06-05 DIAGNOSIS — R41841 Cognitive communication deficit: Secondary | ICD-10-CM | POA: Diagnosis not present

## 2022-06-05 DIAGNOSIS — F0280 Dementia in other diseases classified elsewhere without behavioral disturbance: Secondary | ICD-10-CM | POA: Diagnosis not present

## 2022-06-05 DIAGNOSIS — E039 Hypothyroidism, unspecified: Secondary | ICD-10-CM | POA: Diagnosis not present

## 2022-06-08 DIAGNOSIS — F0280 Dementia in other diseases classified elsewhere without behavioral disturbance: Secondary | ICD-10-CM | POA: Diagnosis not present

## 2022-06-08 DIAGNOSIS — M25532 Pain in left wrist: Secondary | ICD-10-CM | POA: Diagnosis not present

## 2022-06-08 DIAGNOSIS — Z9181 History of falling: Secondary | ICD-10-CM | POA: Diagnosis not present

## 2022-06-08 DIAGNOSIS — M6281 Muscle weakness (generalized): Secondary | ICD-10-CM | POA: Diagnosis not present

## 2022-06-08 DIAGNOSIS — R279 Unspecified lack of coordination: Secondary | ICD-10-CM | POA: Diagnosis not present

## 2022-06-08 DIAGNOSIS — F0390 Unspecified dementia without behavioral disturbance: Secondary | ICD-10-CM | POA: Diagnosis not present

## 2022-06-09 DIAGNOSIS — F0280 Dementia in other diseases classified elsewhere without behavioral disturbance: Secondary | ICD-10-CM | POA: Diagnosis not present

## 2022-06-09 DIAGNOSIS — M6281 Muscle weakness (generalized): Secondary | ICD-10-CM | POA: Diagnosis not present

## 2022-06-09 DIAGNOSIS — M25532 Pain in left wrist: Secondary | ICD-10-CM | POA: Diagnosis not present

## 2022-06-09 DIAGNOSIS — R279 Unspecified lack of coordination: Secondary | ICD-10-CM | POA: Diagnosis not present

## 2022-06-09 DIAGNOSIS — F0390 Unspecified dementia without behavioral disturbance: Secondary | ICD-10-CM | POA: Diagnosis not present

## 2022-06-09 DIAGNOSIS — Z9181 History of falling: Secondary | ICD-10-CM | POA: Diagnosis not present

## 2022-06-10 DIAGNOSIS — F0390 Unspecified dementia without behavioral disturbance: Secondary | ICD-10-CM | POA: Diagnosis not present

## 2022-06-10 DIAGNOSIS — M6281 Muscle weakness (generalized): Secondary | ICD-10-CM | POA: Diagnosis not present

## 2022-06-10 DIAGNOSIS — Z9181 History of falling: Secondary | ICD-10-CM | POA: Diagnosis not present

## 2022-06-10 DIAGNOSIS — M25532 Pain in left wrist: Secondary | ICD-10-CM | POA: Diagnosis not present

## 2022-06-10 DIAGNOSIS — R279 Unspecified lack of coordination: Secondary | ICD-10-CM | POA: Diagnosis not present

## 2022-06-10 DIAGNOSIS — F0280 Dementia in other diseases classified elsewhere without behavioral disturbance: Secondary | ICD-10-CM | POA: Diagnosis not present

## 2022-06-11 DIAGNOSIS — R279 Unspecified lack of coordination: Secondary | ICD-10-CM | POA: Diagnosis not present

## 2022-06-11 DIAGNOSIS — Z9181 History of falling: Secondary | ICD-10-CM | POA: Diagnosis not present

## 2022-06-11 DIAGNOSIS — M6281 Muscle weakness (generalized): Secondary | ICD-10-CM | POA: Diagnosis not present

## 2022-06-11 DIAGNOSIS — F0390 Unspecified dementia without behavioral disturbance: Secondary | ICD-10-CM | POA: Diagnosis not present

## 2022-06-11 DIAGNOSIS — F0280 Dementia in other diseases classified elsewhere without behavioral disturbance: Secondary | ICD-10-CM | POA: Diagnosis not present

## 2022-06-11 DIAGNOSIS — M25532 Pain in left wrist: Secondary | ICD-10-CM | POA: Diagnosis not present

## 2022-06-12 DIAGNOSIS — R279 Unspecified lack of coordination: Secondary | ICD-10-CM | POA: Diagnosis not present

## 2022-06-12 DIAGNOSIS — M25532 Pain in left wrist: Secondary | ICD-10-CM | POA: Diagnosis not present

## 2022-06-12 DIAGNOSIS — Z9181 History of falling: Secondary | ICD-10-CM | POA: Diagnosis not present

## 2022-06-12 DIAGNOSIS — M6281 Muscle weakness (generalized): Secondary | ICD-10-CM | POA: Diagnosis not present

## 2022-06-12 DIAGNOSIS — F0280 Dementia in other diseases classified elsewhere without behavioral disturbance: Secondary | ICD-10-CM | POA: Diagnosis not present

## 2022-06-12 DIAGNOSIS — F0390 Unspecified dementia without behavioral disturbance: Secondary | ICD-10-CM | POA: Diagnosis not present

## 2022-06-15 DIAGNOSIS — M6281 Muscle weakness (generalized): Secondary | ICD-10-CM | POA: Diagnosis not present

## 2022-06-15 DIAGNOSIS — F0280 Dementia in other diseases classified elsewhere without behavioral disturbance: Secondary | ICD-10-CM | POA: Diagnosis not present

## 2022-06-15 DIAGNOSIS — F0390 Unspecified dementia without behavioral disturbance: Secondary | ICD-10-CM | POA: Diagnosis not present

## 2022-06-15 DIAGNOSIS — R82998 Other abnormal findings in urine: Secondary | ICD-10-CM | POA: Diagnosis not present

## 2022-06-15 DIAGNOSIS — R279 Unspecified lack of coordination: Secondary | ICD-10-CM | POA: Diagnosis not present

## 2022-06-15 DIAGNOSIS — M25532 Pain in left wrist: Secondary | ICD-10-CM | POA: Diagnosis not present

## 2022-06-15 DIAGNOSIS — Z9181 History of falling: Secondary | ICD-10-CM | POA: Diagnosis not present

## 2022-06-15 DIAGNOSIS — R410 Disorientation, unspecified: Secondary | ICD-10-CM | POA: Diagnosis not present

## 2022-06-16 DIAGNOSIS — F0390 Unspecified dementia without behavioral disturbance: Secondary | ICD-10-CM | POA: Diagnosis not present

## 2022-06-16 DIAGNOSIS — R279 Unspecified lack of coordination: Secondary | ICD-10-CM | POA: Diagnosis not present

## 2022-06-16 DIAGNOSIS — Z9181 History of falling: Secondary | ICD-10-CM | POA: Diagnosis not present

## 2022-06-16 DIAGNOSIS — M25532 Pain in left wrist: Secondary | ICD-10-CM | POA: Diagnosis not present

## 2022-06-16 DIAGNOSIS — F0280 Dementia in other diseases classified elsewhere without behavioral disturbance: Secondary | ICD-10-CM | POA: Diagnosis not present

## 2022-06-16 DIAGNOSIS — M6281 Muscle weakness (generalized): Secondary | ICD-10-CM | POA: Diagnosis not present

## 2022-06-18 DIAGNOSIS — M6281 Muscle weakness (generalized): Secondary | ICD-10-CM | POA: Diagnosis not present

## 2022-06-18 DIAGNOSIS — F0390 Unspecified dementia without behavioral disturbance: Secondary | ICD-10-CM | POA: Diagnosis not present

## 2022-06-18 DIAGNOSIS — F0280 Dementia in other diseases classified elsewhere without behavioral disturbance: Secondary | ICD-10-CM | POA: Diagnosis not present

## 2022-06-18 DIAGNOSIS — Z9181 History of falling: Secondary | ICD-10-CM | POA: Diagnosis not present

## 2022-06-18 DIAGNOSIS — R279 Unspecified lack of coordination: Secondary | ICD-10-CM | POA: Diagnosis not present

## 2022-06-18 DIAGNOSIS — M25532 Pain in left wrist: Secondary | ICD-10-CM | POA: Diagnosis not present

## 2022-06-19 DIAGNOSIS — F0280 Dementia in other diseases classified elsewhere without behavioral disturbance: Secondary | ICD-10-CM | POA: Diagnosis not present

## 2022-06-19 DIAGNOSIS — M25532 Pain in left wrist: Secondary | ICD-10-CM | POA: Diagnosis not present

## 2022-06-19 DIAGNOSIS — Z9181 History of falling: Secondary | ICD-10-CM | POA: Diagnosis not present

## 2022-06-19 DIAGNOSIS — M6281 Muscle weakness (generalized): Secondary | ICD-10-CM | POA: Diagnosis not present

## 2022-06-19 DIAGNOSIS — R279 Unspecified lack of coordination: Secondary | ICD-10-CM | POA: Diagnosis not present

## 2022-06-19 DIAGNOSIS — F0390 Unspecified dementia without behavioral disturbance: Secondary | ICD-10-CM | POA: Diagnosis not present

## 2022-06-22 DIAGNOSIS — M25532 Pain in left wrist: Secondary | ICD-10-CM | POA: Diagnosis not present

## 2022-06-22 DIAGNOSIS — F0390 Unspecified dementia without behavioral disturbance: Secondary | ICD-10-CM | POA: Diagnosis not present

## 2022-06-22 DIAGNOSIS — Z9181 History of falling: Secondary | ICD-10-CM | POA: Diagnosis not present

## 2022-06-22 DIAGNOSIS — M6281 Muscle weakness (generalized): Secondary | ICD-10-CM | POA: Diagnosis not present

## 2022-06-22 DIAGNOSIS — R279 Unspecified lack of coordination: Secondary | ICD-10-CM | POA: Diagnosis not present

## 2022-06-22 DIAGNOSIS — F0280 Dementia in other diseases classified elsewhere without behavioral disturbance: Secondary | ICD-10-CM | POA: Diagnosis not present

## 2022-06-23 DIAGNOSIS — M6281 Muscle weakness (generalized): Secondary | ICD-10-CM | POA: Diagnosis not present

## 2022-06-23 DIAGNOSIS — R279 Unspecified lack of coordination: Secondary | ICD-10-CM | POA: Diagnosis not present

## 2022-06-23 DIAGNOSIS — F0280 Dementia in other diseases classified elsewhere without behavioral disturbance: Secondary | ICD-10-CM | POA: Diagnosis not present

## 2022-06-23 DIAGNOSIS — F0390 Unspecified dementia without behavioral disturbance: Secondary | ICD-10-CM | POA: Diagnosis not present

## 2022-06-23 DIAGNOSIS — M25532 Pain in left wrist: Secondary | ICD-10-CM | POA: Diagnosis not present

## 2022-06-23 DIAGNOSIS — Z9181 History of falling: Secondary | ICD-10-CM | POA: Diagnosis not present

## 2022-06-24 DIAGNOSIS — M25532 Pain in left wrist: Secondary | ICD-10-CM | POA: Diagnosis not present

## 2022-06-24 DIAGNOSIS — Z9181 History of falling: Secondary | ICD-10-CM | POA: Diagnosis not present

## 2022-06-24 DIAGNOSIS — R279 Unspecified lack of coordination: Secondary | ICD-10-CM | POA: Diagnosis not present

## 2022-06-24 DIAGNOSIS — M6281 Muscle weakness (generalized): Secondary | ICD-10-CM | POA: Diagnosis not present

## 2022-06-24 DIAGNOSIS — F0280 Dementia in other diseases classified elsewhere without behavioral disturbance: Secondary | ICD-10-CM | POA: Diagnosis not present

## 2022-06-24 DIAGNOSIS — F0390 Unspecified dementia without behavioral disturbance: Secondary | ICD-10-CM | POA: Diagnosis not present

## 2022-06-26 DIAGNOSIS — M6281 Muscle weakness (generalized): Secondary | ICD-10-CM | POA: Diagnosis not present

## 2022-06-26 DIAGNOSIS — Z9181 History of falling: Secondary | ICD-10-CM | POA: Diagnosis not present

## 2022-06-26 DIAGNOSIS — M25532 Pain in left wrist: Secondary | ICD-10-CM | POA: Diagnosis not present

## 2022-06-26 DIAGNOSIS — R279 Unspecified lack of coordination: Secondary | ICD-10-CM | POA: Diagnosis not present

## 2022-06-26 DIAGNOSIS — F0390 Unspecified dementia without behavioral disturbance: Secondary | ICD-10-CM | POA: Diagnosis not present

## 2022-06-26 DIAGNOSIS — F0280 Dementia in other diseases classified elsewhere without behavioral disturbance: Secondary | ICD-10-CM | POA: Diagnosis not present

## 2022-06-26 NOTE — Progress Notes (Signed)
Remote pacemaker transmission.   

## 2022-06-29 DIAGNOSIS — M25532 Pain in left wrist: Secondary | ICD-10-CM | POA: Diagnosis not present

## 2022-06-29 DIAGNOSIS — F0390 Unspecified dementia without behavioral disturbance: Secondary | ICD-10-CM | POA: Diagnosis not present

## 2022-06-29 DIAGNOSIS — M6281 Muscle weakness (generalized): Secondary | ICD-10-CM | POA: Diagnosis not present

## 2022-06-29 DIAGNOSIS — Z9181 History of falling: Secondary | ICD-10-CM | POA: Diagnosis not present

## 2022-06-29 DIAGNOSIS — R279 Unspecified lack of coordination: Secondary | ICD-10-CM | POA: Diagnosis not present

## 2022-06-29 DIAGNOSIS — F0280 Dementia in other diseases classified elsewhere without behavioral disturbance: Secondary | ICD-10-CM | POA: Diagnosis not present

## 2022-06-30 DIAGNOSIS — M25532 Pain in left wrist: Secondary | ICD-10-CM | POA: Diagnosis not present

## 2022-06-30 DIAGNOSIS — R279 Unspecified lack of coordination: Secondary | ICD-10-CM | POA: Diagnosis not present

## 2022-06-30 DIAGNOSIS — Z9181 History of falling: Secondary | ICD-10-CM | POA: Diagnosis not present

## 2022-06-30 DIAGNOSIS — F0390 Unspecified dementia without behavioral disturbance: Secondary | ICD-10-CM | POA: Diagnosis not present

## 2022-06-30 DIAGNOSIS — F0280 Dementia in other diseases classified elsewhere without behavioral disturbance: Secondary | ICD-10-CM | POA: Diagnosis not present

## 2022-06-30 DIAGNOSIS — M6281 Muscle weakness (generalized): Secondary | ICD-10-CM | POA: Diagnosis not present

## 2022-07-01 DIAGNOSIS — Z9181 History of falling: Secondary | ICD-10-CM | POA: Diagnosis not present

## 2022-07-01 DIAGNOSIS — F0280 Dementia in other diseases classified elsewhere without behavioral disturbance: Secondary | ICD-10-CM | POA: Diagnosis not present

## 2022-07-01 DIAGNOSIS — F0390 Unspecified dementia without behavioral disturbance: Secondary | ICD-10-CM | POA: Diagnosis not present

## 2022-07-01 DIAGNOSIS — M6281 Muscle weakness (generalized): Secondary | ICD-10-CM | POA: Diagnosis not present

## 2022-07-01 DIAGNOSIS — M25532 Pain in left wrist: Secondary | ICD-10-CM | POA: Diagnosis not present

## 2022-07-01 DIAGNOSIS — R279 Unspecified lack of coordination: Secondary | ICD-10-CM | POA: Diagnosis not present

## 2022-07-02 DIAGNOSIS — F0280 Dementia in other diseases classified elsewhere without behavioral disturbance: Secondary | ICD-10-CM | POA: Diagnosis not present

## 2022-07-02 DIAGNOSIS — Z9181 History of falling: Secondary | ICD-10-CM | POA: Diagnosis not present

## 2022-07-02 DIAGNOSIS — F0390 Unspecified dementia without behavioral disturbance: Secondary | ICD-10-CM | POA: Diagnosis not present

## 2022-07-02 DIAGNOSIS — R279 Unspecified lack of coordination: Secondary | ICD-10-CM | POA: Diagnosis not present

## 2022-07-02 DIAGNOSIS — M6281 Muscle weakness (generalized): Secondary | ICD-10-CM | POA: Diagnosis not present

## 2022-07-02 DIAGNOSIS — M25532 Pain in left wrist: Secondary | ICD-10-CM | POA: Diagnosis not present

## 2022-07-06 DIAGNOSIS — F0390 Unspecified dementia without behavioral disturbance: Secondary | ICD-10-CM | POA: Diagnosis not present

## 2022-07-06 DIAGNOSIS — M25532 Pain in left wrist: Secondary | ICD-10-CM | POA: Diagnosis not present

## 2022-07-06 DIAGNOSIS — R2681 Unsteadiness on feet: Secondary | ICD-10-CM | POA: Diagnosis not present

## 2022-07-06 DIAGNOSIS — Z9181 History of falling: Secondary | ICD-10-CM | POA: Diagnosis not present

## 2022-07-06 DIAGNOSIS — R41841 Cognitive communication deficit: Secondary | ICD-10-CM | POA: Diagnosis not present

## 2022-07-06 DIAGNOSIS — R2689 Other abnormalities of gait and mobility: Secondary | ICD-10-CM | POA: Diagnosis not present

## 2022-07-06 DIAGNOSIS — F02A0 Dementia in other diseases classified elsewhere, mild, without behavioral disturbance, psychotic disturbance, mood disturbance, and anxiety: Secondary | ICD-10-CM | POA: Diagnosis not present

## 2022-07-06 DIAGNOSIS — R279 Unspecified lack of coordination: Secondary | ICD-10-CM | POA: Diagnosis not present

## 2022-07-06 DIAGNOSIS — R1312 Dysphagia, oropharyngeal phase: Secondary | ICD-10-CM | POA: Diagnosis not present

## 2022-07-06 DIAGNOSIS — M6281 Muscle weakness (generalized): Secondary | ICD-10-CM | POA: Diagnosis not present

## 2022-07-07 DIAGNOSIS — F0390 Unspecified dementia without behavioral disturbance: Secondary | ICD-10-CM | POA: Diagnosis not present

## 2022-07-07 DIAGNOSIS — Z9181 History of falling: Secondary | ICD-10-CM | POA: Diagnosis not present

## 2022-07-07 DIAGNOSIS — M6281 Muscle weakness (generalized): Secondary | ICD-10-CM | POA: Diagnosis not present

## 2022-07-07 DIAGNOSIS — M25532 Pain in left wrist: Secondary | ICD-10-CM | POA: Diagnosis not present

## 2022-07-07 DIAGNOSIS — R2689 Other abnormalities of gait and mobility: Secondary | ICD-10-CM | POA: Diagnosis not present

## 2022-07-07 DIAGNOSIS — R279 Unspecified lack of coordination: Secondary | ICD-10-CM | POA: Diagnosis not present

## 2022-07-08 DIAGNOSIS — R2689 Other abnormalities of gait and mobility: Secondary | ICD-10-CM | POA: Diagnosis not present

## 2022-07-08 DIAGNOSIS — R279 Unspecified lack of coordination: Secondary | ICD-10-CM | POA: Diagnosis not present

## 2022-07-08 DIAGNOSIS — Z9181 History of falling: Secondary | ICD-10-CM | POA: Diagnosis not present

## 2022-07-08 DIAGNOSIS — M25532 Pain in left wrist: Secondary | ICD-10-CM | POA: Diagnosis not present

## 2022-07-08 DIAGNOSIS — F0390 Unspecified dementia without behavioral disturbance: Secondary | ICD-10-CM | POA: Diagnosis not present

## 2022-07-08 DIAGNOSIS — M6281 Muscle weakness (generalized): Secondary | ICD-10-CM | POA: Diagnosis not present

## 2022-07-09 DIAGNOSIS — M25532 Pain in left wrist: Secondary | ICD-10-CM | POA: Diagnosis not present

## 2022-07-09 DIAGNOSIS — R279 Unspecified lack of coordination: Secondary | ICD-10-CM | POA: Diagnosis not present

## 2022-07-09 DIAGNOSIS — Z9181 History of falling: Secondary | ICD-10-CM | POA: Diagnosis not present

## 2022-07-09 DIAGNOSIS — M6281 Muscle weakness (generalized): Secondary | ICD-10-CM | POA: Diagnosis not present

## 2022-07-09 DIAGNOSIS — R2689 Other abnormalities of gait and mobility: Secondary | ICD-10-CM | POA: Diagnosis not present

## 2022-07-09 DIAGNOSIS — F0390 Unspecified dementia without behavioral disturbance: Secondary | ICD-10-CM | POA: Diagnosis not present

## 2022-07-13 DIAGNOSIS — F0390 Unspecified dementia without behavioral disturbance: Secondary | ICD-10-CM | POA: Diagnosis not present

## 2022-07-13 DIAGNOSIS — R279 Unspecified lack of coordination: Secondary | ICD-10-CM | POA: Diagnosis not present

## 2022-07-13 DIAGNOSIS — Z9181 History of falling: Secondary | ICD-10-CM | POA: Diagnosis not present

## 2022-07-13 DIAGNOSIS — R2689 Other abnormalities of gait and mobility: Secondary | ICD-10-CM | POA: Diagnosis not present

## 2022-07-13 DIAGNOSIS — M25532 Pain in left wrist: Secondary | ICD-10-CM | POA: Diagnosis not present

## 2022-07-13 DIAGNOSIS — M6281 Muscle weakness (generalized): Secondary | ICD-10-CM | POA: Diagnosis not present

## 2022-07-14 DIAGNOSIS — F0390 Unspecified dementia without behavioral disturbance: Secondary | ICD-10-CM | POA: Diagnosis not present

## 2022-07-14 DIAGNOSIS — M25532 Pain in left wrist: Secondary | ICD-10-CM | POA: Diagnosis not present

## 2022-07-14 DIAGNOSIS — R279 Unspecified lack of coordination: Secondary | ICD-10-CM | POA: Diagnosis not present

## 2022-07-14 DIAGNOSIS — Z9181 History of falling: Secondary | ICD-10-CM | POA: Diagnosis not present

## 2022-07-14 DIAGNOSIS — M6281 Muscle weakness (generalized): Secondary | ICD-10-CM | POA: Diagnosis not present

## 2022-07-14 DIAGNOSIS — R2689 Other abnormalities of gait and mobility: Secondary | ICD-10-CM | POA: Diagnosis not present

## 2022-07-15 DIAGNOSIS — M25532 Pain in left wrist: Secondary | ICD-10-CM | POA: Diagnosis not present

## 2022-07-15 DIAGNOSIS — R2689 Other abnormalities of gait and mobility: Secondary | ICD-10-CM | POA: Diagnosis not present

## 2022-07-15 DIAGNOSIS — M6281 Muscle weakness (generalized): Secondary | ICD-10-CM | POA: Diagnosis not present

## 2022-07-15 DIAGNOSIS — Z9181 History of falling: Secondary | ICD-10-CM | POA: Diagnosis not present

## 2022-07-15 DIAGNOSIS — F0390 Unspecified dementia without behavioral disturbance: Secondary | ICD-10-CM | POA: Diagnosis not present

## 2022-07-15 DIAGNOSIS — R279 Unspecified lack of coordination: Secondary | ICD-10-CM | POA: Diagnosis not present

## 2022-07-16 ENCOUNTER — Telehealth: Payer: Self-pay | Admitting: Internal Medicine

## 2022-07-16 DIAGNOSIS — E039 Hypothyroidism, unspecified: Secondary | ICD-10-CM | POA: Diagnosis not present

## 2022-07-16 DIAGNOSIS — M25532 Pain in left wrist: Secondary | ICD-10-CM | POA: Diagnosis not present

## 2022-07-16 DIAGNOSIS — I4891 Unspecified atrial fibrillation: Secondary | ICD-10-CM | POA: Diagnosis not present

## 2022-07-16 DIAGNOSIS — F0390 Unspecified dementia without behavioral disturbance: Secondary | ICD-10-CM | POA: Diagnosis not present

## 2022-07-16 DIAGNOSIS — R2689 Other abnormalities of gait and mobility: Secondary | ICD-10-CM | POA: Diagnosis not present

## 2022-07-16 DIAGNOSIS — Z9181 History of falling: Secondary | ICD-10-CM | POA: Diagnosis not present

## 2022-07-16 DIAGNOSIS — I25119 Atherosclerotic heart disease of native coronary artery with unspecified angina pectoris: Secondary | ICD-10-CM | POA: Diagnosis not present

## 2022-07-16 DIAGNOSIS — G301 Alzheimer's disease with late onset: Secondary | ICD-10-CM | POA: Diagnosis not present

## 2022-07-16 DIAGNOSIS — R279 Unspecified lack of coordination: Secondary | ICD-10-CM | POA: Diagnosis not present

## 2022-07-16 DIAGNOSIS — M6281 Muscle weakness (generalized): Secondary | ICD-10-CM | POA: Diagnosis not present

## 2022-07-16 DIAGNOSIS — F02A0 Dementia in other diseases classified elsewhere, mild, without behavioral disturbance, psychotic disturbance, mood disturbance, and anxiety: Secondary | ICD-10-CM | POA: Diagnosis not present

## 2022-07-16 NOTE — Telephone Encounter (Signed)
Daughter-in-law called stating the patient lives in an [facility] facility and the nurse that can do the manual transmission is out today.  Daughter-in-law stated the reading will be sent tomorrow.

## 2022-07-17 DIAGNOSIS — R279 Unspecified lack of coordination: Secondary | ICD-10-CM | POA: Diagnosis not present

## 2022-07-17 DIAGNOSIS — Z9181 History of falling: Secondary | ICD-10-CM | POA: Diagnosis not present

## 2022-07-17 DIAGNOSIS — F0390 Unspecified dementia without behavioral disturbance: Secondary | ICD-10-CM | POA: Diagnosis not present

## 2022-07-17 DIAGNOSIS — M25532 Pain in left wrist: Secondary | ICD-10-CM | POA: Diagnosis not present

## 2022-07-17 DIAGNOSIS — M6281 Muscle weakness (generalized): Secondary | ICD-10-CM | POA: Diagnosis not present

## 2022-07-17 DIAGNOSIS — R2689 Other abnormalities of gait and mobility: Secondary | ICD-10-CM | POA: Diagnosis not present

## 2022-07-20 DIAGNOSIS — Z9181 History of falling: Secondary | ICD-10-CM | POA: Diagnosis not present

## 2022-07-20 DIAGNOSIS — R2689 Other abnormalities of gait and mobility: Secondary | ICD-10-CM | POA: Diagnosis not present

## 2022-07-20 DIAGNOSIS — F0390 Unspecified dementia without behavioral disturbance: Secondary | ICD-10-CM | POA: Diagnosis not present

## 2022-07-20 DIAGNOSIS — R279 Unspecified lack of coordination: Secondary | ICD-10-CM | POA: Diagnosis not present

## 2022-07-20 DIAGNOSIS — M25532 Pain in left wrist: Secondary | ICD-10-CM | POA: Diagnosis not present

## 2022-07-20 DIAGNOSIS — M6281 Muscle weakness (generalized): Secondary | ICD-10-CM | POA: Diagnosis not present

## 2022-07-20 NOTE — Telephone Encounter (Signed)
Transmission not received as of 07/20/2022.

## 2022-07-21 DIAGNOSIS — F0390 Unspecified dementia without behavioral disturbance: Secondary | ICD-10-CM | POA: Diagnosis not present

## 2022-07-21 DIAGNOSIS — R2689 Other abnormalities of gait and mobility: Secondary | ICD-10-CM | POA: Diagnosis not present

## 2022-07-21 DIAGNOSIS — M25532 Pain in left wrist: Secondary | ICD-10-CM | POA: Diagnosis not present

## 2022-07-21 DIAGNOSIS — R279 Unspecified lack of coordination: Secondary | ICD-10-CM | POA: Diagnosis not present

## 2022-07-21 DIAGNOSIS — Z9181 History of falling: Secondary | ICD-10-CM | POA: Diagnosis not present

## 2022-07-21 DIAGNOSIS — M6281 Muscle weakness (generalized): Secondary | ICD-10-CM | POA: Diagnosis not present

## 2022-07-22 DIAGNOSIS — F0390 Unspecified dementia without behavioral disturbance: Secondary | ICD-10-CM | POA: Diagnosis not present

## 2022-07-22 DIAGNOSIS — R279 Unspecified lack of coordination: Secondary | ICD-10-CM | POA: Diagnosis not present

## 2022-07-22 DIAGNOSIS — R413 Other amnesia: Secondary | ICD-10-CM | POA: Diagnosis not present

## 2022-07-22 DIAGNOSIS — R262 Difficulty in walking, not elsewhere classified: Secondary | ICD-10-CM | POA: Diagnosis not present

## 2022-07-22 DIAGNOSIS — R2689 Other abnormalities of gait and mobility: Secondary | ICD-10-CM | POA: Diagnosis not present

## 2022-07-22 DIAGNOSIS — M25532 Pain in left wrist: Secondary | ICD-10-CM | POA: Diagnosis not present

## 2022-07-22 DIAGNOSIS — G479 Sleep disorder, unspecified: Secondary | ICD-10-CM | POA: Diagnosis not present

## 2022-07-22 DIAGNOSIS — Z9181 History of falling: Secondary | ICD-10-CM | POA: Diagnosis not present

## 2022-07-22 DIAGNOSIS — M6281 Muscle weakness (generalized): Secondary | ICD-10-CM | POA: Diagnosis not present

## 2022-07-22 DIAGNOSIS — G301 Alzheimer's disease with late onset: Secondary | ICD-10-CM | POA: Diagnosis not present

## 2022-07-22 DIAGNOSIS — F02A0 Dementia in other diseases classified elsewhere, mild, without behavioral disturbance, psychotic disturbance, mood disturbance, and anxiety: Secondary | ICD-10-CM | POA: Diagnosis not present

## 2022-07-22 DIAGNOSIS — R443 Hallucinations, unspecified: Secondary | ICD-10-CM | POA: Diagnosis not present

## 2022-07-22 DIAGNOSIS — F03A0 Unspecified dementia, mild, without behavioral disturbance, psychotic disturbance, mood disturbance, and anxiety: Secondary | ICD-10-CM | POA: Diagnosis not present

## 2022-08-06 DIAGNOSIS — Z9181 History of falling: Secondary | ICD-10-CM | POA: Diagnosis not present

## 2022-08-06 DIAGNOSIS — R2689 Other abnormalities of gait and mobility: Secondary | ICD-10-CM | POA: Diagnosis not present

## 2022-08-06 DIAGNOSIS — F02A0 Dementia in other diseases classified elsewhere, mild, without behavioral disturbance, psychotic disturbance, mood disturbance, and anxiety: Secondary | ICD-10-CM | POA: Diagnosis not present

## 2022-08-06 DIAGNOSIS — R279 Unspecified lack of coordination: Secondary | ICD-10-CM | POA: Diagnosis not present

## 2022-08-06 DIAGNOSIS — M6281 Muscle weakness (generalized): Secondary | ICD-10-CM | POA: Diagnosis not present

## 2022-09-01 ENCOUNTER — Ambulatory Visit (INDEPENDENT_AMBULATORY_CARE_PROVIDER_SITE_OTHER): Payer: Medicare Other

## 2022-09-01 DIAGNOSIS — I441 Atrioventricular block, second degree: Secondary | ICD-10-CM

## 2022-09-01 DIAGNOSIS — Z950 Presence of cardiac pacemaker: Secondary | ICD-10-CM | POA: Diagnosis not present

## 2022-09-01 LAB — CUP PACEART REMOTE DEVICE CHECK
Battery Remaining Longevity: 53 mo
Battery Remaining Percentage: 47 %
Battery Voltage: 3.02 V
Brady Statistic AP VP Percent: 1 %
Brady Statistic AP VS Percent: 95 %
Brady Statistic AS VP Percent: 1 %
Brady Statistic AS VS Percent: 4.6 %
Brady Statistic RA Percent Paced: 95 %
Brady Statistic RV Percent Paced: 1 %
Date Time Interrogation Session: 20231128020014
Implantable Lead Connection Status: 753985
Implantable Lead Connection Status: 753985
Implantable Lead Implant Date: 20180524
Implantable Lead Implant Date: 20180524
Implantable Lead Location: 753859
Implantable Lead Location: 753860
Implantable Lead Model: 3830
Implantable Pulse Generator Implant Date: 20180524
Lead Channel Impedance Value: 490 Ohm
Lead Channel Impedance Value: 490 Ohm
Lead Channel Pacing Threshold Amplitude: 0.75 V
Lead Channel Pacing Threshold Amplitude: 0.75 V
Lead Channel Pacing Threshold Pulse Width: 0.5 ms
Lead Channel Pacing Threshold Pulse Width: 0.5 ms
Lead Channel Sensing Intrinsic Amplitude: 2.4 mV
Lead Channel Sensing Intrinsic Amplitude: 9.6 mV
Lead Channel Setting Pacing Amplitude: 2 V
Lead Channel Setting Pacing Amplitude: 2.5 V
Lead Channel Setting Pacing Pulse Width: 0.5 ms
Lead Channel Setting Sensing Sensitivity: 2 mV
Pulse Gen Model: 2272
Pulse Gen Serial Number: 8911338

## 2022-09-30 NOTE — Progress Notes (Signed)
Remote pacemaker transmission.   

## 2022-12-01 ENCOUNTER — Ambulatory Visit: Payer: Medicare Other

## 2022-12-01 DIAGNOSIS — I441 Atrioventricular block, second degree: Secondary | ICD-10-CM | POA: Diagnosis not present

## 2022-12-01 LAB — CUP PACEART REMOTE DEVICE CHECK
Battery Remaining Longevity: 50 mo
Battery Remaining Percentage: 45 %
Battery Voltage: 3.01 V
Brady Statistic AP VP Percent: 1 %
Brady Statistic AP VS Percent: 96 %
Brady Statistic AS VP Percent: 1 %
Brady Statistic AS VS Percent: 4.3 %
Brady Statistic RA Percent Paced: 95 %
Brady Statistic RV Percent Paced: 1 %
Date Time Interrogation Session: 20240227020014
Implantable Lead Connection Status: 753985
Implantable Lead Connection Status: 753985
Implantable Lead Implant Date: 20180524
Implantable Lead Implant Date: 20180524
Implantable Lead Location: 753859
Implantable Lead Location: 753860
Implantable Lead Model: 3830
Implantable Pulse Generator Implant Date: 20180524
Lead Channel Impedance Value: 460 Ohm
Lead Channel Impedance Value: 460 Ohm
Lead Channel Pacing Threshold Amplitude: 0.75 V
Lead Channel Pacing Threshold Amplitude: 0.75 V
Lead Channel Pacing Threshold Pulse Width: 0.5 ms
Lead Channel Pacing Threshold Pulse Width: 0.5 ms
Lead Channel Sensing Intrinsic Amplitude: 1.9 mV
Lead Channel Sensing Intrinsic Amplitude: 8.7 mV
Lead Channel Setting Pacing Amplitude: 2 V
Lead Channel Setting Pacing Amplitude: 2.5 V
Lead Channel Setting Pacing Pulse Width: 0.5 ms
Lead Channel Setting Sensing Sensitivity: 2 mV
Pulse Gen Model: 2272
Pulse Gen Serial Number: 8911338

## 2023-01-06 NOTE — Progress Notes (Signed)
Remote pacemaker transmission.   

## 2023-03-02 ENCOUNTER — Ambulatory Visit (INDEPENDENT_AMBULATORY_CARE_PROVIDER_SITE_OTHER): Payer: Medicare Other

## 2023-03-02 DIAGNOSIS — I441 Atrioventricular block, second degree: Secondary | ICD-10-CM | POA: Diagnosis not present

## 2023-03-02 LAB — CUP PACEART REMOTE DEVICE CHECK
Battery Remaining Longevity: 48 mo
Battery Remaining Percentage: 42 %
Battery Voltage: 3.02 V
Brady Statistic AP VP Percent: 1 %
Brady Statistic AP VS Percent: 96 %
Brady Statistic AS VP Percent: 1 %
Brady Statistic AS VS Percent: 4 %
Brady Statistic RA Percent Paced: 95 %
Brady Statistic RV Percent Paced: 1 %
Date Time Interrogation Session: 20240528020017
Implantable Lead Connection Status: 753985
Implantable Lead Connection Status: 753985
Implantable Lead Implant Date: 20180524
Implantable Lead Implant Date: 20180524
Implantable Lead Location: 753859
Implantable Lead Location: 753860
Implantable Lead Model: 3830
Implantable Pulse Generator Implant Date: 20180524
Lead Channel Impedance Value: 460 Ohm
Lead Channel Impedance Value: 490 Ohm
Lead Channel Pacing Threshold Amplitude: 0.75 V
Lead Channel Pacing Threshold Amplitude: 0.75 V
Lead Channel Pacing Threshold Pulse Width: 0.5 ms
Lead Channel Pacing Threshold Pulse Width: 0.5 ms
Lead Channel Sensing Intrinsic Amplitude: 1.5 mV
Lead Channel Sensing Intrinsic Amplitude: 8.5 mV
Lead Channel Setting Pacing Amplitude: 2 V
Lead Channel Setting Pacing Amplitude: 2.5 V
Lead Channel Setting Pacing Pulse Width: 0.5 ms
Lead Channel Setting Sensing Sensitivity: 2 mV
Pulse Gen Model: 2272
Pulse Gen Serial Number: 8911338

## 2023-03-18 ENCOUNTER — Emergency Department: Payer: Medicare Other

## 2023-03-18 ENCOUNTER — Other Ambulatory Visit: Payer: Self-pay

## 2023-03-18 ENCOUNTER — Emergency Department
Admission: EM | Admit: 2023-03-18 | Discharge: 2023-03-18 | Disposition: A | Discharge status: Home / Self Care | Payer: Medicare Other | Attending: Emergency Medicine | Admitting: Emergency Medicine

## 2023-03-18 DIAGNOSIS — I2510 Atherosclerotic heart disease of native coronary artery without angina pectoris: Secondary | ICD-10-CM | POA: Insufficient documentation

## 2023-03-18 DIAGNOSIS — Z8673 Personal history of transient ischemic attack (TIA), and cerebral infarction without residual deficits: Secondary | ICD-10-CM | POA: Diagnosis not present

## 2023-03-18 DIAGNOSIS — R4182 Altered mental status, unspecified: Secondary | ICD-10-CM | POA: Insufficient documentation

## 2023-03-18 DIAGNOSIS — R531 Weakness: Secondary | ICD-10-CM | POA: Diagnosis not present

## 2023-03-18 DIAGNOSIS — R410 Disorientation, unspecified: Secondary | ICD-10-CM

## 2023-03-18 LAB — BASIC METABOLIC PANEL
Anion gap: 8 (ref 5–15)
BUN: 34 mg/dL — ABNORMAL HIGH (ref 8–23)
CO2: 22 mmol/L (ref 22–32)
Calcium: 8.7 mg/dL — ABNORMAL LOW (ref 8.9–10.3)
Chloride: 106 mmol/L (ref 98–111)
Creatinine, Ser: 1.18 mg/dL — ABNORMAL HIGH (ref 0.44–1.00)
GFR, Estimated: 43 mL/min — ABNORMAL LOW (ref >60)
Glucose, Bld: 95 mg/dL (ref 70–99)
Potassium: 5.3 mmol/L — ABNORMAL HIGH (ref 3.5–5.1)
Sodium: 136 mmol/L (ref 135–145)

## 2023-03-18 LAB — URINALYSIS, COMPLETE (UACMP) WITH MICROSCOPIC
Bilirubin Urine: NEGATIVE
Glucose, UA: NEGATIVE mg/dL
Ketones, ur: NEGATIVE mg/dL
Leukocytes,Ua: NEGATIVE
Nitrite: NEGATIVE
Protein, ur: NEGATIVE mg/dL
Specific Gravity, Urine: 1.005 (ref 1.005–1.030)
WBC, UA: NONE SEEN WBC/hpf (ref 0–5)
pH: 5 (ref 5.0–8.0)

## 2023-03-18 LAB — CBC
HCT: 39 % (ref 36.0–46.0)
Hemoglobin: 12.1 g/dL (ref 12.0–15.0)
MCH: 30.4 pg (ref 26.0–34.0)
MCHC: 31 g/dL (ref 30.0–36.0)
MCV: 98 fL (ref 80.0–100.0)
Platelets: 188 10*3/uL (ref 150–400)
RBC: 3.98 MIL/uL (ref 3.87–5.11)
RDW: 13.1 % (ref 11.5–15.5)
WBC: 11.1 10*3/uL — ABNORMAL HIGH (ref 4.0–10.5)
nRBC: 0 % (ref 0.0–0.2)

## 2023-03-18 MED ORDER — METOPROLOL TARTRATE 25 MG PO TABS
25.0000 mg | ORAL_TABLET | Freq: Once | ORAL | Status: AC
  Administered 2023-03-18: 25 mg via ORAL
  Filled 2023-03-18: qty 1

## 2023-03-18 MED ORDER — SODIUM CHLORIDE 0.9 % IV BOLUS
1000.0000 mL | Freq: Once | INTRAVENOUS | Status: AC
  Administered 2023-03-18: 1000 mL via INTRAVENOUS

## 2023-03-18 NOTE — ED Triage Notes (Signed)
See first nurse note, pt to ED for not acting herself for past couple days. Pt denies complaints. Denies falls.  Pt disoriented to month only.

## 2023-03-18 NOTE — ED Notes (Signed)
First Nurse Note: Patient to ED via [facility] from [facility] for failure to thrive. Patient has been less responsive for the past 2-3 days. Has no complaints with EMS. Not like self per family.   EMS VS: 145/60 64 HR 98% RA 145 cbg

## 2023-03-18 NOTE — Discharge Instructions (Addendum)
You have been seen in the emergency department for medical evaluation today.  Your workup is show normal results including a normal CT scan of the head, normal lab work and a normal urine sample.  Please follow-up with your doctor within the next day or 2 for recheck/reevaluation.  Return to the emergency department for any symptom personally concerning to yourself or staff/family members

## 2023-03-18 NOTE — ED Provider Notes (Signed)
[facility] Provider Note    Event Date/Time   First MD Initiated Contact with Patient 03/18/23 1803     (approximate)  History   Chief Complaint: Altered Mental Status  HPI  Kimberly Hood is a 89 y.o. female with a past medical history of CAD/MI, prior CVA, presents emergency department for altered mental status.  According to EMS report patient is coming from [facility] nursing facility for being less responsive over the last several days.  Here the patient is awake alert she answers questions appropriately.  No known falls.  Patient is not able to provide any significant history.  Physical Exam   Triage Vital Signs: ED Triage Vitals  Enc Vitals Group     BP 03/18/23 1547 (!) 177/58     Pulse Rate 03/18/23 1547 (!) 59     Resp 03/18/23 1547 17     Temp 03/18/23 1547 97.7 F (36.5 C)     Temp src --      SpO2 03/18/23 1547 100 %     Weight 03/18/23 1549 104 lb (47.2 kg)     Height 03/18/23 1549 5\' 1"  (1.549 m)     Head Circumference --      Peak Flow --      Pain Score 03/18/23 1549 0     Pain Loc --      Pain Edu? --      Excl. in GC? --     Most recent vital signs: Vitals:   03/18/23 1547  BP: (!) 177/58  Pulse: (!) 59  Resp: 17  Temp: 97.7 F (36.5 C)  SpO2: 100%    General: Awake, no distress.  Answers questions appropriately.  Follows basic commands. CV:  Good peripheral perfusion.  Regular rate and rhythm.  Systolic murmur. Resp:  Normal effort.  Equal breath sounds bilaterally.  Abd:  No distention.  Soft, nontender.  No rebound or guarding.  ED Results / Procedures / Treatments   RADIOLOGY  I have reviewed and interpreted CT head images.  No consolidation seen on my evaluation. Radiology states no acute intracranial finding.   MEDICATIONS ORDERED IN ED: Medications - No data to display   IMPRESSION / MDM / ASSESSMENT AND PLAN / ED COURSE  I reviewed the triage vital signs and the nursing notes.  Patient's  presentation is most consistent with acute presentation with potential threat to life or bodily function.  Patient presents emergency department for altered mental state.  States the patient has been less responsive than typical for the past 2 to 3 days.  Here the patient is awake alert she is answering questions appropriately.  Awaiting family arrival for further history.  Patient's chemistry shows no significant findings, CBC reassuring.  We will check a urine sample to rule out urinary tract infection we will obtain a CT scan of the head to further evaluate.  Given the report of decreased responsiveness failure to thrive we will also IV hydrate while awaiting further results.  Patient agreeable to plan of care and has no acute complaints.  Patient CT scan of the head is negative.  CBC is reassuring, chemistry shows slight renal insufficiency otherwise reassuring findings.  Patient has received a liter of IV fluids.  Urinalysis shows no concerning findings or sign of infection.  Given the patient's reassuring workup as she is awake alert answering questions appropriately in the emergency department we will plan to discharge and have the patient follow-up with her doctor.  FINAL CLINICAL IMPRESSION(S) / ED DIAGNOSES   Weakness Altered mental status   Note:  This document was prepared using Dragon voice recognition software and may include unintentional dictation errors.   Minna Antis, MD 03/18/23 2052

## 2023-03-23 ENCOUNTER — Ambulatory Visit: Payer: Medicare Other | Attending: Internal Medicine | Admitting: Internal Medicine

## 2023-03-23 ENCOUNTER — Encounter: Payer: Self-pay | Admitting: Internal Medicine

## 2023-03-23 VITALS — BP 185/72 | HR 60 | Ht <= 58 in | Wt 104.0 lb

## 2023-03-23 DIAGNOSIS — Z950 Presence of cardiac pacemaker: Secondary | ICD-10-CM | POA: Insufficient documentation

## 2023-03-23 DIAGNOSIS — I441 Atrioventricular block, second degree: Secondary | ICD-10-CM | POA: Insufficient documentation

## 2023-03-23 LAB — CUP PACEART INCLINIC DEVICE CHECK
Battery Remaining Longevity: 51 mo
Battery Voltage: 3.01 V
Brady Statistic RA Percent Paced: 95 %
Brady Statistic RV Percent Paced: 0 %
Date Time Interrogation Session: 20240618133111
Implantable Lead Connection Status: 753985
Implantable Lead Connection Status: 753985
Implantable Lead Implant Date: 20180524
Implantable Lead Implant Date: 20180524
Implantable Lead Location: 753859
Implantable Lead Location: 753860
Implantable Lead Model: 3830
Implantable Pulse Generator Implant Date: 20180524
Lead Channel Impedance Value: 512.5 Ohm
Lead Channel Impedance Value: 537.5 Ohm
Lead Channel Pacing Threshold Amplitude: 0.75 V
Lead Channel Pacing Threshold Amplitude: 0.75 V
Lead Channel Pacing Threshold Amplitude: 0.75 V
Lead Channel Pacing Threshold Amplitude: 0.75 V
Lead Channel Pacing Threshold Pulse Width: 0.5 ms
Lead Channel Pacing Threshold Pulse Width: 0.5 ms
Lead Channel Pacing Threshold Pulse Width: 0.5 ms
Lead Channel Pacing Threshold Pulse Width: 0.5 ms
Lead Channel Sensing Intrinsic Amplitude: 2 mV
Lead Channel Sensing Intrinsic Amplitude: 9.2 mV
Lead Channel Setting Pacing Amplitude: 2 V
Lead Channel Setting Pacing Amplitude: 2.5 V
Lead Channel Setting Pacing Pulse Width: 0.5 ms
Lead Channel Setting Sensing Sensitivity: 2 mV
Pulse Gen Model: 2272
Pulse Gen Serial Number: 8911338

## 2023-03-23 NOTE — Progress Notes (Signed)
Patient Care Team: Lauro Regulus, MD as PCP - General (Internal Medicine) Duke Salvia, MD as PCP - Electrophysiology (Cardiology)   HPI  Kimberly Hood is a 89 y.o. female Seen in followup for pacemaker [employer] Jude  His Bundle)  Implanted (GT)  for syncope with bifascicular block (5/18) Prior hx of reported AFib with CVA previously on warfarin now Eliquis; no  bleeding.      After her last office visit, 5/22 in the absence of interval atrial fibrillation/flutter her amiodarone was discontinued.  And with hypotension, blood pressures 92/62, lisinopril was discontinued.  She ended up in the hospital 6/22 with atrial fibrillation and hypertension.  Eliquis was continued.  Rate control was accomplished with beta-blockers.  And lisinopril was resumed.  I had spoken with the daughter-in-law after the aforementioned events as they were concerned about the lack of follow-up following the discontinuation of these medications.  DIL also concerned of weight loss, but there has been recent rebound   The patient denies chest pain, shortness of breath, nocturnal dyspnea, orthopnea or peripheral edema.  There have been no palpitations, lightheadedness or syncope.  Complains of single hospital last week for this.  CT scanning was unrevealing. She was started on Seroquel last fall for visual hallucinations, this was also associated with profound fatigue and sleepiness.  The dose was cut in 2:30/24 with improvement in those symptoms. .   Assisted living in memory care  DATE TEST EF   5/18 Echo   60-65 % AS mild (mean grad 17)  6/22 Echo   60-65 % AS mild (mean grad 16)                Date Cr K Hgb WBC  5/18 1.19 4.6 13.9   6/19     9.3  6/21 1.71  13.8 13.3  6/22 1.04 4.5 11.6 10.5  6/24 1.18 5.3 12.1 11.1        Past Medical History:  Diagnosis Date   Heart attack (HCC)    Hypertension    MI (myocardial infarction) (HCC) 2009   Presence of permanent cardiac  pacemaker 02/24/2017   Stroke [facility])     Past Surgical History:  Procedure Laterality Date   ABDOMINAL HYSTERECTOMY     APPENDECTOMY     PACEMAKER IMPLANT N/A 02/25/2017   Procedure: Pacemaker Implant;  Surgeon: Marinus Maw, MD;  Location: [facility] INVASIVE CV LAB;  Service: Cardiovascular;  Laterality: N/A;   TONSILLECTOMY      Current Outpatient Medications  Medication Sig Dispense Refill   acetaminophen (TYLENOL) 500 MG tablet Take 500 mg by mouth every 6 (six) hours as needed.     apixaban (ELIQUIS) 2.5 MG TABS tablet Take 1 tablet (2.5 mg total) by mouth every other day. 15 tablet 6   carbidopa-levodopa (SINEMET IR) 25-100 MG tablet Take 1 tablet by mouth 4 (four) times daily.     cholecalciferol (VITAMIN D3) 25 MCG (1000 UNIT) tablet Take 1,000 Units by mouth at bedtime.     cholestyramine (QUESTRAN) 4 g packet Take 1 packet by mouth daily.     donepezil (ARICEPT) 10 MG tablet Take 10 mg by mouth at bedtime.     levothyroxine (SYNTHROID) 25 MCG tablet TAKE ONE TABLET (25 MCG) BY MOUTH EVERY DAY BEFORE BREAKFAST 30 tablet 6   lisinopril (ZESTRIL) 5 MG tablet Take 1 tablet (5 mg total) by mouth daily. 30 tablet 11   metoprolol tartrate (LOPRESSOR) 25 MG tablet  TAKE ONE-HALF TABLET BY MOUTH TWICE DAILY 30 tablet 4   potassium chloride SA (KLOR-CON) 20 MEQ tablet TAKE 1 TABLET BY MOUTH DAILY 30 tablet 3   QUEtiapine (SEROQUEL) 25 MG tablet Take 12.5 mg by mouth at bedtime.     simvastatin (ZOCOR) 40 MG tablet TAKE ONE TABLET AT BEDTIME 30 tablet 6   No current facility-administered medications for this visit.    No Known Allergies    Review of Systems negative except from HPI and PMH  Physical Exam\ BP (!) 180/60 (BP Location: Right Arm, Patient Position: Sitting, Cuff Size: Normal)   Pulse 60   Ht 4\' 9"  (1.448 m)   Wt 104 lb (47.2 kg)   SpO2 98%   BMI 22.51 kg/m  Well developed and well nourished in no acute distress HENT normal Neck supple with JVP-flat Clear Device  pocket well healed; without hematoma or erythema.  There is no tethering  Regular rate and rhythm,  gallop 3/6 murmur S2 is still split Abd-soft with active BS No Clubbing cyanosis  edema Skin-warm and dry A & affect relatively flat grossly normal sensory and motor function  ECG atrial pacing at 60 Normal 24/12/43 Right bundle branch block  Device function is normal. Programming changes none See Paceart for details     Assessment and plan  Sinus bradycardia//Bifascicular block right bundle branch left posterior fascicular block-intermittent   Syncope   Pacemaker [employer]    Atrial fibrillation    Stroke  Aortic stenosis mild  Hypertension  Anemia  Scant intercurrent atrial fibrillation with late.  Continue metoprolol.  No significant bleeding continue Eliquis dosed for age and weight  Device function is normal  Sleepiness is a huge concern.  With the abruptness of his onset, wonder whether she has had a stroke and that this is the residual.  I suggested that they follow-up with Dr. Arva Chafe.  I also wonder whether this could be an effect of the Seroquel   Repeat blood pressure remains very elevated.  Reviewed the blood pressure vital signs record from [facility], blood pressure 2 times ago 4/24 was 105.  She has had history of labile blood pressures before having been seen in the office a few years with a blood pressure of 92.  Discussion with her niece and we have elected to do nothing considering that a fall is of greater immediate health consequences and elevated blood pressure.  She is aware of the risk of hemorrhagic bleeding as a consequence of this with her anticoagulation and that this would likely be a more catastrophic event then a fall consistent with end-of-life hopes for living well and then dying quickly

## 2023-03-23 NOTE — Patient Instructions (Signed)
Medication Instructions:  Your physician recommends that you continue on your current medications as directed. Please refer to the Current Medication list given to you today.  *If you need a refill on your cardiac medications before your next appointment, please call your pharmacy*  Follow-Up: At [facility], you and your health needs are our priority.  As part of our continuing mission to provide you with exceptional heart care, we have created designated Provider Care Teams.  These Care Teams include your primary Cardiologist (physician) and Advanced Practice Providers (APPs -  Physician Assistants and Nurse Practitioners) who all work together to provide you with the care you need, when you need it.   Your next appointment:   1 year  Provider:   You may see Sherryl Manges, MD or one of the following Advanced Practice Providers on your designated Care Team:   Francis Dowse, [place] "Mardelle Matte" [place], [place] Sherie Don, NP

## 2023-03-24 NOTE — Progress Notes (Signed)
Remote pacemaker transmission.   

## 2023-06-01 ENCOUNTER — Ambulatory Visit (INDEPENDENT_AMBULATORY_CARE_PROVIDER_SITE_OTHER): Payer: Medicare Other

## 2023-06-01 DIAGNOSIS — I441 Atrioventricular block, second degree: Secondary | ICD-10-CM | POA: Diagnosis not present

## 2023-06-01 LAB — CUP PACEART REMOTE DEVICE CHECK
Battery Remaining Longevity: 46 mo
Battery Remaining Percentage: 40 %
Battery Voltage: 3.01 V
Brady Statistic AP VP Percent: 1 %
Brady Statistic AP VS Percent: 97 %
Brady Statistic AS VP Percent: 1 %
Brady Statistic AS VS Percent: 2.5 %
Brady Statistic RA Percent Paced: 97 %
Brady Statistic RV Percent Paced: 1 %
Date Time Interrogation Session: 20240827020013
Implantable Lead Connection Status: 753985
Implantable Lead Connection Status: 753985
Implantable Lead Implant Date: 20180524
Implantable Lead Implant Date: 20180524
Implantable Lead Location: 753859
Implantable Lead Location: 753860
Implantable Lead Model: 3830
Implantable Pulse Generator Implant Date: 20180524
Lead Channel Impedance Value: 490 Ohm
Lead Channel Impedance Value: 490 Ohm
Lead Channel Pacing Threshold Amplitude: 0.75 V
Lead Channel Pacing Threshold Amplitude: 0.75 V
Lead Channel Pacing Threshold Pulse Width: 0.5 ms
Lead Channel Pacing Threshold Pulse Width: 0.5 ms
Lead Channel Sensing Intrinsic Amplitude: 2.4 mV
Lead Channel Sensing Intrinsic Amplitude: 8.7 mV
Lead Channel Setting Pacing Amplitude: 2 V
Lead Channel Setting Pacing Amplitude: 2.5 V
Lead Channel Setting Pacing Pulse Width: 0.5 ms
Lead Channel Setting Sensing Sensitivity: 2 mV
Pulse Gen Model: 2272
Pulse Gen Serial Number: 8911338

## 2023-06-14 NOTE — Progress Notes (Signed)
Remote pacemaker transmission.   

## 2023-07-15 ENCOUNTER — Emergency Department: Payer: Medicare Other

## 2023-07-15 ENCOUNTER — Emergency Department
Admission: EM | Admit: 2023-07-15 | Discharge: 2023-07-15 | Disposition: A | Discharge status: Home / Self Care | Payer: Medicare Other | Attending: Emergency Medicine | Admitting: Emergency Medicine

## 2023-07-15 ENCOUNTER — Other Ambulatory Visit: Payer: Self-pay

## 2023-07-15 DIAGNOSIS — S00202A Unspecified superficial injury of left eyelid and periocular area, initial encounter: Secondary | ICD-10-CM | POA: Diagnosis present

## 2023-07-15 DIAGNOSIS — Z23 Encounter for immunization: Secondary | ICD-10-CM | POA: Insufficient documentation

## 2023-07-15 DIAGNOSIS — S51811A Laceration without foreign body of right forearm, initial encounter: Secondary | ICD-10-CM | POA: Diagnosis not present

## 2023-07-15 DIAGNOSIS — I2510 Atherosclerotic heart disease of native coronary artery without angina pectoris: Secondary | ICD-10-CM | POA: Insufficient documentation

## 2023-07-15 DIAGNOSIS — I4891 Unspecified atrial fibrillation: Secondary | ICD-10-CM | POA: Diagnosis not present

## 2023-07-15 DIAGNOSIS — Z7901 Long term (current) use of anticoagulants: Secondary | ICD-10-CM | POA: Diagnosis not present

## 2023-07-15 DIAGNOSIS — I10 Essential (primary) hypertension: Secondary | ICD-10-CM | POA: Insufficient documentation

## 2023-07-15 DIAGNOSIS — S01112A Laceration without foreign body of left eyelid and periocular area, initial encounter: Secondary | ICD-10-CM | POA: Diagnosis not present

## 2023-07-15 DIAGNOSIS — W19XXXA Unspecified fall, initial encounter: Secondary | ICD-10-CM

## 2023-07-15 DIAGNOSIS — F0390 Unspecified dementia without behavioral disturbance: Secondary | ICD-10-CM | POA: Diagnosis not present

## 2023-07-15 DIAGNOSIS — T148XXA Other injury of unspecified body region, initial encounter: Secondary | ICD-10-CM

## 2023-07-15 LAB — BASIC METABOLIC PANEL
Anion gap: 9 (ref 5–15)
BUN: 15 mg/dL (ref 8–23)
CO2: 21 mmol/L — ABNORMAL LOW (ref 22–32)
Calcium: 8.5 mg/dL — ABNORMAL LOW (ref 8.9–10.3)
Chloride: 101 mmol/L (ref 98–111)
Creatinine, Ser: 1.1 mg/dL — ABNORMAL HIGH (ref 0.44–1.00)
GFR, Estimated: 46 mL/min — ABNORMAL LOW (ref >60)
Glucose, Bld: 108 mg/dL — ABNORMAL HIGH (ref 70–99)
Potassium: 5 mmol/L (ref 3.5–5.1)
Sodium: 131 mmol/L — ABNORMAL LOW (ref 135–145)

## 2023-07-15 LAB — CBC
HCT: 44.5 % (ref 36.0–46.0)
Hemoglobin: 14.5 g/dL (ref 12.0–15.0)
MCH: 30.7 pg (ref 26.0–34.0)
MCHC: 32.6 g/dL (ref 30.0–36.0)
MCV: 94.1 fL (ref 80.0–100.0)
Platelets: 164 10*3/uL (ref 150–400)
RBC: 4.73 MIL/uL (ref 3.87–5.11)
RDW: 13.6 % (ref 11.5–15.5)
WBC: 14 10*3/uL — ABNORMAL HIGH (ref 4.0–10.5)
nRBC: 0 % (ref 0.0–0.2)

## 2023-07-15 LAB — URINALYSIS, ROUTINE W REFLEX MICROSCOPIC
Bacteria, UA: NONE SEEN
Bilirubin Urine: NEGATIVE
Glucose, UA: NEGATIVE mg/dL
Ketones, ur: 5 mg/dL — AB
Leukocytes,Ua: NEGATIVE
Nitrite: NEGATIVE
Protein, ur: NEGATIVE mg/dL
Specific Gravity, Urine: 1.014 (ref 1.005–1.030)
Squamous Epithelial / HPF: 0 /[HPF] (ref 0–5)
pH: 5 (ref 5.0–8.0)

## 2023-07-15 MED ORDER — TETANUS-DIPHTH-ACELL PERTUSSIS 5-2.5-18.5 LF-MCG/0.5 IM SUSY
0.5000 mL | PREFILLED_SYRINGE | Freq: Once | INTRAMUSCULAR | Status: AC
  Administered 2023-07-15: 0.5 mL via INTRAMUSCULAR
  Filled 2023-07-15: qty 0.5

## 2023-07-15 NOTE — ED Notes (Signed)
MD Jessup notified of patient's elevated BP.

## 2023-07-15 NOTE — ED Triage Notes (Signed)
First nurse note: pt to ED [facility] from [facility] for fall x2 this am. Laceration to left forehead. Hx dementia.

## 2023-07-15 NOTE — ED Notes (Signed)
Non-adhesive dressings applied to patient's L eye and R arm lacerations.

## 2023-07-15 NOTE — ED Provider Notes (Signed)
[facility] Provider Note    Event Date/Time   First MD Initiated Contact with Patient 07/15/23 1311     (approximate)   History   Chief Complaint Fall   HPI  Kimberly Hood is a 89 y.o. female with past medical history of hypertension, CAD, stroke, atrial fibrillation on Eliquis, and dementia who presents to the ED following fall.  Per EMS, patient fell twice at her nursing facility this morning, struck her head at least once and has a laceration above her left eyebrow.  It is unclear whether patient lost consciousness, patient does not recall the context of the falls.  She complains only of pain to her right forearm, denies any other areas of pain or other complaints.     Physical Exam   Triage Vital Signs: ED Triage Vitals  Encounter Vitals Group     BP 07/15/23 1120 (!) 187/63     Systolic BP Percentile --      Diastolic BP Percentile --      Pulse Rate 07/15/23 1120 60     Resp 07/15/23 1120 19     Temp 07/15/23 1120 98 F (36.7 C)     Temp Source 07/15/23 1120 Oral     SpO2 07/15/23 1120 100 %     Weight 07/15/23 1133 110 lb (49.9 kg)     Height 07/15/23 1133 5' (1.524 m)     Head Circumference --      Peak Flow --      Pain Score 07/15/23 1133 0     Pain Loc --      Pain Education --      Exclude from Growth Chart --     Most recent vital signs: Vitals:   07/15/23 1120 07/15/23 1430  BP: (!) 187/63 (!) 209/61  Pulse: 60   Resp: 19 18  Temp: 98 F (36.7 C)   SpO2: 100%     Constitutional: Alert and oriented to person and place, but not time or situation. Eyes: Conjunctivae are normal. Head: Skin tear at left eyebrow, no hematoma or laceration noted. Nose: No congestion/rhinnorhea. Mouth/Throat: Mucous membranes are moist.  Neck: No midline cervical spine tenderness to palpation. Cardiovascular: Normal rate, regular rhythm. Grossly normal heart sounds.  2+ radial pulses bilaterally. Respiratory: Normal respiratory  effort.  No retractions. Lungs CTAB.  No chest wall tenderness to palpation. Gastrointestinal: Soft and nontender. No distention. Musculoskeletal: No lower extremity tenderness nor edema.  Skin tear to right forearm with no bony tenderness, range of motion intact to all 4 extremities. Neurologic:  Normal speech and language. No gross focal neurologic deficits are appreciated.    ED Results / Procedures / Treatments   Labs (all labs ordered are listed, but only abnormal results are displayed) Labs Reviewed  BASIC METABOLIC PANEL - Abnormal; Notable for the following components:      Result Value   Sodium 131 (*)    CO2 21 (*)    Glucose, Bld 108 (*)    Creatinine, Ser 1.10 (*)    Calcium 8.5 (*)    GFR, Estimated 46 (*)    All other components within normal limits  CBC - Abnormal; Notable for the following components:   WBC 14.0 (*)    All other components within normal limits  URINALYSIS, ROUTINE W REFLEX MICROSCOPIC - Abnormal; Notable for the following components:   Color, Urine YELLOW (*)    APPearance CLEAR (*)    Hgb urine dipstick MODERATE (*)  Ketones, ur 5 (*)    All other components within normal limits     EKG  ED ECG REPORT I, Chesley Noon, the attending physician, personally viewed and interpreted this ECG.   Date: 07/15/2023  EKG Time: 11:41  Rate: 60  Rhythm: Atrial paced rhythm  Axis: Normal  Intervals:right bundle branch block  ST&T Change: None  RADIOLOGY CT head reviewed and interpreted by me with no hemorrhage or midline shift.  PROCEDURES:  Critical Care performed: No  Procedures   MEDICATIONS ORDERED IN ED: Medications  Tdap (BOOSTRIX) injection 0.5 mL (0.5 mLs Intramuscular Given 07/15/23 1434)     IMPRESSION / MDM / ASSESSMENT AND PLAN / ED COURSE  I reviewed the triage vital signs and the nursing notes.                              89 y.o. female with past medical history of hypertension, CAD, stroke, atrial fibrillation  on Eliquis, and dementia who presents to the ED with multiple falls this morning, now with cut above her left eyebrow.  Patient's presentation is most consistent with acute presentation with potential threat to life or bodily function.  Differential diagnosis includes, but is not limited to, intracranial injury, cervical spine injury, facial laceration, UTI, electrolyte abnormality, AKI, anemia.  Patient nontoxic-appearing and in no acute distress, vital signs are unremarkable.  EKG shows no evidence arrhythmia or ischemia, patient appears to be at her baseline mental status with no focal neurologic deficits.  CT head and cervical spine are negative for acute process  Patient does complain of pain to her right forearm, where she has a large skin tear, but there is no evidence of bony injury to her extremities or traumatic injury to her trunk.  Labs show mild leukocytosis, renal function stable compared to previous with no acute electrolyte abnormality.  Given her multiple falls, we will check urinalysis.  Skin tear around her left eyebrow is not in need of repair, will place dressing.  Urinalysis shows no signs of infection, patient remains at her baseline mental status.  She is appropriate for discharge home with outpatient follow-up, nonadherent dressings placed over her skin tears.  Patient counseled to return to the ED for new or worsening symptoms, patient agrees with plan.      FINAL CLINICAL IMPRESSION(S) / ED DIAGNOSES   Final diagnoses:  Fall, initial encounter  Multiple skin tears     Rx / DC Orders   ED Discharge Orders     None        Note:  This document was prepared using Dragon voice recognition software and may include unintentional dictation errors.   Chesley Noon, MD 07/15/23 1515

## 2023-07-15 NOTE — ED Triage Notes (Signed)
Pt arrived via EMS from [place]. PT fell twice this AM and has a laceration on the upper left eye brow. Pt is on eliquis. Pt has hx of dementia

## 2023-08-31 ENCOUNTER — Ambulatory Visit (INDEPENDENT_AMBULATORY_CARE_PROVIDER_SITE_OTHER)

## 2023-08-31 DIAGNOSIS — I441 Atrioventricular block, second degree: Secondary | ICD-10-CM

## 2023-08-31 LAB — CUP PACEART REMOTE DEVICE CHECK
Battery Remaining Longevity: 43 mo
Battery Remaining Percentage: 38 %
Battery Voltage: 3.01 V
Brady Statistic AP VP Percent: 1 %
Brady Statistic AP VS Percent: 96 %
Brady Statistic AS VP Percent: 1 %
Brady Statistic AS VS Percent: 3.6 %
Brady Statistic RA Percent Paced: 96 %
Brady Statistic RV Percent Paced: 1 %
Date Time Interrogation Session: 20241126020011
Implantable Lead Connection Status: 753985
Implantable Lead Connection Status: 753985
Implantable Lead Implant Date: 20180524
Implantable Lead Implant Date: 20180524
Implantable Lead Location: 753859
Implantable Lead Location: 753860
Implantable Lead Model: 3830
Implantable Pulse Generator Implant Date: 20180524
Lead Channel Impedance Value: 460 Ohm
Lead Channel Impedance Value: 460 Ohm
Lead Channel Pacing Threshold Amplitude: 0.75 V
Lead Channel Pacing Threshold Amplitude: 0.75 V
Lead Channel Pacing Threshold Pulse Width: 0.5 ms
Lead Channel Pacing Threshold Pulse Width: 0.5 ms
Lead Channel Sensing Intrinsic Amplitude: 1.6 mV
Lead Channel Sensing Intrinsic Amplitude: 7.6 mV
Lead Channel Setting Pacing Amplitude: 2 V
Lead Channel Setting Pacing Amplitude: 2.5 V
Lead Channel Setting Pacing Pulse Width: 0.5 ms
Lead Channel Setting Sensing Sensitivity: 2 mV
Pulse Gen Model: 2272
Pulse Gen Serial Number: 8911338

## 2023-09-22 ENCOUNTER — Encounter: Payer: Self-pay | Admitting: Internal Medicine

## 2023-09-30 NOTE — Progress Notes (Signed)
Remote pacemaker transmission.   

## 2023-10-06 DEATH — deceased

## 2023-10-27 ENCOUNTER — Encounter: Payer: Self-pay | Admitting: Internal Medicine

## 2023-11-30 ENCOUNTER — Ambulatory Visit: Payer: Medicare Other

## 2024-02-29 ENCOUNTER — Ambulatory Visit: Payer: Medicare Other

## 2024-05-30 ENCOUNTER — Ambulatory Visit: Payer: Medicare Other

## 2024-08-29 ENCOUNTER — Ambulatory Visit: Payer: Medicare Other

## 2024-11-28 ENCOUNTER — Ambulatory Visit: Payer: Medicare Other
